# Patient Record
Sex: Male | Born: 1960 | Race: Black or African American | Hispanic: No | Marital: Married | Smoking: Current every day smoker
Health system: Southern US, Community
[De-identification: ages and names within clinical notes are randomized; demographics above are authoritative.]

## PROBLEM LIST (undated history)

## (undated) DIAGNOSIS — F39 Unspecified mood [affective] disorder: Secondary | ICD-10-CM

## (undated) DIAGNOSIS — F1411 Cocaine abuse, in remission: Secondary | ICD-10-CM

## (undated) DIAGNOSIS — I209 Angina pectoris, unspecified: Secondary | ICD-10-CM

## (undated) DIAGNOSIS — B1910 Unspecified viral hepatitis B without hepatic coma: Secondary | ICD-10-CM

## (undated) DIAGNOSIS — I10 Essential (primary) hypertension: Secondary | ICD-10-CM

## (undated) DIAGNOSIS — F1011 Alcohol abuse, in remission: Secondary | ICD-10-CM

## (undated) DIAGNOSIS — I639 Cerebral infarction, unspecified: Secondary | ICD-10-CM

## (undated) DIAGNOSIS — E7800 Pure hypercholesterolemia, unspecified: Secondary | ICD-10-CM

## (undated) DIAGNOSIS — Z720 Tobacco use: Secondary | ICD-10-CM

---

## 1989-09-11 DIAGNOSIS — B1910 Unspecified viral hepatitis B without hepatic coma: Secondary | ICD-10-CM

## 1989-09-11 HISTORY — DX: Unspecified viral hepatitis B without hepatic coma: B19.10

## 1999-04-28 ENCOUNTER — Encounter: Payer: Self-pay | Admitting: Emergency Medicine

## 1999-04-28 ENCOUNTER — Emergency Department (HOSPITAL_COMMUNITY): Admission: EM | Admit: 1999-04-28 | Discharge: 1999-04-28 | Payer: Self-pay | Admitting: Emergency Medicine

## 1999-10-30 ENCOUNTER — Emergency Department (HOSPITAL_COMMUNITY): Admission: EM | Admit: 1999-10-30 | Discharge: 1999-10-30 | Payer: Self-pay | Admitting: Emergency Medicine

## 1999-10-30 ENCOUNTER — Encounter: Payer: Self-pay | Admitting: Emergency Medicine

## 2000-03-10 ENCOUNTER — Encounter: Payer: Self-pay | Admitting: Emergency Medicine

## 2000-03-10 ENCOUNTER — Emergency Department (HOSPITAL_COMMUNITY): Admission: EM | Admit: 2000-03-10 | Discharge: 2000-03-10 | Payer: Self-pay | Admitting: Emergency Medicine

## 2000-03-21 ENCOUNTER — Encounter: Payer: Self-pay | Admitting: Emergency Medicine

## 2000-03-21 ENCOUNTER — Inpatient Hospital Stay (HOSPITAL_COMMUNITY): Admission: EM | Admit: 2000-03-21 | Discharge: 2000-03-22 | Payer: Self-pay | Admitting: Emergency Medicine

## 2003-05-21 ENCOUNTER — Ambulatory Visit (HOSPITAL_COMMUNITY): Admission: RE | Admit: 2003-05-21 | Discharge: 2003-05-21 | Payer: Self-pay | Admitting: Internal Medicine

## 2003-05-21 ENCOUNTER — Encounter: Payer: Self-pay | Admitting: Internal Medicine

## 2004-01-13 ENCOUNTER — Emergency Department (HOSPITAL_COMMUNITY): Admission: EM | Admit: 2004-01-13 | Discharge: 2004-01-13 | Payer: Self-pay | Admitting: Emergency Medicine

## 2005-05-08 ENCOUNTER — Emergency Department (HOSPITAL_COMMUNITY): Admission: EM | Admit: 2005-05-08 | Discharge: 2005-05-09 | Payer: Self-pay | Admitting: Emergency Medicine

## 2005-09-15 ENCOUNTER — Emergency Department (HOSPITAL_COMMUNITY): Admission: EM | Admit: 2005-09-15 | Discharge: 2005-09-15 | Payer: Self-pay | Admitting: Emergency Medicine

## 2005-10-06 ENCOUNTER — Inpatient Hospital Stay (HOSPITAL_COMMUNITY): Admission: AC | Admit: 2005-10-06 | Discharge: 2005-10-10 | Payer: Self-pay

## 2005-10-13 ENCOUNTER — Emergency Department (HOSPITAL_COMMUNITY): Admission: EM | Admit: 2005-10-13 | Discharge: 2005-10-13 | Payer: Self-pay | Admitting: Emergency Medicine

## 2005-11-24 ENCOUNTER — Inpatient Hospital Stay (HOSPITAL_COMMUNITY): Admission: RE | Admit: 2005-11-24 | Discharge: 2005-11-27 | Payer: Self-pay | Admitting: Psychiatry

## 2005-11-25 ENCOUNTER — Ambulatory Visit: Payer: Self-pay | Admitting: Psychiatry

## 2006-08-02 ENCOUNTER — Emergency Department (HOSPITAL_COMMUNITY): Admission: EM | Admit: 2006-08-02 | Discharge: 2006-08-03 | Payer: Self-pay | Admitting: Emergency Medicine

## 2006-09-05 ENCOUNTER — Emergency Department (HOSPITAL_COMMUNITY): Admission: EM | Admit: 2006-09-05 | Discharge: 2006-09-06 | Payer: Self-pay | Admitting: Emergency Medicine

## 2007-04-04 ENCOUNTER — Emergency Department (HOSPITAL_COMMUNITY): Admission: EM | Admit: 2007-04-04 | Discharge: 2007-04-04 | Payer: Self-pay | Admitting: Emergency Medicine

## 2007-10-27 ENCOUNTER — Emergency Department (HOSPITAL_COMMUNITY): Admission: EM | Admit: 2007-10-27 | Discharge: 2007-10-27 | Payer: Self-pay | Admitting: Emergency Medicine

## 2007-10-30 ENCOUNTER — Encounter: Admission: RE | Admit: 2007-10-30 | Discharge: 2007-11-27 | Payer: Self-pay | Admitting: Psychiatry

## 2009-04-04 ENCOUNTER — Emergency Department (HOSPITAL_COMMUNITY): Admission: EM | Admit: 2009-04-04 | Discharge: 2009-04-04 | Payer: Self-pay | Admitting: Emergency Medicine

## 2009-09-11 HISTORY — PX: MOUTH SURGERY: SHX715

## 2010-04-06 ENCOUNTER — Inpatient Hospital Stay (HOSPITAL_COMMUNITY): Admission: AD | Admit: 2010-04-06 | Discharge: 2010-04-09 | Payer: Self-pay | Admitting: Psychiatry

## 2010-04-06 ENCOUNTER — Ambulatory Visit: Payer: Self-pay | Admitting: Psychiatry

## 2010-04-06 ENCOUNTER — Emergency Department (HOSPITAL_COMMUNITY): Admission: EM | Admit: 2010-04-06 | Discharge: 2010-04-06 | Payer: Self-pay | Admitting: Emergency Medicine

## 2010-11-26 LAB — HEPATIC FUNCTION PANEL
ALT: 19 U/L (ref 0–53)
Alkaline Phosphatase: 64 U/L (ref 39–117)
Total Protein: 7.5 g/dL (ref 6.0–8.3)

## 2010-11-26 LAB — CBC
MCHC: 35 g/dL (ref 30.0–36.0)
WBC: 9.3 10*3/uL (ref 4.0–10.5)

## 2010-11-26 LAB — URINALYSIS, ROUTINE W REFLEX MICROSCOPIC
Bilirubin Urine: NEGATIVE
Hgb urine dipstick: NEGATIVE
Ketones, ur: NEGATIVE mg/dL
Protein, ur: NEGATIVE mg/dL
Urobilinogen, UA: 0.2 mg/dL (ref 0.0–1.0)
pH: 7 (ref 5.0–8.0)

## 2010-11-26 LAB — DIFFERENTIAL
Basophils Relative: 0 % (ref 0–1)
Eosinophils Absolute: 0 10*3/uL (ref 0.0–0.7)
Eosinophils Relative: 0 % (ref 0–5)
Lymphocytes Relative: 19 % (ref 12–46)
Monocytes Absolute: 0.4 10*3/uL (ref 0.1–1.0)
Neutro Abs: 7.1 10*3/uL (ref 1.7–7.7)
Neutrophils Relative %: 76 % (ref 43–77)

## 2010-11-26 LAB — BASIC METABOLIC PANEL
CO2: 25 mEq/L (ref 19–32)
Chloride: 104 mEq/L (ref 96–112)
GFR calc non Af Amer: >60 mL/min (ref >60)
Glucose, Bld: 96 mg/dL (ref 70–99)
Potassium: 4 mEq/L (ref 3.5–5.1)
Sodium: 138 mEq/L (ref 135–145)

## 2010-11-26 LAB — RAPID URINE DRUG SCREEN, HOSP PERFORMED
Cocaine: POSITIVE — AB
Opiates: NOT DETECTED
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
## 2012-03-27 ENCOUNTER — Encounter (HOSPITAL_COMMUNITY): Payer: Self-pay | Admitting: Emergency Medicine

## 2012-03-27 ENCOUNTER — Encounter (HOSPITAL_COMMUNITY): Payer: Self-pay | Admitting: *Deleted

## 2012-03-27 ENCOUNTER — Emergency Department (INDEPENDENT_AMBULATORY_CARE_PROVIDER_SITE_OTHER)
Admission: EM | Admit: 2012-03-27 | Discharge: 2012-03-27 | Disposition: A | Discharge status: Nursing Home (Includes ICF) | Payer: Self-pay | Source: Home / Self Care

## 2012-03-27 ENCOUNTER — Emergency Department (HOSPITAL_COMMUNITY)
Admission: EM | Admit: 2012-03-27 | Discharge: 2012-03-28 | Disposition: A | Discharge status: Home / Self Care | Payer: Self-pay | Attending: Emergency Medicine | Admitting: Emergency Medicine

## 2012-03-27 DIAGNOSIS — F17200 Nicotine dependence, unspecified, uncomplicated: Secondary | ICD-10-CM | POA: Insufficient documentation

## 2012-03-27 DIAGNOSIS — R531 Weakness: Secondary | ICD-10-CM

## 2012-03-27 DIAGNOSIS — I10 Essential (primary) hypertension: Secondary | ICD-10-CM | POA: Insufficient documentation

## 2012-03-27 DIAGNOSIS — R51 Headache: Secondary | ICD-10-CM | POA: Insufficient documentation

## 2012-03-27 HISTORY — DX: Essential (primary) hypertension: I10

## 2012-03-27 NOTE — ED Provider Notes (Signed)
History     CSN: [id number]  Arrival date & time 03/27/12  1421   None     Chief Complaint  Patient presents with  . Fatigue    (Consider location/radiation/quality/duration/timing/severity/associated sxs/prior treatment) The history is provided by the patient.  Patient complains of a 3 day history of bilateral temporal headache. Character:  Throbbing Location:  Aggravating activities:  none Alleviating activities:  Supine, quiet No history of injury.  Not worst headache of life.  Does not awaken from sleep.  No radiation, + dizziness,  -syncope, -LOC, -diplopia, -loss of vision, aura, photophobia, but does report blurred vision.  +nausea/vomiting  No extremity weakness, +numbness down right arm, -paresthesias, +fatigue.  Reports hx of hypertension for greater than ten years, has not been on medication in five years, took blood pressure over the weekend it and it was 180's/100's  Past Medical History  Diagnosis Date  . Hypertension     History reviewed. No pertinent past surgical history.  No family history on file.  History  Substance Use Topics  . Smoking status: Current Everyday Smoker  . Smokeless tobacco: Not on file  . Alcohol Use: Yes      Review of Systems  All other systems reviewed and are negative.    Allergies  Review of patient's allergies indicates no known allergies.  Home Medications  No current outpatient prescriptions on file.  BP 155/95  Pulse 71  Temp 98.5 F (36.9 C) (Oral)  Resp 16  SpO2 98%  Physical Exam  Nursing note and vitals reviewed. Constitutional: He is oriented to person, place, and time. Vital signs are normal. He appears well-developed and well-nourished. He is active and cooperative.  HENT:  Head: Normocephalic.  Eyes: Conjunctivae and EOM are normal. Pupils are equal, round, and reactive to light. No scleral icterus.  Neck: Trachea normal. Neck supple.  Cardiovascular: Normal rate, regular rhythm and normal pulses.    Murmur heard. Pulmonary/Chest: Effort normal and breath sounds normal.  Abdominal: Soft. Normal appearance and bowel sounds are normal. There is no tenderness.  Musculoskeletal:       Cervical back: Normal.  Lymphadenopathy:    He has no cervical adenopathy.  Neurological: He is alert and oriented to person, place, and time. He has normal strength. No cranial nerve deficit or sensory deficit. He displays a negative Romberg sign. GCS eye subscore is 4. GCS verbal subscore is 5. GCS motor subscore is 6.  Skin: Skin is warm and dry.  Psychiatric: He has a normal mood and affect. His speech is normal and behavior is normal. Judgment and thought content normal. Cognition and memory are normal.    ED Course  Procedures (including critical care time)  Labs Reviewed - No data to display No results found.   1. Headache       MDM  Transfer to [facility] for further evaluation and treatment of headache, dizziness, fatigue and n/v.        Johnsie Kindred, NP 03/27/12 1745

## 2012-03-27 NOTE — ED Notes (Signed)
Pt. Called for re assessment of vitals, no answer.

## 2012-03-27 NOTE — ED Notes (Signed)
PT reports a several week HX of HA and dizziness. Pt reports he has  Not passed out.. Pt denies vision changes.

## 2012-03-27 NOTE — ED Notes (Addendum)
Pt. C/o of headaches and dizziness for 2 weeks. Reports nv, diaphoresis blurred vision (has new eye wear). Also reports has had some chest tightness sometimes but currently does not have any chest pain. Hx of htn; has  Not had meds for over 1 year. Headache pain 6/10. Pt. Is very lethargic.
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
## 2012-03-27 NOTE — ED Provider Notes (Signed)
Medical screening examination/treatment/procedure(s) were performed by non-physician practitioner and as supervising physician I was immediately available for consultation/collaboration.  Raynald Blend, MD 03/27/12 2138

## 2012-03-28 ENCOUNTER — Encounter (HOSPITAL_COMMUNITY): Payer: Self-pay | Admitting: Radiology

## 2012-03-28 ENCOUNTER — Emergency Department (HOSPITAL_COMMUNITY): Payer: Self-pay

## 2012-03-28 LAB — CBC
HCT: 41.8 % (ref 39.0–52.0)
Hemoglobin: 14.4 g/dL (ref 13.0–17.0)
MCH: 31.9 pg (ref 26.0–34.0)
MCHC: 34.4 g/dL (ref 30.0–36.0)
MCV: 92.7 fL (ref 78.0–100.0)
Platelets: 228 K/uL (ref 150–400)
RBC: 4.51 MIL/uL (ref 4.22–5.81)
RDW: 13.6 % (ref 11.5–15.5)
WBC: 10.9 K/uL — ABNORMAL HIGH (ref 4.0–10.5)

## 2012-03-28 LAB — COMPREHENSIVE METABOLIC PANEL WITH GFR
ALT: 13 U/L (ref 0–53)
AST: 23 U/L (ref 0–37)
Albumin: 3.8 g/dL (ref 3.5–5.2)
Alkaline Phosphatase: 65 U/L (ref 39–117)
BUN: 9 mg/dL (ref 6–23)
CO2: 28 meq/L (ref 19–32)
Calcium: 9.6 mg/dL (ref 8.4–10.5)
Chloride: 101 meq/L (ref 96–112)
Creatinine, Ser: 0.98 mg/dL (ref 0.50–1.35)
GFR calc Af Amer: >90 mL/min
GFR calc non Af Amer: >90 mL/min
Glucose, Bld: 104 mg/dL — ABNORMAL HIGH (ref 70–99)
Potassium: 3.6 meq/L (ref 3.5–5.1)
Sodium: 139 meq/L (ref 135–145)
Total Bilirubin: 0.5 mg/dL (ref 0.3–1.2)
Total Protein: 7.6 g/dL (ref 6.0–8.3)

## 2012-03-28 LAB — RAPID URINE DRUG SCREEN, HOSP PERFORMED
Amphetamines: NOT DETECTED
Barbiturates: NOT DETECTED
Benzodiazepines: NOT DETECTED
Cocaine: POSITIVE — AB
Opiates: NOT DETECTED
Tetrahydrocannabinol: POSITIVE — AB

## 2012-03-28 MED ORDER — LISINOPRIL 20 MG PO TABS: 10.0000 mg | ORAL_TABLET | Freq: Every day | ORAL | Status: DC

## 2012-03-28 NOTE — ED Notes (Signed)
Pt. Returned from ct.

## 2012-03-28 NOTE — ED Provider Notes (Signed)
History     CSN: [id number]  Arrival date & time 03/27/12  1803   First MD Initiated Contact with Patient 03/28/12 0004      Chief Complaint  Patient presents with  . Headache  . Dizziness    (Consider location/radiation/quality/duration/timing/severity/associated sxs/prior treatment) Patient is a 51 y.o. male presenting with headaches. The history is provided by the patient.  Headache  This is a new problem. The current episode started 2 days ago. The headache is associated with nothing. The pain is located in the bilateral region. The quality of the pain is described as dull. The pain is at a severity of 2/10. The pain is mild. The pain does not radiate. Associated symptoms include malaise/fatigue. He has tried nothing for the symptoms.    Past Medical History  Diagnosis Date  . Hypertension     History reviewed. No pertinent past surgical history.  No family history on file.  History  Substance Use Topics  . Smoking status: Current Everyday Smoker  . Smokeless tobacco: Not on file  . Alcohol Use: Yes      Review of Systems  Constitutional: Positive for malaise/fatigue.  Neurological: Positive for headaches.  All other systems reviewed and are negative.    Allergies  Review of patient's allergies indicates no known allergies.  Home Medications  No current outpatient prescriptions on file.  BP 142/87  Pulse 58  Temp 98.4 F (36.9 C) (Oral)  Resp 14  SpO2 95%  Physical Exam  Constitutional: He is oriented to person, place, and time. He appears well-developed and well-nourished.  HENT:  Head: Normocephalic and atraumatic.  Eyes: Conjunctivae are normal. Pupils are equal, round, and reactive to light.  Neck: Normal range of motion. Neck supple.       No meningismus   Cardiovascular: Normal rate, regular rhythm, normal heart sounds and intact distal pulses.   Pulmonary/Chest: Effort normal and breath sounds normal.  Abdominal: Soft. Bowel sounds are  normal.  Neurological: He is alert and oriented to person, place, and time.  Skin: Skin is warm and dry.  Psychiatric: He has a normal mood and affect. His behavior is normal. Judgment and thought content normal.    ED Course  Procedures (including critical care time)   Labs Reviewed  CBC  COMPREHENSIVE METABOLIC PANEL  URINE RAPID DRUG SCREEN (HOSP PERFORMED)   No results found.   No diagnosis found.    MDM  Improved.  Ct head labs benign.  Will dc to fu ret new/worsening sxs        Mariadejesus Cade Lytle Michaels, MD 03/28/12 [phone]

## 2012-03-28 NOTE — ED Notes (Signed)
Patient arrived to room.

## 2012-03-28 NOTE — ED Notes (Signed)
Pt to ct 

## 2012-07-02 ENCOUNTER — Emergency Department (HOSPITAL_COMMUNITY)
Admission: EM | Admit: 2012-07-02 | Discharge: 2012-07-02 | Disposition: A | Discharge status: Home / Self Care | Payer: Self-pay | Attending: Emergency Medicine | Admitting: Emergency Medicine

## 2012-07-02 ENCOUNTER — Encounter (HOSPITAL_COMMUNITY): Payer: Self-pay | Admitting: *Deleted

## 2012-07-02 ENCOUNTER — Emergency Department (HOSPITAL_COMMUNITY): Payer: Self-pay

## 2012-07-02 DIAGNOSIS — F17200 Nicotine dependence, unspecified, uncomplicated: Secondary | ICD-10-CM | POA: Insufficient documentation

## 2012-07-02 DIAGNOSIS — R197 Diarrhea, unspecified: Secondary | ICD-10-CM | POA: Insufficient documentation

## 2012-07-02 DIAGNOSIS — I10 Essential (primary) hypertension: Secondary | ICD-10-CM | POA: Insufficient documentation

## 2012-07-02 DIAGNOSIS — Z79899 Other long term (current) drug therapy: Secondary | ICD-10-CM | POA: Insufficient documentation

## 2012-07-02 DIAGNOSIS — R109 Unspecified abdominal pain: Secondary | ICD-10-CM | POA: Insufficient documentation

## 2012-07-02 LAB — URINALYSIS, ROUTINE W REFLEX MICROSCOPIC
Glucose, UA: NEGATIVE mg/dL
Hgb urine dipstick: NEGATIVE
Leukocytes, UA: NEGATIVE
Specific Gravity, Urine: 1.019 (ref 1.005–1.030)
Urobilinogen, UA: 1 mg/dL (ref 0.0–1.0)

## 2012-07-02 LAB — CBC WITH DIFFERENTIAL/PLATELET
Basophils Absolute: 0 10*3/uL (ref 0.0–0.1)
Eosinophils Relative: 0 % (ref 0–5)
HCT: 44.5 % (ref 39.0–52.0)
Lymphocytes Relative: 19 % (ref 12–46)
Lymphs Abs: 2.2 10*3/uL (ref 0.7–4.0)
MCV: 92.5 fL (ref 78.0–100.0)
Neutro Abs: 8.4 10*3/uL — ABNORMAL HIGH (ref 1.7–7.7)
Platelets: 257 10*3/uL (ref 150–400)
RBC: 4.81 MIL/uL (ref 4.22–5.81)
WBC: 11.4 10*3/uL — ABNORMAL HIGH (ref 4.0–10.5)

## 2012-07-02 LAB — BASIC METABOLIC PANEL
CO2: 26 mEq/L (ref 19–32)
Calcium: 9.8 mg/dL (ref 8.4–10.5)
Chloride: 99 mEq/L (ref 96–112)
Glucose, Bld: 95 mg/dL (ref 70–99)
Sodium: 136 mEq/L (ref 135–145)

## 2012-07-02 MED ORDER — ONDANSETRON 4 MG PO TBDP
4.0000 mg | ORAL_TABLET | Freq: Once | ORAL | Status: AC
  Administered 2012-07-02: 4 mg via ORAL
  Filled 2012-07-02: qty 1

## 2012-07-02 NOTE — ED Provider Notes (Addendum)
History     CSN: [id number]  Arrival date & time 07/02/12  1311   First MD Initiated Contact with Patient 07/02/12 1822      Chief Complaint  Patient presents with  . Abdominal Pain    (Consider location/radiation/quality/duration/timing/severity/associated sxs/prior treatment) Patient is a 51 y.o. male presenting with abdominal pain. The history is provided by the patient.  Abdominal Pain The primary symptoms of the illness include abdominal pain and diarrhea. The primary symptoms of the illness do not include nausea or vomiting. The current episode started 6 to 12 hours ago. The onset of the illness was sudden. Progression since onset: intermittent.  Progression: comes and goes. The abdominal pain is located in the LLQ and RLQ (abdominal cramps over the lower abdomen). The abdominal pain does not radiate. The severity of the abdominal pain is 6/10. The abdominal pain is relieved by nothing. The abdominal pain is exacerbated by eating.  The diarrhea began today. Diarrhea characteristics: loose. The diarrhea occurs once per day. Risk factors for illness producing diarrhea include suspect food intake.  Additional symptoms associated with the illness include anorexia. Symptoms associated with the illness do not include chills, constipation, urgency, frequency or back pain.    Past Medical History  Diagnosis Date  . Hypertension     History reviewed. No pertinent past surgical history.  No family history on file.  History  Substance Use Topics  . Smoking status: Current Every Day Smoker  . Smokeless tobacco: Not on file  . Alcohol Use: Yes      Review of Systems  Constitutional: Negative for chills.  Gastrointestinal: Positive for abdominal pain, diarrhea and anorexia. Negative for nausea, vomiting and constipation.  Genitourinary: Negative for urgency and frequency.  Musculoskeletal: Negative for back pain.  All other systems reviewed and are negative.    Allergies    Review of patient's allergies indicates no known allergies.  Home Medications   Current Outpatient Rx  Name Route Sig Dispense Refill  . IBUPROFEN 200 MG PO TABS Oral Take 400 mg by mouth every 6 (six) hours as needed. For pain    . LISINOPRIL 5 MG PO TABS Oral Take 5 mg by mouth daily.      BP 162/99  Pulse 53  Temp 98.3 F (36.8 C) (Oral)  Resp 19  SpO2 98%  Physical Exam  Nursing note and vitals reviewed. Constitutional: He is oriented to person, place, and time. He appears well-developed and well-nourished. No distress.  HENT:  Head: Normocephalic and atraumatic.  Mouth/Throat: Oropharynx is clear and moist.  Eyes: Conjunctivae normal and EOM are normal. Pupils are equal, round, and reactive to light.  Neck: Normal range of motion. Neck supple.  Cardiovascular: Normal rate, regular rhythm and intact distal pulses.   No murmur heard. Pulmonary/Chest: Effort normal and breath sounds normal. No respiratory distress. He has no wheezes. He has no rales.  Abdominal: Soft. Normal appearance. He exhibits no distension. There is no tenderness. There is no rebound, no guarding and no CVA tenderness.  Musculoskeletal: Normal range of motion. He exhibits no edema and no tenderness.  Neurological: He is alert and oriented to person, place, and time.  Skin: Skin is warm and dry. No rash noted. No erythema.  Psychiatric: He has a normal mood and affect. His behavior is normal.    ED Course  Procedures (including critical care time)  Labs Reviewed  CBC WITH DIFFERENTIAL - Abnormal; Notable for the following:    WBC 11.4 (*)  Neutro Abs 8.4 (*)     All other components within normal limits  BASIC METABOLIC PANEL - Abnormal; Notable for the following:    GFR calc non Af Amer 86 (*)     All other components within normal limits  URINALYSIS, ROUTINE W REFLEX MICROSCOPIC   Dg Abd Acute W/chest  07/02/2012  *RADIOLOGY REPORT*  Clinical Data: Abdominal pain.  ACUTE ABDOMEN SERIES  (ABDOMEN 2 VIEW & CHEST 1 VIEW)  Comparison:  None.  Findings:  There is no evidence of dilated bowel loops or free intraperitoneal air.  No radiopaque calculi or other significant radiographic abnormality is seen. Heart size and mediastinal contours are within normal limits.  Both lungs are clear.  IMPRESSION: Negative abdominal radiographs.  No acute cardiopulmonary disease.   Original Report Authenticated By: Danae Orleans, M.D.      No diagnosis found.    MDM   Patient with a history of intermittent abdominal cramping today in one loose stool. He states that 2 days ago he hot dogs from a gas station that tasted a little bit funny but also has had coworkers with similar GI symptoms. He denies fever, nausea or vomiting but states he does not have an appetite. On exam patient has no focal abdominal tenderness suggestive of diverticulitis, appendicitis, pancreatitis or cholecystitis. His CBC, CMP are within normal limits in x-ray shows no signs of constipation. Patient was given Zofran and is tolerating by mouth's without difficulty. Feel that this is most likely viral in origin and given strict return precautions. Based on patient's history and physical exam do not feel that a CT is warranted at this time.  7:55 PM Pt tolerating po's and will d/c home.      Gwyneth Sprout, MD 07/02/12 4540  Gwyneth Sprout, MD 07/02/12 9811

## 2012-07-02 NOTE — ED Notes (Signed)
PT states abdominal cramping that started at 0500 after having a bowel movement.  No vomiting

## 2012-07-02 NOTE — ED Notes (Signed)
Patient transported to X-ray 

## 2012-08-05 ENCOUNTER — Inpatient Hospital Stay (HOSPITAL_COMMUNITY)
Admission: EM | Admit: 2012-08-05 | Discharge: 2012-08-07 | DRG: 065 | Disposition: A | Discharge status: Home Health | Payer: MEDICAID | Attending: Internal Medicine | Admitting: Internal Medicine

## 2012-08-05 ENCOUNTER — Inpatient Hospital Stay (HOSPITAL_COMMUNITY): Payer: Self-pay

## 2012-08-05 ENCOUNTER — Emergency Department (HOSPITAL_COMMUNITY): Payer: Self-pay

## 2012-08-05 ENCOUNTER — Encounter (HOSPITAL_COMMUNITY): Payer: Self-pay | Admitting: Cardiology

## 2012-08-05 DIAGNOSIS — F1411 Cocaine abuse, in remission: Secondary | ICD-10-CM | POA: Diagnosis present

## 2012-08-05 DIAGNOSIS — Z720 Tobacco use: Secondary | ICD-10-CM | POA: Diagnosis present

## 2012-08-05 DIAGNOSIS — R0789 Other chest pain: Secondary | ICD-10-CM | POA: Diagnosis present

## 2012-08-05 DIAGNOSIS — F1011 Alcohol abuse, in remission: Secondary | ICD-10-CM

## 2012-08-05 DIAGNOSIS — R51 Headache: Secondary | ICD-10-CM

## 2012-08-05 DIAGNOSIS — R079 Chest pain, unspecified: Secondary | ICD-10-CM

## 2012-08-05 DIAGNOSIS — F1010 Alcohol abuse, uncomplicated: Secondary | ICD-10-CM | POA: Diagnosis present

## 2012-08-05 DIAGNOSIS — I10 Essential (primary) hypertension: Secondary | ICD-10-CM | POA: Diagnosis present

## 2012-08-05 DIAGNOSIS — I639 Cerebral infarction, unspecified: Secondary | ICD-10-CM

## 2012-08-05 DIAGNOSIS — E871 Hypo-osmolality and hyponatremia: Secondary | ICD-10-CM | POA: Diagnosis present

## 2012-08-05 DIAGNOSIS — I6340 Cerebral infarction due to embolism of unspecified cerebral artery: Principal | ICD-10-CM | POA: Diagnosis present

## 2012-08-05 DIAGNOSIS — Z8249 Family history of ischemic heart disease and other diseases of the circulatory system: Secondary | ICD-10-CM

## 2012-08-05 DIAGNOSIS — F17200 Nicotine dependence, unspecified, uncomplicated: Secondary | ICD-10-CM | POA: Diagnosis present

## 2012-08-05 DIAGNOSIS — E785 Hyperlipidemia, unspecified: Secondary | ICD-10-CM | POA: Diagnosis present

## 2012-08-05 DIAGNOSIS — F39 Unspecified mood [affective] disorder: Secondary | ICD-10-CM | POA: Diagnosis present

## 2012-08-05 DIAGNOSIS — F1410 Cocaine abuse, uncomplicated: Secondary | ICD-10-CM | POA: Diagnosis present

## 2012-08-05 HISTORY — DX: Cerebral infarction, unspecified: I63.9

## 2012-08-05 HISTORY — DX: Alcohol abuse, in remission: F10.11

## 2012-08-05 HISTORY — DX: Angina pectoris, unspecified: I20.9

## 2012-08-05 HISTORY — DX: Unspecified mood (affective) disorder: F39

## 2012-08-05 HISTORY — DX: Cocaine abuse, in remission: F14.11

## 2012-08-05 HISTORY — DX: Pure hypercholesterolemia, unspecified: E78.00

## 2012-08-05 HISTORY — DX: Tobacco use: Z72.0

## 2012-08-05 HISTORY — DX: Unspecified viral hepatitis B without hepatic coma: B19.10

## 2012-08-05 LAB — CBC WITH DIFFERENTIAL/PLATELET
Eosinophils Absolute: 0 10*3/uL (ref 0.0–0.7)
Eosinophils Relative: 0 % (ref 0–5)
HCT: 42.8 % (ref 39.0–52.0)
Hemoglobin: 15.1 g/dL (ref 13.0–17.0)
Lymphocytes Relative: 21 % (ref 12–46)
Lymphs Abs: 2.5 10*3/uL (ref 0.7–4.0)
MCH: 31.7 pg (ref 26.0–34.0)
MCV: 89.7 fL (ref 78.0–100.0)
Monocytes Absolute: 0.7 10*3/uL (ref 0.1–1.0)
Monocytes Relative: 6 % (ref 3–12)
RBC: 4.77 MIL/uL (ref 4.22–5.81)
WBC: 12.2 10*3/uL — ABNORMAL HIGH (ref 4.0–10.5)

## 2012-08-05 LAB — URINALYSIS, ROUTINE W REFLEX MICROSCOPIC
Bilirubin Urine: NEGATIVE
Hgb urine dipstick: NEGATIVE
Nitrite: NEGATIVE
Protein, ur: NEGATIVE mg/dL
Specific Gravity, Urine: 1.015 (ref 1.005–1.030)
Urobilinogen, UA: 0.2 mg/dL (ref 0.0–1.0)

## 2012-08-05 LAB — COMPREHENSIVE METABOLIC PANEL
ALT: 15 U/L (ref 0–53)
BUN: 10 mg/dL (ref 6–23)
CO2: 25 mEq/L (ref 19–32)
Calcium: 9.6 mg/dL (ref 8.4–10.5)
GFR calc Af Amer: >90 mL/min (ref >90)
GFR calc non Af Amer: 83 mL/min — ABNORMAL LOW (ref >90)
Glucose, Bld: 122 mg/dL — ABNORMAL HIGH (ref 70–99)
Total Protein: 7.8 g/dL (ref 6.0–8.3)

## 2012-08-05 LAB — CBC
HCT: 42.3 % (ref 39.0–52.0)
Hemoglobin: 14.7 g/dL (ref 13.0–17.0)
MCH: 32 pg (ref 26.0–34.0)
MCHC: 34.8 g/dL (ref 30.0–36.0)

## 2012-08-05 LAB — LIPID PANEL
Cholesterol: 164 mg/dL (ref 0–200)
Triglycerides: 125 mg/dL (ref <150)
VLDL: 25 mg/dL (ref 0–40)

## 2012-08-05 LAB — CREATININE, SERUM: GFR calc non Af Amer: >90 mL/min (ref >90)

## 2012-08-05 LAB — LIPASE, BLOOD: Lipase: 30 U/L (ref 11–59)

## 2012-08-05 LAB — TROPONIN I: Troponin I: <0.3 ng/mL (ref <0.30)

## 2012-08-05 MED ORDER — SODIUM CHLORIDE 0.9 % IV BOLUS (SEPSIS)
500.0000 mL | Freq: Once | INTRAVENOUS | Status: AC
  Administered 2012-08-05: 500 mL via INTRAVENOUS

## 2012-08-05 MED ORDER — ONDANSETRON HCL 4 MG PO TABS
4.0000 mg | ORAL_TABLET | Freq: Four times a day (QID) | ORAL | Status: DC | PRN
  Administered 2012-08-05: 4 mg via ORAL
  Filled 2012-08-05: qty 1

## 2012-08-05 MED ORDER — ACETAMINOPHEN 325 MG PO TABS
650.0000 mg | ORAL_TABLET | Freq: Four times a day (QID) | ORAL | Status: DC | PRN
  Administered 2012-08-05 – 2012-08-06 (×3): 650 mg via ORAL
  Filled 2012-08-05 (×3): qty 2

## 2012-08-05 MED ORDER — ONDANSETRON HCL 4 MG/2ML IJ SOLN
4.0000 mg | INTRAMUSCULAR | Status: DC | PRN
  Administered 2012-08-05: 4 mg via INTRAVENOUS
  Filled 2012-08-05: qty 2

## 2012-08-05 MED ORDER — SODIUM CHLORIDE 0.9 % IV SOLN: INTRAVENOUS | Status: DC

## 2012-08-05 MED ORDER — SODIUM CHLORIDE 0.9 % IV SOLN
INTRAVENOUS | Status: DC
  Administered 2012-08-05: 09:00:00 via INTRAVENOUS

## 2012-08-05 MED ORDER — SODIUM CHLORIDE 0.9 % IV SOLN
INTRAVENOUS | Status: DC
  Administered 2012-08-05 – 2012-08-07 (×3): via INTRAVENOUS

## 2012-08-05 MED ORDER — ASPIRIN 300 MG RE SUPP: 300.0000 mg | Freq: Once | RECTAL | Status: DC

## 2012-08-05 MED ORDER — TRAMADOL HCL 50 MG PO TABS
50.0000 mg | ORAL_TABLET | Freq: Four times a day (QID) | ORAL | Status: DC | PRN
  Administered 2012-08-05 – 2012-08-07 (×5): 50 mg via ORAL
  Filled 2012-08-05 (×5): qty 1

## 2012-08-05 MED ORDER — ONDANSETRON HCL 4 MG/2ML IJ SOLN
4.0000 mg | Freq: Once | INTRAMUSCULAR | Status: AC
  Administered 2012-08-05: 4 mg via INTRAVENOUS
  Filled 2012-08-05: qty 2

## 2012-08-05 MED ORDER — SENNOSIDES-DOCUSATE SODIUM 8.6-50 MG PO TABS: 1.0000 | ORAL_TABLET | Freq: Every evening | ORAL | Status: DC | PRN

## 2012-08-05 MED ORDER — ENOXAPARIN SODIUM 40 MG/0.4ML ~~LOC~~ SOLN
40.0000 mg | SUBCUTANEOUS | Status: DC
  Administered 2012-08-05 – 2012-08-06 (×2): 40 mg via SUBCUTANEOUS
  Filled 2012-08-05 (×2): qty 0.4

## 2012-08-05 MED ORDER — ASPIRIN 325 MG PO TABS
325.0000 mg | ORAL_TABLET | Freq: Every day | ORAL | Status: DC
  Administered 2012-08-05 – 2012-08-07 (×3): 325 mg via ORAL
  Filled 2012-08-05 (×3): qty 1

## 2012-08-05 MED ORDER — ATORVASTATIN CALCIUM 40 MG PO TABS
40.0000 mg | ORAL_TABLET | Freq: Every day | ORAL | Status: DC
  Administered 2012-08-05 – 2012-08-06 (×2): 40 mg via ORAL
  Filled 2012-08-05 (×3): qty 1

## 2012-08-05 MED ORDER — MECLIZINE HCL 25 MG PO TABS
50.0000 mg | ORAL_TABLET | Freq: Once | ORAL | Status: AC
  Administered 2012-08-05: 50 mg via ORAL
  Filled 2012-08-05: qty 2

## 2012-08-05 NOTE — ED Provider Notes (Signed)
History     CSN: [id number]  Arrival date & time 08/05/12  8657   First MD Initiated Contact with Patient 08/05/12 [phone]      Chief Complaint  Patient presents with  . Dizziness  . Emesis    HPI Pt was seen at 0820.  Per pt, c/o gradual onset and persistence of constant "dizziness" that began 3 days ago while drinking wine with friends.  Pt describes the "dizziness" as "everything is spinning."  States the spinning worsens when he turns his head to the right.  Has been associated with N/V and bitemporal "sharp" intermittent headache. States his symptoms have mildly improved over the past few days but the feeling of "I'm walking like I'm drunk" continues.  Denies visual changes, no focal motor weakness, no tingling/numbness in extremities, no facial droop, no slurred speech, no CP/SOB, no abd pain, no back or neck pain.  Denies headache was sudden or maximal at onset.    Past Medical History  Diagnosis Date  . Hypertension     History reviewed. No pertinent past surgical history.   History  Substance Use Topics  . Smoking status: Current Every Day Smoker  . Smokeless tobacco: Not on file  . Alcohol Use: Yes      Review of Systems ROS: Statement: All systems negative except as marked or noted in the HPI; Constitutional: Negative for fever and chills. ; ; Eyes: Negative for eye pain, redness and discharge. ; ; ENMT: Negative for ear pain, hoarseness, nasal congestion, sinus pressure and sore throat. ; ; Cardiovascular: Negative for chest pain, palpitations, diaphoresis, dyspnea and peripheral edema. ; ; Respiratory: Negative for cough, wheezing and stridor. ; ; Gastrointestinal: +N/V. Negative for diarrhea, abdominal pain, blood in stool, hematemesis, jaundice and rectal bleeding. . ; ; Genitourinary: Negative for dysuria, flank pain and hematuria. ; ; Musculoskeletal: Negative for back pain and neck pain. Negative for swelling and trauma.; ; Skin: Negative for pruritus, rash,  abrasions, blisters, bruising and skin lesion.; ; Neuro: +"dizziness," ataxia, headache. Negative for  lightheadedness and neck stiffness. Negative for weakness, altered level of consciousness , altered mental status, extremity weakness, paresthesias, involuntary movement, seizure and syncope.     Allergies  Review of patient's allergies indicates no known allergies.  Home Medications   Current Outpatient Rx  Name  Route  Sig  Dispense  Refill  . IBUPROFEN 200 MG PO TABS   Oral   Take 400 mg by mouth every 6 (six) hours as needed. For pain         . LISINOPRIL 5 MG PO TABS   Oral   Take 5 mg by mouth daily.           BP 167/104  Pulse 61  Temp 97.7 F (36.5 C) (Oral)  Resp 18  SpO2 98%  Physical Exam 0825: Physical examination:  Nursing notes reviewed; Vital signs and O2 SAT reviewed;  Constitutional: Well developed, Well nourished, Well hydrated, In no acute distress; Head:  Normocephalic, atraumatic; Eyes: EOMI, PERRL, No scleral icterus; ENMT: TM's clear bilat.  +edemetous nasal turbinates bilat with clear rhinorrhea. Mouth and pharynx normal, Mucous membranes moist; Neck: Supple, Full range of motion, No lymphadenopathy; Cardiovascular: Regular rate and rhythm, No murmur, rub, or gallop; Respiratory: Breath sounds clear & equal bilaterally, No rales, rhonchi, wheezes.  Speaking full sentences with ease, Normal respiratory effort/excursion; Chest: Nontender, Movement normal; Abdomen: Soft, Nontender, Nondistended, Normal bowel sounds;; Extremities: Pulses normal, No tenderness, No edema, No calf edema  or asymmetry.; Neuro: AA&Ox3, Major CN grossly intact.  Strength 5/5 equal bilat UE's and LE's.  DTR 2/4 equal bilat UE's and LE's.  No gross sensory deficits.  Normal cerebellar testing bilat UE's (finger-nose) and LE's (heel-shin).  No pronator drift.  Speech clear.  No facial droop.  +right horizontal end gaze fatigable nystagmus..; Skin: Color normal, Warm, Dry.    ED Course    Procedures    MDM  MDM Reviewed: nursing note, vitals and previous chart Reviewed previous: CT scan and labs Interpretation: labs, x-ray, CT scan and ECG     Date: 08/05/2012  Rate: 51  Rhythm: normal sinus rhythm  QRS Axis: normal  Intervals: normal  ST/T Wave abnormalities: normal  Conduction Disutrbances:none  Narrative Interpretation:   Old EKG Reviewed: none available.  Results for orders placed during the hospital encounter of 08/05/12  CBC WITH DIFFERENTIAL      Component Value Range   WBC 12.2 (*) 4.0 - 10.5 K/uL   RBC 4.77  4.22 - 5.81 MIL/uL   Hemoglobin 15.1  13.0 - 17.0 g/dL   HCT 95.6  21.3 - 08.6 %   MCV 89.7  78.0 - 100.0 fL   MCH 31.7  26.0 - 34.0 pg   MCHC 35.3  30.0 - 36.0 g/dL   RDW 57.8  46.9 - 62.9 %   Platelets 244  150 - 400 K/uL   Neutrophils Relative 74  43 - 77 %   Neutro Abs 9.0 (*) 1.7 - 7.7 K/uL   Lymphocytes Relative 21  12 - 46 %   Lymphs Abs 2.5  0.7 - 4.0 K/uL   Monocytes Relative 6  3 - 12 %   Monocytes Absolute 0.7  0.1 - 1.0 K/uL   Eosinophils Relative 0  0 - 5 %   Eosinophils Absolute 0.0  0.0 - 0.7 K/uL   Basophils Relative 0  0 - 1 %   Basophils Absolute 0.0  0.0 - 0.1 K/uL  COMPREHENSIVE METABOLIC PANEL      Component Value Range   Sodium 136  135 - 145 mEq/L   Potassium 3.5  3.5 - 5.1 mEq/L   Chloride 98  96 - 112 mEq/L   CO2 25  19 - 32 mEq/L   Glucose, Bld 122 (*) 70 - 99 mg/dL   BUN 10  6 - 23 mg/dL   Creatinine, Ser 5.28  0.50 - 1.35 mg/dL   Calcium 9.6  8.4 - 41.3 mg/dL   Total Protein 7.8  6.0 - 8.3 g/dL   Albumin 4.0  3.5 - 5.2 g/dL   AST 24  0 - 37 U/L   ALT 15  0 - 53 U/L   Alkaline Phosphatase 60  39 - 117 U/L   Total Bilirubin 0.6  0.3 - 1.2 mg/dL   GFR calc non Af Amer 83 (*) >90 mL/min   GFR calc Af Amer >90  >90 mL/min  LIPASE, BLOOD      Component Value Range   Lipase 30  11 - 59 U/L    Dg Chest 2 View 08/05/2012  *RADIOLOGY REPORT*  Clinical Data: Nausea/vomiting, dizziness  CHEST - 2 VIEW   Comparison: 05/08/2005  Findings: Lungs are essentially clear.  No focal consolidation. No pleural effusion or pneumothorax.  Cardiomediastinal silhouette is within normal limits.  Mild degenerative changes of the visualized thoracolumbar spine.  IMPRESSION: No evidence of acute cardiopulmonary disease.   Original Report Authenticated By: Charline Bills, M.D.  Ct Head Wo Contrast 08/05/2012  *RADIOLOGY REPORT*  Clinical Data: Bilateral temporal headache, dizziness, nausea/vomiting  CT HEAD WITHOUT CONTRAST  Technique:  Contiguous axial images were obtained from the base of the skull through the vertex without contrast.  Comparison: 03/28/2012  Findings: New hypodensity in the left cerebellum (series 2/image 5), suspicious for cerebellar infarct, age indeterminate but favored to be subacute or chronic (although new from 03/28/2012).  No evidence of parenchymal hemorrhage or extra-axial fluid collection.  No mass effect or midline shift.  Basal cisterns remain patent.  Subcortical white matter and periventricular small vessel ischemic changes.  The visualized paranasal sinuses are essentially clear. The mastoid air cells are unopacified.  No evidence of calvarial fracture.  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  These results were called by telephone on 08/05/2012 at 0905 hrs to Dr. Clarene Duke, who verbally acknowledged these results.   Original Report Authenticated By: Charline Bills, M.D.      1000:  Pt not orthostatic.  No change in "dizziness" symptoms after antivert.  Pt ataxic with ambulation, neuro exam otherwise intact and unchanged.  MRI brain pending re: CT finding of subacute CVA.  Will not acutely lower BP in setting of CVA (BP 170-200/80-100).  Pt not code stroke or TPA candidate due to symptom onset 3 days ago.  Dx and testing d/w pt and family.  Questions answered.  Verb understanding, agreeable to admit.  T/C to [facility]  Resident, case discussed, including:  HPI, pertinent PM/SHx, VS/PE, dx testing, ED course and treatment:  Agreeable to admit, requests they will come to ED for eval, aware MRI pending.           Laray Anger, DO 08/07/12 1517

## 2012-08-05 NOTE — ED Notes (Signed)
Patient transported to CT 

## 2012-08-05 NOTE — Consult Note (Signed)
Referring Physician: Saralyn Pilar    Chief Complaint: Dizziness  HPI: [name] is an 51 y.o. male who was having a glass of wine at dinner and had the acute onset of severe nausea, vomiting and dizziness.  He became so dizzy that he fell out of his chair to the right side.  His symptoms improved somewhat throughout the night but he continued to have nausea and poor balance.  Along with these symptoms the patient developed a headache as well on 11/24.  Headache was occipital, bitemporal and sharp.  Patient rated his headache on 11/24 at a 10/10, now a 5/10.  There was some associated photophobia but no phonophobia.    LSN: 08/03/2012 tPA Given: No: Outside time window  Past Medical History  Diagnosis Date  . Hypertension   . Tobacco abuse   . Mood disorder     NOS. Admitted to [facility] for detox in 12/2005  . History of cocaine abuse   . History of alcohol abuse     Past Surgical History  Procedure Date  . Mouth surgery 2011    Family History  Problem Relation Age of Onset  . Hypertension Mother   . Osteoarthritis Mother   . Hyperlipidemia Mother   . Prostate cancer Father   . CAD Maternal Grandmother   . Lung cancer Paternal Grandfather     tobacco abuse   Social History:  reports that he has been smoking Cigarettes.  He has a 52.5 pack-year smoking history. He does not have any smokeless tobacco history on file. He reports that he drinks alcohol. He reports that he does not use illicit drugs.  Allergies: No Known Allergies  Medications:  I have reviewed the patient's current medications. Prior to Admission:  Prescriptions prior to admission  Medication Sig Dispense Refill  . ibuprofen (ADVIL,MOTRIN) 200 MG tablet Take 400 mg by mouth every 6 (six) hours as needed. For pain      . lisinopril (PRINIVIL,ZESTRIL) 5 MG tablet Take 5 mg by mouth daily.       Scheduled:   . aspirin  325 mg Oral Daily  . atorvastatin  40 mg Oral q1800  . enoxaparin  40 mg Subcutaneous  Q24H  . [COMPLETED] meclizine  50 mg Oral Once  . [COMPLETED] ondansetron (ZOFRAN) IV  4 mg Intravenous Once  . [COMPLETED] sodium chloride  500 mL Intravenous Once  . [DISCONTINUED] aspirin  300 mg Rectal Once    ROS: History obtained from the patient  General ROS: negative for - chills, fatigue, fever, night sweats, weight gain or weight loss Psychological ROS: negative for - behavioral disorder, hallucinations, memory difficulties, mood swings or suicidal ideation Ophthalmic ROS: negative for - blurry vision, double vision, eye pain or loss of vision ENT ROS: negative for - epistaxis, nasal discharge, oral lesions, sore throat, tinnitus or vertigo Allergy and Immunology ROS: negative for - hives or itchy/watery eyes Hematological and Lymphatic ROS: negative for - bleeding problems, bruising or swollen lymph nodes Endocrine ROS: negative for - galactorrhea, hair pattern changes, polydipsia/polyuria or temperature intolerance Respiratory ROS: negative for - cough, hemoptysis, shortness of breath or wheezing Cardiovascular ROS: occasional chest pain Gastrointestinal ROS: negative for - abdominal pain, diarrhea, hematemesis, nausea/vomiting or stool incontinence Genito-Urinary ROS: negative for - dysuria, hematuria, incontinence or urinary frequency/urgency Musculoskeletal ROS: negative for - joint swelling or muscular weakness Neurological ROS: as noted in HPI Dermatological ROS: negative for rash and skin lesion changes  Physical Examination: Blood pressure 180/100, pulse 58,  temperature 99.4 F (37.4 C), temperature source Oral, resp. rate 18, SpO2 98.00%.  Neurologic Examination: Mental Status: Alert, oriented, thought content appropriate.  Speech fluent without evidence of aphasia.  Able to follow 3 step commands without difficulty. Cranial Nerves: II: Discs flat bilaterally; Visual fields grossly normal, pupils equal, round, reactive to light and accommodation III,IV, VI:  ptosis not present, extra-ocular motions intact bilaterally V,VII: decrease in the left NLF, facial light touch sensation normal bilaterally VIII: hearing normal bilaterally IX,X: gag reflex present XI: bilateral shoulder shrug XII: midline tongue extension Motor: Right : Upper extremity   5/5    Left:     Upper extremity   5/5  Lower extremity   5/5     Lower extremity   5/5 Tone and bulk:normal tone throughout; no atrophy noted Sensory: Pinprick and light touch intact throughout, bilaterally Deep Tendon Reflexes: 2+ and symmetric with absent AJ's bilaterally Plantars: Right: downgoing   Left: downgoing Cerebellar: Mild dysmetria with finger-to-nose and heel-to-shin testing on the left Gait: not tested CV: pulses palpable throughout   Laboratory Studies:  Basic Metabolic Panel:  Lab 08/05/12 5784  NA 136  K 3.5  CL 98  CO2 25  GLUCOSE 122*  BUN 10  CREATININE 1.04  CALCIUM 9.6  MG --  PHOS --    Liver Function Tests:  Lab 08/05/12 0831  AST 24  ALT 15  ALKPHOS 60  BILITOT 0.6  PROT 7.8  ALBUMIN 4.0    Lab 08/05/12 0831  LIPASE 30  AMYLASE --   No results found for this basename: AMMONIA:3 in the last 168 hours  CBC:  Lab 08/05/12 0831  WBC 12.2*  NEUTROABS 9.0*  HGB 15.1  HCT 42.8  MCV 89.7  PLT 244    Cardiac Enzymes:  Lab 08/05/12 0839  CKTOTAL --  CKMB --  CKMBINDEX --  TROPONINI <0.30    BNP: No components found with this basename: POCBNP:5  CBG: No results found for this basename: GLUCAP:5 in the last 168 hours  Microbiology: No results found for this or any previous visit.  Coagulation Studies: No results found for this basename: LABPROT:5,INR:5 in the last 72 hours  Urinalysis:  Lab 08/05/12 1053  COLORURINE YELLOW  LABSPEC 1.015  PHURINE 7.5  GLUCOSEU NEGATIVE  HGBUR NEGATIVE  BILIRUBINUR NEGATIVE  KETONESUR NEGATIVE  PROTEINUR NEGATIVE  UROBILINOGEN 0.2  NITRITE NEGATIVE  LEUKOCYTESUR NEGATIVE    Lipid  Panel:    Component Value Date/Time   CHOL 164 08/05/2012 1124   TRIG 125 08/05/2012 1124   HDL 38* 08/05/2012 1124   CHOLHDL 4.3 08/05/2012 1124   VLDL 25 08/05/2012 1124   LDLCALC 101* 08/05/2012 1124    HgbA1C:  No results found for this basename: HGBA1C    Urine Drug Screen:     Component Value Date/Time   LABOPIA NONE DETECTED 03/28/2012 0337   COCAINSCRNUR POSITIVE* 03/28/2012 0337   LABBENZ NONE DETECTED 03/28/2012 0337   AMPHETMU NONE DETECTED 03/28/2012 0337   THCU POSITIVE* 03/28/2012 0337   LABBARB NONE DETECTED 03/28/2012 0337    Alcohol Level: No results found for this basename: ETH:2 in the last 168 hours   Imaging: Dg Chest 2 View  08/05/2012  *RADIOLOGY REPORT*  Clinical Data: Nausea/vomiting, dizziness  CHEST - 2 VIEW  Comparison: 05/08/2005  Findings: Lungs are essentially clear.  No focal consolidation. No pleural effusion or pneumothorax.  Cardiomediastinal silhouette is within normal limits.  Mild degenerative changes of the visualized thoracolumbar spine.  IMPRESSION: No evidence of acute cardiopulmonary disease.   Original Report Authenticated By: Charline Bills, M.D.    Ct Head Wo Contrast  08/05/2012  *RADIOLOGY REPORT*  Clinical Data: Bilateral temporal headache, dizziness, nausea/vomiting  CT HEAD WITHOUT CONTRAST  Technique:  Contiguous axial images were obtained from the base of the skull through the vertex without contrast.  Comparison: 03/28/2012  Findings: New hypodensity in the left cerebellum (series 2/image 5), suspicious for cerebellar infarct, age indeterminate but favored to be subacute or chronic (although new from 03/28/2012).  No evidence of parenchymal hemorrhage or extra-axial fluid collection.  No mass effect or midline shift.  Basal cisterns remain patent.  Subcortical white matter and periventricular small vessel ischemic changes.  The visualized paranasal sinuses are essentially clear. The mastoid air cells are unopacified.  No evidence of  calvarial fracture.  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  These results were called by telephone on 08/05/2012 at 0905 hrs to Dr. Clarene Duke, who verbally acknowledged these results.   Original Report Authenticated By: Charline Bills, M.D.    Mr [facility] W Contrast  08/05/2012  *RADIOLOGY REPORT*  Clinical Data:  Ataxia with nausea and vomiting.  Stroke.  MRI HEAD WITHOUT CONTRAST MRA HEAD WITHOUT CONTRAST  Technique:  Multiplanar, multiecho pulse sequences of the brain and surrounding structures were obtained without intravenous contrast. Angiographic images of the head were obtained using MRA technique without contrast.  Comparison:  CT 08/05/2012  MRI HEAD  Findings:  Acute infarct in the left mid and inferior cerebellum compatible with acute left PICA infarct.  Brainstem is not involved to a significant degree.  No other acute infarct.  Numerous hyperintensities in the subcortical and deep white matter bilaterally, most consistent with chronic small vessel ischemia. Hyperintensity in the left pons, compatible with chronic ischemia. This does not show restricted diffusion.  Negative for hemorrhage or mass lesion.  No midline shift. Negative for hydrocephalus.  IMPRESSION: Acute infarct left PICA territory.  Mild to moderate chronic microvascular ischemic changes, advanced for age.  MRA HEAD  Findings: Both vertebral arteries are patent to the basilar.  The right PICA is patent.  Left PICA not visualized.  The basilar is patent.  Superior cerebellar and posterior cerebral arteries are patent bilaterally without stenosis.  Internal carotid artery is widely patent bilaterally.  Anterior and middle cerebral arteries are patent bilaterally.  Negative for cerebral aneurysm.  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  Otherwise negative.   Original Report Authenticated By: Janeece Riggers, M.D.    Mr Brain Wo  Contrast  08/05/2012  *RADIOLOGY REPORT*  Clinical Data:  Ataxia with nausea and vomiting.  Stroke.  MRI HEAD WITHOUT CONTRAST MRA HEAD WITHOUT CONTRAST  Technique:  Multiplanar, multiecho pulse sequences of the brain and surrounding structures were obtained without intravenous contrast. Angiographic images of the head were obtained using MRA technique without contrast.  Comparison:  CT 08/05/2012  MRI HEAD  Findings:  Acute infarct in the left mid and inferior cerebellum compatible with acute left PICA infarct.  Brainstem is not involved to a significant degree.  No other acute infarct.  Numerous hyperintensities in the subcortical and deep white matter bilaterally, most consistent with chronic small vessel ischemia. Hyperintensity in the left pons, compatible with chronic ischemia. This does not show restricted diffusion.  Negative for hemorrhage or mass lesion.  No midline shift. Negative for hydrocephalus.  IMPRESSION: Acute infarct left PICA territory.  Mild to moderate  chronic microvascular ischemic changes, advanced for age.  MRA HEAD  Findings: Both vertebral arteries are patent to the basilar.  The right PICA is patent.  Left PICA not visualized.  The basilar is patent.  Superior cerebellar and posterior cerebral arteries are patent bilaterally without stenosis.  Internal carotid artery is widely patent bilaterally.  Anterior and middle cerebral arteries are patent bilaterally.  Negative for cerebral aneurysm.  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  Otherwise negative.   Original Report Authenticated By: Janeece Riggers, M.D.     Assessment: 51 y.o. male presenting with a left mid and inferior cerebellar infarct and symptoms of dizziness and headache.  MRA does not visualize the left PICA.  Patient has a history of hypertension and is not compliant with his medications.  Infarct secondary to small vessel disease and likely a consequence of his hypertension.  Further work up  recommended.   Stroke Risk Factors - hypertension, smoking and substance abuse  Plan: 1. HgbA1c, fasting lipid panel 2. PT consult, OT consult, Speech consult 3. Echocardiogram 4. Carotid dopplers 5. Prophylactic therapy-Antiplatelet med: Aspirin - dose 325mg  daily 6. Risk factor modification.  Smoking cessation counseling.   7. Telemetry monitoring 8. Frequent neuro checks 9. Medication compliance stressed 10.  Meclizine has not helped dizziness.  May want to try low dose Valium.       Thana Farr, MD [facility] [phone] 08/05/2012, 1:13 PM

## 2012-08-05 NOTE — ED Notes (Signed)
Neurology at bedside.

## 2012-08-05 NOTE — ED Notes (Signed)
Explained to Patient we need a urine sample.  Patient stated he would try later.  Patient has a urinal at bedside.

## 2012-08-05 NOTE — ED Notes (Signed)
Pt reports a headache and increased dizziness with ambulation. Pt is slightly unsteady on his feet when standing.

## 2012-08-05 NOTE — ED Notes (Signed)
Pt reports n/v/d that started last night and dizziness. Reports he feels like the room is spinning and has a sour stomach. No neuro deficits noted on arrival.

## 2012-08-05 NOTE — H&P (Signed)
Date: 08/05/2012               Patient Name:  [name] MRN: [id number]  DOB: November 15, 1960 Age / Sex: 51 y.o., male   PCP: None              Medical Service: Internal Medicine Teaching Service              Attending Physician: Dr. Kem Kays    First Contact: Dr. Garald Braver Pager: [phone]  Second Contact: Dr. Clyde Lundborg Pager: [phone]            After Hours (After 5p/  First Contact Pager: [phone]  weekends / holidays): Second Contact Pager: [phone]      Chief Complaint: gait instability, dizziness  History of Present Illness: Patient is a 51 y.o. male with a PMHx of hypertension and alcohol use, who presents to [facility] for evaluation of nausea, headache, and dysequilibrium x 1.5 days. On 11/23, patient developed sudden onset severe nausea, vomiting, dizziness, and dysequilibrium after finishing a glass of wine at dinner. He felt off-balance and fell out of his chair to the right side. The nonbilious vomiting persisted throughout the night, but resolved by 11/24 AM. However, the symptoms of nausea and dizziness have been persistent, although somewhat improved from time of onset. On 11/24 AM, the patient also began experiencing severe, sharp, intermittent bitemporal and occipital headache with initial 10/10 severity, that has now improved to 5/10. He describes associated photophobia, but denies vision loss or acute vision change. During the longevity of his constellation of his symptoms, the patient did not experience focal weakness, ongoing slurred speech, vision loss, facial droop.   ROS reveals that the patient has also been experiencing intermittent chest pain after exertion for the last approximately 3 months, Described as a sharp central chest pain without radiation and without associated nausea, vomiting, shortness of breath.   During the ER course, the patient was initially treated with meclizine without improvement of symptoms. CT head shows area of suspected left cerebellar infarct of  unclear acuity (although notably new from last head CT in 03/2012).    Review of Systems: Constitutional:  admits to chills, diaphoresis, confirms inconsistent appetite. Denies confirmed fever, fatigue.  HEENT: Admits to nasal congestion and drainage, neck pain. Denies eye pain, redness, hearing loss, ear pain, sore throat, sneezing, tinnitus.  Respiratory: Denies SOB, DOE, cough, chest tightness, and wheezing.  Cardiovascular: Admits intermittent central chest pain x 3 months can occur with rest or exertion, palpitations and leg swelling.  Gastrointestinal: admits to nausea, vomiting, diarrhea. Denies constipation, blood in stool.  Genitourinary: admits to increased frequency and urgency x 1 week. Denies dysuria, hematuria, flank pain and difficulty urinating.  Musculoskeletal: admits to neck pain x 1-2 weeks, ataxic gait. Denies myalgias, back pain, joint swelling, arthralgias.  Skin: denies pallor, rash and wound.  Neurological: admits to dizziness, headaches. Denies seizures, syncope, weakness, light-headedness, numbness or paresthesias.  Hematological: denies adenopathy, easy bruising, personal or family bleeding history.  Psychiatric/ Behavioral: admits to intense dreams recently. Denies suicidal ideation, mood changes, confusion, nervousness, sleep disturbance and agitation.    Current Medications: ER Administered Medications: Medication Dose  . 0.9 %  sodium chloride infusion    . acetaminophen (TYLENOL) tablet 650 mg  650 mg  . aspirin tablet 325 mg  325 mg  . [COMPLETED] meclizine (ANTIVERT) tablet 50 mg  50 mg  . ondansetron (ZOFRAN) injection 4 mg  4 mg  . [COMPLETED] ondansetron (ZOFRAN) injection 4  mg  4 mg  . [COMPLETED] sodium chloride 0.9 % bolus 500 mL  500 mL  . traMADol (ULTRAM) tablet 50 mg  50 mg  . [DISCONTINUED] aspirin suppository 300 mg  300 mg    Current Outpatient Prescriptions  Medication Sig Dispense Refill  . ibuprofen (ADVIL,MOTRIN) 200 MG  tablet Take 400 mg by mouth every 6 (six) hours as needed. For pain      . lisinopril (PRINIVIL,ZESTRIL) 5 MG tablet Take 5 mg by mouth daily.        Allergies: No Known Allergies   Past Medical History: Diagnosis Date  . Hypertension   . Tobacco abuse   . Mood disorder     NOS. Admitted to [facility] for detox in 12/2005  . History of cocaine abuse     Past Surgical History: Procedure Date  . Mouth surgery 2011    Family History: Problem Relation Age of Onset  . Hypertension Mother   . Osteoarthritis Mother   . Hyperlipidemia Mother   . Prostate cancer Father   . CAD Maternal Grandmother   . Lung cancer Paternal Grandfather     tobacco abuse    Social History: History   Social History  . Marital Status: Divorced    Spouse Name: N/A    Number of Children: 1  . Years of Education: 1y college   Occupational History  . [employer]   Social History Main Topics  . Smoking status: Current Every Day Smoker -- 1.5 packs/day for 35 years    Types: Cigarettes  . Smokeless tobacco: Not on file  . Alcohol Use: Yes     Comment: 1-2 beers a week / previously drank heavily until 2009 6 pack or more of beer daily.  . Drug Use: No     Comment: stopped in [facility], smoked cocaine, some heroin (quit in 2011)  . Sexually Active: Not on file   Other Topics Concern  . Not on file   Social History Narrative   Lives in [place], [place] alone.     Vital Signs: Blood pressure 175/102, pulse 59, temperature 98.4 F (36.9 C), temperature source Oral, resp. rate 20, SpO2 99.00%.  Physical Exam:  General Exam:   Head: Normocephalic, atraumatic.  Eyes: No signs of anemia or jaundince.  Nose: Mucous membranes moist, not inflammed, nonerythematous.  Throat: Oropharynx nonerythematous, no exudate appreciated.   Neck: No deformities, masses, or tenderness noted.Supple, No carotid Bruits, no JVD.  Lungs:  Normal respiratory effort. Clear to auscultation BL without  crackles or wheezes.  Heart: RRR. S1 and S2 normal without gallop, murmur, or rubs.  Abdomen:  BS normoactive. Soft, Nondistended, non-tender.  No masses or organomegaly.  Extremities: No pretibial edema.  Skin: No visible rashes, scars.     Neurologic Exam:   Mental Status: Alert, oriented, thought content appropriate.  Speech fluent without evidence of aphasia. Able to follow 3 step commands without difficulty.  Cranial Nerves:   II: Discs flat bilaterally. Visual fields grossly intact.  III/IV/VI: Extraocular movements intact.  Pupils reactive bilaterally.  V/VII: Smile symmetric. facial light touch sensation normal bilaterally.  VIII: Grossly intact.  IX/X: Normal gag.  XI: Bilateral shoulder shrug normal.  XII: Midline tongue extension normal.  Motor:  5/5 bilaterally with normal tone and bulk  Sensory:  Light touch intact throughout, bilaterally  DTRs: 2+ and symmetric throughout  Cerebellar: Normal finger-to-nose, normal rapid alternating movements.     Lab results:  CURRENT LABS: CBC:  Component Value Date/Time   WBC 12.2* 08/05/2012 0831   HGB 15.1 08/05/2012 0831   HCT 42.8 08/05/2012 0831   PLT 244 08/05/2012 0831   MCV 89.7 08/05/2012 0831   NEUTROABS 9.0* 08/05/2012 0831   LYMPHSABS 2.5 08/05/2012 0831   MONOABS 0.7 08/05/2012 0831   EOSABS 0.0 08/05/2012 0831   BASOSABS 0.0 08/05/2012 0831     Metabolic Panel:    Component Value Date/Time   NA 136 08/05/2012 0831   K 3.5 08/05/2012 0831   CL 98 08/05/2012 0831   CO2 25 08/05/2012 0831   BUN 10 08/05/2012 0831   CREATININE 1.04 08/05/2012 0831   GLUCOSE 122* 08/05/2012 0831   CALCIUM 9.6 08/05/2012 0831   AST 24 08/05/2012 0831   ALT 15 08/05/2012 0831   ALKPHOS 60 08/05/2012 0831   BILITOT 0.6 08/05/2012 0831   PROT 7.8 08/05/2012 0831   ALBUMIN 4.0 08/05/2012 0831     Urinalysis:  Basename 08/05/12 1053  COLORURINE YELLOW  LABSPEC 1.015  PHURINE 7.5  GLUCOSEU NEGATIVE  HGBUR  NEGATIVE  BILIRUBINUR NEGATIVE  KETONESUR NEGATIVE  PROTEINUR NEGATIVE  UROBILINOGEN 0.2  NITRITE NEGATIVE  LEUKOCYTESUR NEGATIVE     Drugs of Abuse     Component Value Date/Time   LABOPIA NONE DETECTED 03/28/2012 0337   COCAINSCRNUR POSITIVE* 03/28/2012 0337   LABBENZ NONE DETECTED 03/28/2012 0337   AMPHETMU NONE DETECTED 03/28/2012 0337   THCU POSITIVE* 03/28/2012 0337   LABBARB NONE DETECTED 03/28/2012 0337     Troponin (Point of Care Test) No results found for this basename: TROPIPOC in the last 72 hours   HISTORICAL LABS: No results found for this basename: HGBA1C    No results found for this basename: CHOL,  HDL,  LDLCALC,  LDLDIRECT,  TRIG,  CHOLHDL    Lab Results  Component Value Date   TSH 1.450 04/07/2010     Imaging results:   Dg Chest 2 View (08/05/2012) - No evidence of acute cardiopulmonary disease.   Original Report Authenticated By: Charline Bills, M.D.    Ct Head Wo Contrast (08/05/2012) - IMPRESSION: Suspected left cerebellar infarct, age indeterminate but possibly subacute, new from 03/28/2012.  These results were called by telephone on 08/05/2012 at 0905 hrs to Dr. Clarene Duke, who verbally acknowledged these results.   Original Report Authenticated By: Charline Bills, M.D.      Other results:  EKG (08/05/2012) - Normal Sinus Rhythm approximate rate of 50 bpm, normal axis, ST segments: normal.     Assessment & Plan:  Pt is a 51 y.o. yo male with a PMHx of hypertension (untreated), ongoing tobacco abuse, and remote polysubstance abuse (last cocaine (+) in 03/2012) who was admitted on 08/05/2012 with symptoms of worsening dysequilibrium with initial head CT concerning for possible subacute left cerebellar infarct.  Interventions at this time will be focused on completing stroke workup, risk factor modification, and PT/ OT/ Speech evaluation.    1) Suspected left cerebellar stroke - likely subacute secondary to posterior circulation damage - unclear at  this point if ischemic (2/2 to his risk factors of untreated HTN and ongoing tobacco abuse), versus embolism (no clear source yet identified) versus dissection (given severe headache). This stroke likely accounts for the patient's presenting symptoms of dysequilibrium and vertigo. Further evaluation of the posterior circulation is warranted, as well as identification for potential sources of embolism. Of note, the patient was not on daily aspirin prior to admission.  Plan: - Admit to telemetry unit under stroke protocol. -  Consult stroke team for continued care - spoke with Dr. Thad Ranger. - MRA head/ neck added to MRI ordered in ED - will ask primary team to follow-up results as soon as available as may change management. - Evaluate for embolic source with carotid dopplers, 2-D echocardiogram. - Swallow screen performed per ER nurse report - therefore, will initiate heart healthy diet after passes. - Allow for permissive hypertension - will plan only to decrease blood pressure if > 220 / 120 mmHg. - Will provide risk factor stratification with FLP, A1c, TSH. - Will add statin, aspirin. - PT/OT/speech consults for continued needs. - Smoking cessation counseling provided.  2) Headache - likely related to #1 and uncontrolled HTN. No indication of acute bleed per CT head. - Tylenol and tramadol PRN. - Will await MRI/ MRA head/brain.  3) Chest pain - unclear cause. Patient's history of having worsening pain after activity is concerning for cardiac cause of pain, TIMI score 0.. Cocaine abuse could further contribute towards pain (although pt states remission at this time).  CXR otherwise negative for mediastinal widening, PTX, acute fractures. - Will cycle cardiac enzymes. - Encouraged smoking cessation. - May benefit from outpatient stress test (given risk factors of age, tobacco abuse, cocaine abuse, uncontrolled HTN).  4) Uncontrolled hypertension - blood pressures elevated on admission, however,  will allow for permissive hypertension initially in setting of #1. However, ultimately, will require improved blood pressure control, smoking cessation, and ongoing monitoring. - Permissive hypertension - treat only if BP > 220/120 mmHg.  5) Tobacco abuse - patient has ongoing 1.5 ppd tobacco abuse. He was counseled on the adverse health effects of smoking, benefits of smoking cessation, and was urged to pursue smoking cessation. - Smoking cessation counseling provided. - Will provide cessation counseling information in discharge instruction.  6) History of polysubstance abuse (including alcohol and crack-cocaine) - patient indicates cessation for several years, however, last UDS in 03/2012 was (+) for cocaine, perhaps less open to sharing experience with his mom in the room. - Will obtain UDS. - CIWA observation (add on PRN Ativan if scores persistently elevated). - Will need to rediscuss the history once mother is no longer in the room.     DVT PPX - low molecular weight heparin  CODE STATUS - FULL - this was discussed with the patient.  CONSULTS PLACED - Neurology (Dr. Thad Ranger).  DISPO - Disposition is deferred at this time, awaiting improvement of dysequilibrium status as well as continued workup of his possible stroke.  Anticipated discharge in approximately 2-3 day(s).   The patient does not have a current [facility]PC PCP (No PCP) and does need an [facility] hospital follow-up appointment after discharge.    Is the [facility] hospital follow-up appointment a one-time only appointment? No  Does the patient have transportation limitations that hinder transportation to clinic appointments? Unknown   SERVICE NEEDED AT DISCHARGE - TO BE DETERMINED DURING HOSPITAL COURSE         Y = Yes, Blank = No PT:   OT:   RN:   Equipment:   Other:      Signed: Priscella Mann, DO  PGY-3, Internal Medicine Resident 08/05/2012, 12:05 PM

## 2012-08-05 NOTE — Progress Notes (Signed)
UR completed 

## 2012-08-05 NOTE — ED Notes (Addendum)
Reminded Patient we still need urine sample.  Patient stated okay.  Patient has a urinal at bedside.

## 2012-08-05 NOTE — ED Notes (Signed)
Admitting MD at the bedside. Pt to go to MRI afterwards.

## 2012-08-05 NOTE — Progress Notes (Signed)
*  PRELIMINARY RESULTS* Vascular Ultrasound Carotid Duplex (Doppler) has been completed.  There is evidence of elevated right internal carotid artery velocities in the 40-59% range, however there is no significant plaque morphology to support stenosis; elevated velocities are likely due to tortuosity. Vertebral arteries are patent with antegrade flow.  08/05/2012 3:34 PM Gertie Fey, RDMS, RDCS

## 2012-08-05 NOTE — ED Provider Notes (Signed)
This is a 51 year old male who presents to the emergency department with chief complaint of dizziness. He has been transferred to the CDU to await admission. I have received signout on patient from Dr. Clarene Duke.  Upon evaluation the patient, he states that he is still having dizziness and headache, with associated nausea. He denies chest pain, shortness of breath, vomiting, diarrhea, constipation, peripheral edema, numbness tingling of the extremities.  PE: Gen: A&O x4 HEENT: PERRL, EOM CHEST: RRR, no m/r/g LUNGS: CTAB, no w/r/r ABD: BS x 4, ND/NT EXT: No edema, strong peripheral pulses NEURO: Sensation and strength intact bilaterally  Plan: Admit the patient, and manage pain.  Patient given Tylenol and Zofran.  12:56 PM Patient admitted and transferred off the floor.   Roxy Horseman, PA-C 08/05/12 1256

## 2012-08-06 DIAGNOSIS — I6350 Cerebral infarction due to unspecified occlusion or stenosis of unspecified cerebral artery: Secondary | ICD-10-CM

## 2012-08-06 DIAGNOSIS — F1411 Cocaine abuse, in remission: Secondary | ICD-10-CM

## 2012-08-06 LAB — TROPONIN I: Troponin I: <0.3 ng/mL (ref <0.30)

## 2012-08-06 LAB — RAPID URINE DRUG SCREEN, HOSP PERFORMED
Benzodiazepines: NOT DETECTED
Cocaine: NOT DETECTED

## 2012-08-06 LAB — MRSA PCR SCREENING: MRSA by PCR: NEGATIVE

## 2012-08-06 LAB — URINE CULTURE: Culture: NO GROWTH

## 2012-08-06 LAB — HEMOGLOBIN A1C
Hgb A1c MFr Bld: 5.5 % (ref <5.7)
Mean Plasma Glucose: 111 mg/dL (ref <117)

## 2012-08-06 NOTE — Progress Notes (Signed)
Subjective: He states that his swallowing is much improved with no difficulties reported. His occipital headache is improving.  He denies chest pain or shortness of breath.   Objective: Vital signs in last 24 hours: Filed Vitals:   08/05/12 1900 08/05/12 2039 08/05/12 2236 08/06/12 0100  BP: 181/99 174/95 164/74 157/69  Pulse: 72 77 74 77  Temp: 98.8 F (37.1 C) 98.7 F (37.1 C) 98.2 F (36.8 C) 98.1 F (36.7 C)  TempSrc: Oral Oral Oral Oral  Resp: 17 18 16 14   SpO2: 100% 100% 100% 100%   Weight change:   Intake/Output Summary (Last 24 hours) at 08/06/12 1807 Last data filed at 08/06/12 1245  Gross per 24 hour  Intake   1520 ml  Output      0 ml  Net   1520 ml   Vitals reviewed. General: resting in bed, in NAD HEENT: no scleral icterus Cardiac: RRR, no rubs, murmurs or gallops Pulm: clear to auscultation bilaterally, no wheezes, rales, or rhonchi Abd: soft, nontender, nondistended, BS present Ext: warm and well perfused, no pedal edema Neuro: alert and oriented X3, cranial nerves II-XII grossly intact, strength and sensation to light touch equal in bilateral upper and lower extremities  Lab Results: Basic Metabolic Panel:  Lab 08/05/12 1610 08/05/12 0831  NA -- 136  K -- 3.5  CL -- 98  CO2 -- 25  GLUCOSE -- 122*  BUN -- 10  CREATININE 0.95 1.04  CALCIUM -- 9.6  MG -- --  PHOS -- --   Liver Function Tests:  Lab 08/05/12 0831  AST 24  ALT 15  ALKPHOS 60  BILITOT 0.6  PROT 7.8  ALBUMIN 4.0    Lab 08/05/12 0831  LIPASE 30  AMYLASE --   CBC:  Lab 08/05/12 1540 08/05/12 0831  WBC 13.1* 12.2*  NEUTROABS -- 9.0*  HGB 14.7 15.1  HCT 42.3 42.8  MCV 92.0 89.7  PLT 253 244   Cardiac Enzymes:  Lab 08/06/12 0120 08/05/12 2048 08/05/12 1442  CKTOTAL -- -- --  CKMB -- -- --  CKMBINDEX -- -- --  TROPONINI <0.30 <0.30 <0.30   Hemoglobin A1C:  Lab 08/05/12 1124  HGBA1C 5.5   Fasting Lipid Panel:  Lab 08/05/12 1124  CHOL 164  HDL 38*  LDLCALC  101*  TRIG 125  CHOLHDL 4.3  LDLDIRECT --   Thyroid Function Tests:  Lab 08/05/12 1124  TSH 0.654  T4TOTAL --  FREET4 --  T3FREE --  THYROIDAB --    Urine Drug Screen: Drugs of Abuse     Component Value Date/Time   LABOPIA NONE DETECTED 08/06/2012 1341   COCAINSCRNUR NONE DETECTED 08/06/2012 1341   LABBENZ NONE DETECTED 08/06/2012 1341   AMPHETMU NONE DETECTED 08/06/2012 1341   THCU NONE DETECTED 08/06/2012 1341   LABBARB NONE DETECTED 08/06/2012 1341    Urinalysis:  Lab 08/05/12 1053  COLORURINE YELLOW  LABSPEC 1.015  PHURINE 7.5  GLUCOSEU NEGATIVE  HGBUR NEGATIVE  BILIRUBINUR NEGATIVE  KETONESUR NEGATIVE  PROTEINUR NEGATIVE  UROBILINOGEN 0.2  NITRITE NEGATIVE  LEUKOCYTESUR NEGATIVE    Micro Results: Recent Results (from the past 240 hour(s))  URINE CULTURE     Status: Normal   Collection Time   08/05/12 10:53 AM      Component Value Range Status Comment   Specimen Description URINE, RANDOM   Final    Special Requests NONE   Final    Culture  Setup Time 08/05/2012 11:26   Final  Colony Count NO GROWTH   Final    Culture NO GROWTH   Final    Report Status 08/06/2012 FINAL   Final   MRSA PCR SCREENING     Status: Normal   Collection Time   08/06/12  1:17 PM      Component Value Range Status Comment   MRSA by PCR NEGATIVE  NEGATIVE Final    Studies/Results: Dg Chest 2 View  08/05/2012  *RADIOLOGY REPORT*  Clinical Data: Nausea/vomiting, dizziness  CHEST - 2 VIEW  Comparison: 05/08/2005  Findings: Lungs are essentially clear.  No focal consolidation. No pleural effusion or pneumothorax.  Cardiomediastinal silhouette is within normal limits.  Mild degenerative changes of the visualized thoracolumbar spine.  IMPRESSION: No evidence of acute cardiopulmonary disease.   Original Report Authenticated By: Charline Bills, M.D.    Ct Head Wo Contrast  08/05/2012  *RADIOLOGY REPORT*  Clinical Data: Bilateral temporal headache, dizziness, nausea/vomiting  CT  HEAD WITHOUT CONTRAST  Technique:  Contiguous axial images were obtained from the base of the skull through the vertex without contrast.  Comparison: 03/28/2012  Findings: New hypodensity in the left cerebellum (series 2/image 5), suspicious for cerebellar infarct, age indeterminate but favored to be subacute or chronic (although new from 03/28/2012).  No evidence of parenchymal hemorrhage or extra-axial fluid collection.  No mass effect or midline shift.  Basal cisterns remain patent.  Subcortical white matter and periventricular small vessel ischemic changes.  The visualized paranasal sinuses are essentially clear. The mastoid air cells are unopacified.  No evidence of calvarial fracture.  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  These results were called by telephone on 08/05/2012 at 0905 hrs to Dr. Clarene Duke, who verbally acknowledged these results.   Original Report Authenticated By: Charline Bills, M.D.    Mr [facility] W Contrast  08/05/2012  *RADIOLOGY REPORT*  Clinical Data:  Ataxia with nausea and vomiting.  Stroke.  MRI HEAD WITHOUT CONTRAST MRA HEAD WITHOUT CONTRAST  Technique:  Multiplanar, multiecho pulse sequences of the brain and surrounding structures were obtained without intravenous contrast. Angiographic images of the head were obtained using MRA technique without contrast.  Comparison:  CT 08/05/2012  MRI HEAD  Findings:  Acute infarct in the left mid and inferior cerebellum compatible with acute left PICA infarct.  Brainstem is not involved to a significant degree.  No other acute infarct.  Numerous hyperintensities in the subcortical and deep white matter bilaterally, most consistent with chronic small vessel ischemia. Hyperintensity in the left pons, compatible with chronic ischemia. This does not show restricted diffusion.  Negative for hemorrhage or mass lesion.  No midline shift. Negative for hydrocephalus.  IMPRESSION:  Acute infarct left PICA territory.  Mild to moderate chronic microvascular ischemic changes, advanced for age.  MRA HEAD  Findings: Both vertebral arteries are patent to the basilar.  The right PICA is patent.  Left PICA not visualized.  The basilar is patent.  Superior cerebellar and posterior cerebral arteries are patent bilaterally without stenosis.  Internal carotid artery is widely patent bilaterally.  Anterior and middle cerebral arteries are patent bilaterally.  Negative for cerebral aneurysm.  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  Otherwise negative.   Original Report Authenticated By: Janeece Riggers, M.D.    Mr Brain Wo Contrast  08/05/2012  *RADIOLOGY REPORT*  Clinical Data:  Ataxia with nausea and vomiting.  Stroke.  MRI HEAD WITHOUT CONTRAST MRA HEAD WITHOUT CONTRAST  Technique:  Multiplanar,  multiecho pulse sequences of the brain and surrounding structures were obtained without intravenous contrast. Angiographic images of the head were obtained using MRA technique without contrast.  Comparison:  CT 08/05/2012  MRI HEAD  Findings:  Acute infarct in the left mid and inferior cerebellum compatible with acute left PICA infarct.  Brainstem is not involved to a significant degree.  No other acute infarct.  Numerous hyperintensities in the subcortical and deep white matter bilaterally, most consistent with chronic small vessel ischemia. Hyperintensity in the left pons, compatible with chronic ischemia. This does not show restricted diffusion.  Negative for hemorrhage or mass lesion.  No midline shift. Negative for hydrocephalus.  IMPRESSION: Acute infarct left PICA territory.  Mild to moderate chronic microvascular ischemic changes, advanced for age.  MRA HEAD  Findings: Both vertebral arteries are patent to the basilar.  The right PICA is patent.  Left PICA not visualized.  The basilar is patent.  Superior cerebellar and posterior cerebral arteries are patent bilaterally without  stenosis.  Internal carotid artery is widely patent bilaterally.  Anterior and middle cerebral arteries are patent bilaterally.  Negative for cerebral aneurysm.  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  Otherwise negative.   Original Report Authenticated By: Janeece Riggers, M.D.    2D ECHO: Study Conclusions  - Left ventricle: The cavity size was normal. There was mild concentric hypertrophy. Systolic function was normal. The estimated ejection fraction was in the range of 50% to 55%. Wall motion was normal; there were no regional wall motion abnormalities. There are prominent apical false tendonae (normal variants). Doppler parameters are consistent with abnormal left ventricular relaxation (grade 1 diastolic dysfunction). The E/e' ratio is >10, suggesting elevated LV filling pressure. - Mitral valve: Mildly thickened leaflets . Trivial regurgitation. - Left atrium: The atrium was normal in size. - Atrial septum: No defect or patent foramen ovale was identified. - Inferior vena cava: The vessel was normal in size; the respirophasic diameter changes were in the normal range (= 50%); findings are consistent with normal central venous pressure. - Impressions: No cardiac source of emboli was indentified. Impressions:  - No cardiac source of emboli was indentified.    Medications: I have reviewed the patient's current medications. Scheduled Meds:   . aspirin  325 mg Oral Daily  . atorvastatin  40 mg Oral q1800  . enoxaparin  40 mg Subcutaneous Q24H   Continuous Infusions:   . sodium chloride    . sodium chloride 100 mL/hr at 08/05/12 1315   PRN Meds:.acetaminophen, ondansetron, ondansetron, senna-docusate, traMADol Assessment/Plan: Mr. Cordial is a 51 year old male with a PMHx of hypertension (untreated), significant smoking history, and previous cocaine use who was admitted on 08/05/2012 with symptoms of headache and worsening dysequilibrium. Head imaging  demonstrates acute infarct of the left PICA.   1) Left cerebellar stroke.  It is unclear at this point if ischemic (2/2 to his risk factors of untreated HTN and ongoing tobacco abuse) or due to embolism. This stroke likely accounts for the patient's presenting symptoms of dysequilibrium, nausea and vertigo. Further evaluation for potential sources of embolism is warranted. MRI and MRA of head shows acute infarct of left PICA and was negative for aneurysm or dissection. Patient passed swallow screen. Patient was not on aspirin before admission. UDS was negative. TSH was wnl (0.654), HbA1c was 5.5%, lipid profile demonstrated HDL of 38 and LDL of 101. Smoking cessation counseling was provided. Blood pressures elevated on admission. However, will allow for permissive  hypertension initially in setting of stroke but he will need outpatient follow up for improved HTN control.   -Continue telemetry - Neurology consulted, appreciate recommendations.   - 2D echo done today with EF of 50-55%, no PFO, no signs on embolism.  - TEE in AM  - Continue aspirin, statin and heart healthy diet  - Allow for permissive hypertension - will plan only to decrease blood pressure if > 220 / 120 mmHg.  - Continue Tylenol and tramadol PRN for headache   2) Chest pain - unclear cause. Patient's history of having worsening pain after activity is concerning for cardiac cause of pain, TIMI score 0. Cocaine abuse could further contribute towards pain (although pt states remission at this time). CXR otherwise negative for mediastinal widening, PTX, acute fractures. Troponin has been consistently below 0.30. Patient was encouraged to quit smoking. No new EKG findings.  - Consider outpatient stress test.   3) Tobacco abuse - patient has ongoing 1.5 ppd tobacco abuse. He was counseled on the adverse health effects of smoking, benefits of smoking cessation, and was urged to pursue smoking cessation.  - Will provide cessation counseling  information in discharge instruction.   4) History of polysubstance abuse (including alcohol and crack-cocaine) - patient indicates cessation for several years, however, last UDS in 03/2012 was (+) for cocaine, perhaps less open to sharing experience with his mom in the room. UDS on 11/26 negative for cocaine. No signs of EtOH withdrawal today.  - Placed on CIWA observation (will not add full protocol unless withdrawing).     Dispo: Disposition is deferred at this time, awaiting improvement of current medical problems.  Anticipated discharge in approximately tomorrow. Per PT, he will need 24 hour supervision upon his discharge and he reports that he will move in with his mother who is able to take care of him if this is needed.   The patient does not have a current PCP, therefore will not be requiring OPC follow-up after discharge.   The patient does not have transportation limitations that hinder transportation to clinic appointments.  .Services Needed at time of discharge: Y = Yes, Blank = No PT:   OT:   RN:   Equipment:   Other:     LOS: 1 day   Sara Chu D 08/06/2012, 6:07 PM

## 2012-08-06 NOTE — Progress Notes (Signed)
*  PRELIMINARY RESULTS* Echocardiogram 2D Echocardiogram has been performed.  Jeryl Columbia 08/06/2012, 4:27 PM

## 2012-08-06 NOTE — Evaluation (Signed)
Occupational Therapy Evaluation Patient Details Name: HYKEEM OJEDA MRN: [id number] DOB: 05/04/1961 Today's Date: 08/06/2012 Time: 4098-1191 OT Time Calculation (min): 18 min  OT Assessment / Plan / Recommendation Clinical Impression  This 51 yo male admitted with 1.5 day h/o gait instability and dizziness and found to have acute infarct left PICA territory presents to acute OT with the problems below. Will benefit from acute OT without need for follow up.    OT Assessment  Patient needs continued OT Services    Follow Up Recommendations  No OT follow up    Barriers to Discharge Decreased caregiver support    Equipment Recommendations  None recommended by OT (may need a cane if does not progress)       Frequency  Min 3X/week    Precautions / Restrictions Precautions Precautions: Fall Restrictions Weight Bearing Restrictions: No   Pertinent Vitals/Pain Posterior headache    ADL  Eating/Feeding: Simulated;Independent Where Assessed - Eating/Feeding: Edge of bed Grooming: Simulated;Set up Where Assessed - Grooming: Unsupported sitting Upper Body Bathing: Simulated;Set up Where Assessed - Upper Body Bathing: Unsupported sitting Lower Body Bathing: Simulated;Set up (with min guard standing) Where Assessed - Lower Body Bathing: Unsupported sit to stand Upper Body Dressing: Simulated;Set up Where Assessed - Upper Body Dressing: Unsupported sitting Lower Body Dressing: Simulated;Set up (with min guard standing) Where Assessed - Lower Body Dressing: Unsupported sit to stand Toilet Transfer: Simulated;Min guard Toilet Transfer Method: Sit to Barista:  (bed around 1/4 of unit back to bed) Toileting - Clothing Manipulation and Hygiene: Simulated;Min guard (if standing) Where Assessed - Toileting Clothing Manipulation and Hygiene: Standing Equipment Used:  (None) Transfers/Ambulation Related to ADLs: Min guard for all    OT Diagnosis: Generalized  weakness  OT Problem List: Decreased activity tolerance;Impaired balance (sitting and/or standing);Pain OT Treatment Interventions: Self-care/ADL training;Balance training;Patient/family education;Therapeutic activities   OT Goals Acute Rehab OT Goals OT Goal Formulation: With patient Time For Goal Achievement: 08/13/12 Potential to Achieve Goals: Good ADL Goals Pt Will Perform Grooming: Independently;Unsupported;Standing at sink (2 tasks) ADL Goal: Grooming - Progress: Goal set today Pt Will Transfer to Toilet: Independently;Ambulation;Regular height toilet ADL Goal: Toilet Transfer - Progress: Goal set today Pt Will Perform Toileting - Clothing Manipulation: Independently;Standing ADL Goal: Toileting - Clothing Manipulation - Progress: Goal set today Pt Will Perform Toileting - Hygiene: Independently;Sit to stand from 3-in-1/toilet ADL Goal: Toileting - Hygiene - Progress: Goal set today Pt Will Perform Tub/Shower Transfer: with modified independence;Ambulation ADL Goal: Tub/Shower Transfer - Progress: Goal set today Miscellaneous OT Goals Miscellaneous OT Goal #1: Pt will be able to go around his room at an independent level to gather items in his closets and drawers to work on his balance. OT Goal: Miscellaneous Goal #1 - Progress: Goal set today  Visit Information  Last OT Received On: 08/06/12 Assistance Needed: +1 PT/OT Co-Evaluation/Treatment: Yes    Subjective Data  Subjective: I walk to work Patient Stated Goal: To find out from the doctor when I can go back to work   Prior Functioning     Home Living Lives With: Alone Available Help at Discharge: Family;Available PRN/intermittently Type of Home: House Home Access: Stairs to enter [employer] of Steps: 4 Entrance Stairs-Rails: Right Home Layout: One level Bathroom Shower/Tub: Engineer, manufacturing systems: Standard Home Adaptive Equipment: None Prior Function Level of Independence:  Independent Able to Take Stairs?: Yes Driving: Yes (at work) Vocation: Full time employment Communication Communication: No difficulties Dominant Hand: Right  Vision/Perception Vision - Assessment Vision Assessment: Vision tested Ocular Range of Motion: Within Functional Limits Tracking/Visual Pursuits: Able to track stimulus in all quads without difficulty Convergence: Within functional limits   Cognition  Overall Cognitive Status: Appears within functional limits for tasks assessed/performed Arousal/Alertness: Awake/alert Orientation Level: Appears intact for tasks assessed Behavior During Session: [facility] for tasks performed    Extremity/Trunk Assessment Right Upper Extremity Assessment RUE ROM/Strength/Tone: Within functional levels Left Upper Extremity Assessment LUE ROM/Strength/Tone: Within functional levels Right Lower Extremity Assessment RLE ROM/Strength/Tone: Within functional levels Left Lower Extremity Assessment LLE ROM/Strength/Tone: Within functional levels Trunk Assessment Trunk Assessment: Normal     Mobility Bed Mobility Bed Mobility: Supine to Sit;Sit to Supine Supine to Sit: 5: Supervision;HOB elevated Sit to Supine: 5: Supervision;HOB elevated Transfers Sit to Stand: 5: Supervision Stand to Sit: 5: Supervision              End of Session OT - End of Session Equipment Utilized During Treatment:  (None) Activity Tolerance: Patient limited by pain Patient left: in bed;with bed alarm set       Evette Georges [phone] 08/06/2012, 10:11 AM

## 2012-08-06 NOTE — Evaluation (Signed)
Physical Therapy Evaluation Patient Details Name: [name] MRN: [id number] DOB: 06/23/1961 Today's Date: 08/06/2012 Time: 6962-9528 PT Time Calculation (min): 20 min  PT Assessment / Plan / Recommendation Clinical Impression  Pt admitted with c/o headache and dizziness causing him to fall out of his chair at home. Patient presenting with impaired balance but is functioning at supervision/min guard level. patiet to benefit from PT while in hospital to address gait and balance impairments.    PT Assessment  Patient needs continued PT services    Follow Up Recommendations  Outpatient PT;Supervision/Assistance - 24 hour    Does the patient have the potential to tolerate intense rehabilitation      Barriers to Discharge Decreased caregiver support      Equipment Recommendations   (may need cane if patient doesn't progress)    Recommendations for Other Services     Frequency Min 4X/week    Precautions / Restrictions Precautions Precautions: Fall Restrictions Weight Bearing Restrictions: No   Pertinent Vitals/Pain 5/10       Mobility  Bed Mobility Bed Mobility: Supine to Sit;Sit to Supine Supine to Sit: 5: Supervision;HOB elevated Sit to Supine: 5: Supervision;HOB elevated Transfers Transfers: Sit to Stand;Stand to Sit Sit to Stand: 5: Supervision Stand to Sit: 5: Supervision Ambulation/Gait Ambulation/Gait Assistance: 4: Min guard Ambulation Distance (Feet): 200 Feet Assistive device: None Ambulation/Gait Assistance Details: pt staggering, going L to R but able to I'ly regain balance Gait Pattern: Step-through pattern;Decreased stride length Gait velocity: slower than normal General Gait Details: unsteady, patient reports feeling less stable Stairs: No    Shoulder Instructions     Exercises     PT Diagnosis: Difficulty walking  PT Problem List: Decreased balance;Decreased mobility PT Treatment Interventions: Gait training;Stair training;Balance  training;Neuromuscular re-education   PT Goals Acute Rehab PT Goals PT Goal Formulation: With patient Time For Goal Achievement: 08/13/12 Potential to Achieve Goals: Good Pt will go Supine/Side to Sit: Independently;with HOB 0 degrees PT Goal: Supine/Side to Sit - Progress: Goal set today Pt will go Sit to Stand: Independently PT Goal: Sit to Stand - Progress: Goal set today Pt will Ambulate: >150 feet;Independently;with least restrictive assistive device PT Goal: Ambulate - Progress: Goal set today Pt will Go Up / Down Stairs: 3-5 stairs;with modified independence;with rail(s) PT Goal: Up/Down Stairs - Progress: Goal set today  Visit Information  Last PT Received On: 08/06/12 Assistance Needed: +1 PT/OT Co-Evaluation/Treatment: Yes    Subjective Data  Subjective: Pt received supine in bed with c/o headache 5/10. Patient Stated Goal: to get back to work   Prior Functioning  Home Living Lives With: Alone Available Help at Discharge: Family;Available PRN/intermittently Type of Home: House Home Access: Stairs to enter [employer] of Steps: 4 Entrance Stairs-Rails: Right Home Layout: One level Bathroom Shower/Tub: Engineer, manufacturing systems: Standard Home Adaptive Equipment: None Prior Function Level of Independence: Independent Able to Take Stairs?: Yes Driving: Yes (at work) Vocation: Full time employment Communication Communication: No difficulties Dominant Hand: Right    Cognition  Overall Cognitive Status: Appears within functional limits for tasks assessed/performed Arousal/Alertness: Awake/alert Orientation Level: Oriented X4 / Intact Behavior During Session: [facility] for tasks performed    Extremity/Trunk Assessment Right Upper Extremity Assessment RUE ROM/Strength/Tone: Within functional levels Left Upper Extremity Assessment LUE ROM/Strength/Tone: Within functional levels Right Lower Extremity Assessment RLE ROM/Strength/Tone: Within  functional levels Left Lower Extremity Assessment LLE ROM/Strength/Tone: Within functional levels Trunk Assessment Trunk Assessment: Normal   Balance    End of  Session PT - End of Session Activity Tolerance: Patient tolerated treatment well Patient left: in bed;with call bell/phone within reach;with bed alarm set Nurse Communication: Mobility status  GP     Marcene Brawn 08/06/2012, 9:06 AM  Lewis Shock, PT, DPT Pager #: [phone] Office #: [phone]

## 2012-08-06 NOTE — Progress Notes (Signed)
Subjective: Patient states that he feels like he has improved slightly clinically and his swallowing is not bothering him as much. However he says that he continues to have a severe headache in the back of his head that radiates to his temples. He states that he continues to feel nauseated.   Objective: Vital signs in last 24 hours: Temp:  [98.1 F (36.7 C)-98.8 F (37.1 C)] 98.1 F (36.7 C) (11/26 0100) Pulse Rate:  [47-77] 77  (11/26 0100) Resp:  [14-18] 14  (11/26 0100) BP: (157-181)/(69-99) 157/69 mmHg (11/26 0100) SpO2:  [100 %] 100 % (11/26 0100) Weight change:     Intake/Output from previous day: 11/25 0701 - 11/26 0700 In: 1280 [P.O.:480; I.V.:800] Out: -  Intake/Output this shift: Total I/O In: 240 [P.O.:240] Out: -     Lab Results:  [facility] 08/05/12 1540 08/05/12 0831  WBC 13.1* 12.2*  HGB 14.7 15.1  HCT 42.3 42.8  PLT 253 244   BMET  Basename 08/05/12 1540 08/05/12 0831  NA -- 136  K -- 3.5  CL -- 98  CO2 -- 25  GLUCOSE -- 122*  BUN -- 10  CREATININE 0.95 1.04  CALCIUM -- 9.6    Studies/Results: Dg Chest 2 View  08/05/2012  *RADIOLOGY REPORT*  Clinical Data: Nausea/vomiting, dizziness  CHEST - 2 VIEW  Comparison: 05/08/2005  Findings: Lungs are essentially clear.  No focal consolidation. No pleural effusion or pneumothorax.  Cardiomediastinal silhouette is within normal limits.  Mild degenerative changes of the visualized thoracolumbar spine.  IMPRESSION: No evidence of acute cardiopulmonary disease.   Original Report Authenticated By: Charline Bills, M.D.    Ct Head Wo Contrast  08/05/2012  *RADIOLOGY REPORT*  Clinical Data: Bilateral temporal headache, dizziness, nausea/vomiting  CT HEAD WITHOUT CONTRAST  Technique:  Contiguous axial images were obtained from the base of the skull through the vertex without contrast.  Comparison: 03/28/2012  Findings: New hypodensity in the left cerebellum (series 2/image 5), suspicious for cerebellar infarct,  age indeterminate but favored to be subacute or chronic (although new from 03/28/2012).  No evidence of parenchymal hemorrhage or extra-axial fluid collection.  No mass effect or midline shift.  Basal cisterns remain patent.  Subcortical white matter and periventricular small vessel ischemic changes.  The visualized paranasal sinuses are essentially clear. The mastoid air cells are unopacified.  No evidence of calvarial fracture.  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  These results were called by telephone on 08/05/2012 at 0905 hrs to Dr. Clarene Duke, who verbally acknowledged these results.   Original Report Authenticated By: Charline Bills, M.D.    Mr [facility] W Contrast  08/05/2012  *RADIOLOGY REPORT*  Clinical Data:  Ataxia with nausea and vomiting.  Stroke.  MRI HEAD WITHOUT CONTRAST MRA HEAD WITHOUT CONTRAST  Technique:  Multiplanar, multiecho pulse sequences of the brain and surrounding structures were obtained without intravenous contrast. Angiographic images of the head were obtained using MRA technique without contrast.  Comparison:  CT 08/05/2012  MRI HEAD  Findings:  Acute infarct in the left mid and inferior cerebellum compatible with acute left PICA infarct.  Brainstem is not involved to a significant degree.  No other acute infarct.  Numerous hyperintensities in the subcortical and deep white matter bilaterally, most consistent with chronic small vessel ischemia. Hyperintensity in the left pons, compatible with chronic ischemia. This does not show restricted diffusion.  Negative for hemorrhage or mass lesion.  No midline shift. Negative for hydrocephalus.  IMPRESSION: Acute infarct left PICA territory.  Mild to moderate chronic microvascular ischemic changes, advanced for age.  MRA HEAD  Findings: Both vertebral arteries are patent to the basilar.  The right PICA is patent.  Left PICA not visualized.  The basilar is patent.   Superior cerebellar and posterior cerebral arteries are patent bilaterally without stenosis.  Internal carotid artery is widely patent bilaterally.  Anterior and middle cerebral arteries are patent bilaterally.  Negative for cerebral aneurysm.  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  Otherwise negative.   Original Report Authenticated By: Janeece Riggers, M.D.    Mr Brain Wo Contrast  08/05/2012  *RADIOLOGY REPORT*  Clinical Data:  Ataxia with nausea and vomiting.  Stroke.  MRI HEAD WITHOUT CONTRAST MRA HEAD WITHOUT CONTRAST  Technique:  Multiplanar, multiecho pulse sequences of the brain and surrounding structures were obtained without intravenous contrast. Angiographic images of the head were obtained using MRA technique without contrast.  Comparison:  CT 08/05/2012  MRI HEAD  Findings:  Acute infarct in the left mid and inferior cerebellum compatible with acute left PICA infarct.  Brainstem is not involved to a significant degree.  No other acute infarct.  Numerous hyperintensities in the subcortical and deep white matter bilaterally, most consistent with chronic small vessel ischemia. Hyperintensity in the left pons, compatible with chronic ischemia. This does not show restricted diffusion.  Negative for hemorrhage or mass lesion.  No midline shift. Negative for hydrocephalus.  IMPRESSION: Acute infarct left PICA territory.  Mild to moderate chronic microvascular ischemic changes, advanced for age.  MRA HEAD  Findings: Both vertebral arteries are patent to the basilar.  The right PICA is patent.  Left PICA not visualized.  The basilar is patent.  Superior cerebellar and posterior cerebral arteries are patent bilaterally without stenosis.  Internal carotid artery is widely patent bilaterally.  Anterior and middle cerebral arteries are patent bilaterally.  Negative for cerebral aneurysm.  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  Otherwise negative.   Original Report  Authenticated By: Janeece Riggers, M.D.     Medications: I have reviewed the patient's current medications.  Assessment/Plan: [name] is a 51 year old male with a PMHx of hypertension (untreated), significant smoking history, and previous cocaine use who was admitted on 08/05/2012 with symptoms of headache and worsening dysequilibrium. Head imaging demonstrates acute infarct of the left PICA.   1) Left cerebellar stroke It is unclear at this point if ischemic (2/2 to his risk factors of untreated HTN and ongoing tobacco abuse) or due to embolism. This stroke likely accounts for the patient's presenting symptoms of dysequilibrium, nausea and vertigo. Further evaluation for potential sources of embolism is warranted. MRI and MRA of head shows acute infarct of left PICA and was negative for aneurysm or dissection. Patient passed swallow screen. Patient was not on aspirin before admission. UDS was negative. TSH was wnl (0.654), HbA1c was 5.5%, lipid profile demonstrated HDL of 38 and LDL of 101. Smoking cessation counseling was provided. Blood pressures elevated on admission. However, will allow for permissive hypertension initially in setting of stroke. However, ultimately, will require improved blood pressure control and ongoing monitoring.  Plan:  - Admit to telemetry unit under stroke protocol.  - 2D echo done today with results pending and TEE ordered for tomorrow to look for embolic source.  - Continue aspirin, statin and heart healthy diet  - Allow for permissive hypertension - will plan only to decrease blood pressure if > 220 /  120 mmHg.  - Continue Tylenol and tramadol PRN for headache  2) Chest pain - unclear cause. Patient's history of having worsening pain after activity is concerning for cardiac cause of pain, TIMI score 0. Cocaine abuse could further contribute towards pain (although pt states remission at this time). CXR otherwise negative for mediastinal widening, PTX, acute fractures.  Troponin has been consistently below 0.30. Patient was encouraged to quit smoking. No new EKG findings.  - Consider outpatient stress test.   3) Tobacco abuse - patient has ongoing 1.5 ppd tobacco abuse. He was counseled on the adverse health effects of smoking, benefits of smoking cessation, and was urged to pursue smoking cessation.  - Will provide cessation counseling information in discharge instruction.   4) History of polysubstance abuse (including alcohol and crack-cocaine) - patient indicates cessation for several years, however, last UDS in 03/2012 was (+) for cocaine, perhaps less open to sharing experience with his mom in the room. Recent UDS was negative.  - Will need to rediscuss the history once mother is no longer in the room. - Placed on CIWA observation (will not add full protocol unless withdrawing)     LOS: 1 day   Theresa Mulligan 08/06/2012, 2:31 PM

## 2012-08-06 NOTE — H&P (Signed)
INTERNAL MEDICINE TEACHING SERVICE Attending Admission Note  Date: 08/06/2012  Patient name: [name]  Medical record number: [id number]  Date of birth: 07-Nov-1960    I have seen and evaluated [name] and discussed their care with the Residency Team.  51 yr. Old AAM w/ pmhx significant for HTN, tobacco abuse, polysubstance abuse (including marijuana, crack cocaine, heroin), presented due to dizziness and gait instability. He stated he had been having nausea, HA, and problems with his equilibrium over the past 1-2 days. He first noticed this after finishing a glass of wine with dinner. At the time, he felt off balance and fell out of his chair to his right. He had multiple episode of vomiting at night.  In addition, he began to have a significant bilateral temporal headache associated with photophobia without vision changes. He denied any focal weakness in face or limbs. He also reports intermittent chest pain with exertion over the past 3 months, described as sharp pain without radiation, nausea, SOB, or vomiting. In the ED he was treated with meclizine, but CT head of the head found a acute-subacute left cerebellar infarct.  He did not receive tPA as he was outside the window for therapy. MRI of the brain does confirm left cerebellar infarct and MRA brain does not outline the left PICA. His CVA risk factors are Tobacco use, HTN, and substance abuse.  Physical Exam: Blood pressure 157/69, pulse 77, temperature 98.1 F (36.7 C), temperature source Oral, resp. rate 14, SpO2 100.00%.  General: Vital signs reviewed and noted. Well-developed, well-nourished, in no acute distress; alert, appropriate and cooperative throughout examination.  Head: Normocephalic, atraumatic.  Eyes: PERRL, EOMI, No signs of anemia or jaundince.  Nose: Mucous membranes moist, not inflammed, nonerythematous.  Throat: Oropharynx nonerythematous, no exudate appreciated.   Neck: No deformities, masses, or  tenderness noted.Supple, No carotid Bruits, no JVD.  Lungs:  Normal respiratory effort. Clear to auscultation BL without crackles or wheezes.  Heart: RRR. S1 and S2 normal without gallop, murmur, or rubs.  Abdomen:  BS normoactive. Soft, Nondistended, non-tender.  No masses or organomegaly.  Extremities: No pretibial edema.  Neurologic: A&O X3, CN II - XII are grossly intact. Motor strength is 5/5 in the all 4 extremities, Sensations intact to light touch. He has some minimal dysmetria on finger to nose testing on the left and heel to shin on the left.  Skin: No visible rashes, scars.    Lab results: Results for orders placed during the hospital encounter of 08/05/12 (from the past 24 hour(s))  TROPONIN I     Status: Normal   Collection Time   08/05/12  8:48 PM      Component Value Range   Troponin I <0.30  <0.30 ng/mL  TROPONIN I     Status: Normal   Collection Time   08/06/12  1:20 AM      Component Value Range   Troponin I <0.30  <0.30 ng/mL  MRSA PCR SCREENING     Status: Normal   Collection Time   08/06/12  1:17 PM      Component Value Range   MRSA by PCR NEGATIVE  NEGATIVE  URINE RAPID DRUG SCREEN (HOSP PERFORMED)     Status: Normal   Collection Time   08/06/12  1:41 PM      Component Value Range   Opiates NONE DETECTED  NONE DETECTED   Cocaine NONE DETECTED  NONE DETECTED   Benzodiazepines NONE DETECTED  NONE DETECTED  Amphetamines NONE DETECTED  NONE DETECTED   Tetrahydrocannabinol NONE DETECTED  NONE DETECTED   Barbiturates NONE DETECTED  NONE DETECTED    Imaging results:  Dg Chest 2 View  08/05/2012  *RADIOLOGY REPORT*  Clinical Data: Nausea/vomiting, dizziness  CHEST - 2 VIEW  Comparison: 05/08/2005  Findings: Lungs are essentially clear.  No focal consolidation. No pleural effusion or pneumothorax.  Cardiomediastinal silhouette is within normal limits.  Mild degenerative changes of the visualized thoracolumbar spine.  IMPRESSION: No evidence of acute  cardiopulmonary disease.   Original Report Authenticated By: Charline Bills, M.D.    Ct Head Wo Contrast  08/05/2012  *RADIOLOGY REPORT*  Clinical Data: Bilateral temporal headache, dizziness, nausea/vomiting  CT HEAD WITHOUT CONTRAST  Technique:  Contiguous axial images were obtained from the base of the skull through the vertex without contrast.  Comparison: 03/28/2012  Findings: New hypodensity in the left cerebellum (series 2/image 5), suspicious for cerebellar infarct, age indeterminate but favored to be subacute or chronic (although new from 03/28/2012).  No evidence of parenchymal hemorrhage or extra-axial fluid collection.  No mass effect or midline shift.  Basal cisterns remain patent.  Subcortical white matter and periventricular small vessel ischemic changes.  The visualized paranasal sinuses are essentially clear. The mastoid air cells are unopacified.  No evidence of calvarial fracture.  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  These results were called by telephone on 08/05/2012 at 0905 hrs to Dr. Clarene Duke, who verbally acknowledged these results.   Original Report Authenticated By: Charline Bills, M.D.    Mr [facility] W Contrast  08/05/2012  *RADIOLOGY REPORT*  Clinical Data:  Ataxia with nausea and vomiting.  Stroke.  MRI HEAD WITHOUT CONTRAST MRA HEAD WITHOUT CONTRAST  Technique:  Multiplanar, multiecho pulse sequences of the brain and surrounding structures were obtained without intravenous contrast. Angiographic images of the head were obtained using MRA technique without contrast.  Comparison:  CT 08/05/2012  MRI HEAD  Findings:  Acute infarct in the left mid and inferior cerebellum compatible with acute left PICA infarct.  Brainstem is not involved to a significant degree.  No other acute infarct.  Numerous hyperintensities in the subcortical and deep white matter bilaterally, most consistent with chronic small vessel  ischemia. Hyperintensity in the left pons, compatible with chronic ischemia. This does not show restricted diffusion.  Negative for hemorrhage or mass lesion.  No midline shift. Negative for hydrocephalus.  IMPRESSION: Acute infarct left PICA territory.  Mild to moderate chronic microvascular ischemic changes, advanced for age.  MRA HEAD  Findings: Both vertebral arteries are patent to the basilar.  The right PICA is patent.  Left PICA not visualized.  The basilar is patent.  Superior cerebellar and posterior cerebral arteries are patent bilaterally without stenosis.  Internal carotid artery is widely patent bilaterally.  Anterior and middle cerebral arteries are patent bilaterally.  Negative for cerebral aneurysm.  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  Otherwise negative.   Original Report Authenticated By: Janeece Riggers, M.D.    Mr Brain Wo Contrast  08/05/2012  *RADIOLOGY REPORT*  Clinical Data:  Ataxia with nausea and vomiting.  Stroke.  MRI HEAD WITHOUT CONTRAST MRA HEAD WITHOUT CONTRAST  Technique:  Multiplanar, multiecho pulse sequences of the brain and surrounding structures were obtained without intravenous contrast. Angiographic images of the head were obtained using MRA technique without contrast.  Comparison:  CT 08/05/2012  MRI HEAD  Findings:  Acute infarct in the left  mid and inferior cerebellum compatible with acute left PICA infarct.  Brainstem is not involved to a significant degree.  No other acute infarct.  Numerous hyperintensities in the subcortical and deep white matter bilaterally, most consistent with chronic small vessel ischemia. Hyperintensity in the left pons, compatible with chronic ischemia. This does not show restricted diffusion.  Negative for hemorrhage or mass lesion.  No midline shift. Negative for hydrocephalus.  IMPRESSION: Acute infarct left PICA territory.  Mild to moderate chronic microvascular ischemic changes, advanced for age.  MRA HEAD   Findings: Both vertebral arteries are patent to the basilar.  The right PICA is patent.  Left PICA not visualized.  The basilar is patent.  Superior cerebellar and posterior cerebral arteries are patent bilaterally without stenosis.  Internal carotid artery is widely patent bilaterally.  Anterior and middle cerebral arteries are patent bilaterally.  Negative for cerebral aneurysm.  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  Otherwise negative.   Original Report Authenticated By: Janeece Riggers, M.D.      Assessment and Plan: I agree with the formulated Assessment and Plan with the following changes: 51 yr. Old AAM w/ pmhx significant for HTN, tobacco abuse, polysubstance abuse (including marijuana, crack cocaine, heroin), presented due to dizziness and gait instability, with acute/subacute left PICA territory cerebellar infarct. 1) Acute/subacute left PICA territory cerebellar infarct: ASA 325 mg daily. He will need TEE. He will need hypercoagulable workup. PT/OT. He is a fall risk. 2) HTN: Would not treat at this time. Permissive HTN. 3) workup lipids, hypercoag panel. 4) rest per resident note.  Jonah Blue, DO 11/26/20135:05 PM

## 2012-08-06 NOTE — Progress Notes (Signed)
Stroke Team Progress Note  HISTORY  [name] is an 51 y.o. male who was having a glass of wine at dinner 08/03/2012 and had the acute onset of severe nausea, vomiting and dizziness. He became so dizzy that he fell out of his chair to the right side. His symptoms improved somewhat throughout the night but he continued to have nausea and poor balance. Along with these symptoms the patient developed a headache as well on 11/24. Headache was occipital, bitemporal and sharp. Patient rated his headache on 11/24 at a 10/10, now a 5/10. There was some associated photophobia but no phonophobia. He presented to the ED 08/05/2012. Patient was not a TPA candidate secondary to delay in arrival. He was admitted for further evaluation and treatment.  SUBJECTIVE His family is at the bedside.  Overall he feels his condition is stable.   OBJECTIVE Most recent Vital Signs: Filed Vitals:   08/05/12 1900 08/05/12 2039 08/05/12 2236 08/06/12 0100  BP: 181/99 174/95 164/74 157/69  Pulse: 72 77 74 77  Temp: 98.8 F (37.1 C) 98.7 F (37.1 C) 98.2 F (36.8 C) 98.1 F (36.7 C)  TempSrc: Oral Oral Oral Oral  Resp: 17 18 16 14   SpO2: 100% 100% 100% 100%   CBG (last 3)  No results found for this basename: GLUCAP:3 in the last 72 hours  IV Fluid Intake:     . sodium chloride    . sodium chloride 100 mL/hr at 08/05/12 1315  . [DISCONTINUED] sodium chloride 125 mL/hr at 08/05/12 1191    MEDICATIONS    . aspirin  325 mg Oral Daily  . atorvastatin  40 mg Oral q1800  . enoxaparin  40 mg Subcutaneous Q24H  . [COMPLETED] ondansetron (ZOFRAN) IV  4 mg Intravenous Once  . [DISCONTINUED] aspirin  300 mg Rectal Once   PRN:  acetaminophen, ondansetron, ondansetron, senna-docusate, traMADol  Diet:  Cardiac thin liquids Activity:   DVT Prophylaxis:  Lovenox 40 mg sq daily   CLINICALLY SIGNIFICANT STUDIES Basic Metabolic Panel:  Lab 08/05/12 4782 08/05/12 0831  NA -- 136  K -- 3.5  CL -- 98  CO2 -- 25    GLUCOSE -- 122*  BUN -- 10  CREATININE 0.95 1.04  CALCIUM -- 9.6  MG -- --  PHOS -- --   Liver Function Tests:  Lab 08/05/12 0831  AST 24  ALT 15  ALKPHOS 60  BILITOT 0.6  PROT 7.8  ALBUMIN 4.0   CBC:  Lab 08/05/12 1540 08/05/12 0831  WBC 13.1* 12.2*  NEUTROABS -- 9.0*  HGB 14.7 15.1  HCT 42.3 42.8  MCV 92.0 89.7  PLT 253 244   Coagulation: No results found for this basename: LABPROT:4,INR:4 in the last 168 hours Cardiac Enzymes:  Lab 08/06/12 0120 08/05/12 2048 08/05/12 1442  CKTOTAL -- -- --  CKMB -- -- --  CKMBINDEX -- -- --  TROPONINI <0.30 <0.30 <0.30   Urinalysis:  Lab 08/05/12 1053  COLORURINE YELLOW  LABSPEC 1.015  PHURINE 7.5  GLUCOSEU NEGATIVE  HGBUR NEGATIVE  BILIRUBINUR NEGATIVE  KETONESUR NEGATIVE  PROTEINUR NEGATIVE  UROBILINOGEN 0.2  NITRITE NEGATIVE  LEUKOCYTESUR NEGATIVE   Lipid Panel    Component Value Date/Time   CHOL 164 08/05/2012 1124   TRIG 125 08/05/2012 1124   HDL 38* 08/05/2012 1124   CHOLHDL 4.3 08/05/2012 1124   VLDL 25 08/05/2012 1124   LDLCALC 101* 08/05/2012 1124   HgbA1C  Lab Results  Component Value Date   HGBA1C  5.5 08/05/2012    Urine Drug Screen:     Component Value Date/Time   LABOPIA NONE DETECTED 03/28/2012 0337   COCAINSCRNUR POSITIVE* 03/28/2012 0337   LABBENZ NONE DETECTED 03/28/2012 0337   AMPHETMU NONE DETECTED 03/28/2012 0337   THCU POSITIVE* 03/28/2012 0337   LABBARB NONE DETECTED 03/28/2012 0337    Alcohol Level: No results found for this basename: ETH:2 in the last 168 hours  CT of the brain  08/05/2012 Suspected left cerebellar infarct, age indeterminate but possibly subacute, new from 03/28/2012.  MRI of the brain  08/05/2012  Acute infarct left PICA territory.  Mild to moderate chronic microvascular ischemic changes, advanced for age.   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  Otherwise negative.    2D Echocardiogram    Carotid Doppler  There is  evidence of elevated right internal carotid artery velocities in the 40-59% range, however there is no significant plaque morphology to support stenosis; elevated velocities are likely due to tortuosity. Vertebral arteries are patent with antegrade flow.  CXR  08/05/2012   No evidence of acute cardiopulmonary disease.     EKG  normal EKG, normal sinus rhythm, nonspecific ST wave changes.   Therapy Recommendations PT - outpatient; OT - none; ST -   Physical Exam   Young African American gentleman currently not in distress.Awake alert. Afebrile. Head is nontraumatic. Neck is supple without bruit. Hearing is normal. Cardiac exam no murmur or gallop. Lungs are clear to auscultation. Distal pulses are well felt.   Neurological Exam ; Awake  Alert oriented x 3. Normal speech and language.eye movements full with mild left beating nystagmus.fundi were not visualized. Vision acuity and fields appear normal. Face symmetric. Tongue midline. Normal strength, tone, reflexes but mild impaired left finger to nose coordination. Normal sensation. Gait deferred.  ASSESSMENT [name] is a 51 y.o. male presenting with nausea, vomiting and dizziness . 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 Infarct felt to be embolic secondary to unknown etiology. He has a known history of substance abuse, including cocaine and THC, both associated with acute stroke.  Work up underway. On no antiplatelets prior to admission. Now on aspirin 325 mg orally every day for secondary stroke prevention. Patient with resultant nausea, vomiting and dizziness.  Hypertension History substance abuse including cocaine and THC, etoh use Tobacco abuse  Hospital day # 1  TREATMENT/PLAN  Continueaspirin 325 mg orally every day for secondary stroke prevention. TEE to look for embolic source. Please arrange with pts cardiologist or cardiologist of choice. Will need to be NPO after midnight. If positive for  PFO (patent foramen ovale), check bilateral lower extremity venous dopplers to rule out DVT as possible source of stroke.  Stoke workup in the young should include labs to look for hypercoagulable source:  Hypercoagulable panel (except Factor V Leiden & Beta-2-glycoprotein, which are both associated with venous not arterial infarcts), Vasculitic labs (C3, C4, CH50, ESR, ANA) HIV & RPR F/u UDS ordered, not yet collected  Dr. Pearlean Brownie discussed with Dr. Merrilyn Puma, MSN, RN, ANVP-BC, ANP-BC, GNP-BC [facility] Pager: [phone] 08/06/2012 11:09 AM  I have personally examined this patient, reviewed pertinent data, developed plan of care and degree with above. Delia Heady, MD Medical Director [facility] Pager: [phone] 08/06/2012 4:37 PM

## 2012-08-06 NOTE — Evaluation (Signed)
Speech Language Pathology Evaluation Patient Details Name: [name] MRN: [id number] DOB: 1961-03-07 Today's Date: 08/06/2012 Time: 4098-1191 SLP Time Calculation (min): 16 min  Problem List:  Patient Active Problem List  Diagnosis  . Stroke  . Hypertension  . Tobacco abuse  . History of cocaine abuse  . History of alcohol abuse   Past Medical History:  Past Medical History  Diagnosis Date  . Hypertension   . Tobacco abuse   . Mood disorder     NOS. Admitted to [facility] for detox in 12/2005  . History of cocaine abuse   . History of alcohol abuse   . Hypercholesterolemia   . Anginal pain   . Hepatitis B 1991  . Stroke 08/05/2012    denies residual (08/05/2012)   Past Surgical History:  Past Surgical History  Procedure Date  . Mouth surgery 2011    "had to cut tooth out of my gum" (08/05/2012)   HPI:  51 yo male adm to [facility] 08/05/12 with few day h/o mildly slurred speech, leaning to right when trying to walk and N/V.  Pt found to have a cerebellum cva per MRI.  Order received for speech/language/cognitive evaluation.      Assessment / Plan / Recommendation Clinical Impression  Pt presents with intact speech, language and cognitive skills.  He was able to articulate at complex conversation level re: medical events, symptoms of cva.  Pt also recalled acronym slp provided (3 of 4 items without cue) to help recall cva symptoms within approx 3 minutes.  Pt reports speech was "slurry" prior to admission but it has improved to baseline level. No further slp indicated.  Thanks for this referral.     SLP Assessment  Patient does not need any further Speech Lanaguage Pathology Services    Follow Up Recommendations  None    Frequency and Duration             SLP Evaluation Prior Functioning  Type of Home: House Lives With: Alone Available Help at Discharge: Family;Available PRN/intermittently Vocation: Full time employment   Cognition  Overall Cognitive Status:  Appears within functional limits for tasks assessed Arousal/Alertness: Awake/alert Orientation Level: Oriented X4    Comprehension  Auditory Comprehension Overall Auditory Comprehension: Appears within functional limits for tasks assessed Visual Recognition/Discrimination Discrimination: Within Function Limits Reading Comprehension Reading Status: Not tested    Expression Expression Primary Mode of Expression: Verbal Verbal Expression Overall Verbal Expression: Appears within functional limits for tasks assessed Written Expression Dominant Hand: Right   Oral / Motor Oral Motor/Sensory Function Overall Oral Motor/Sensory Function: Appears within functional limits for tasks assessed Motor Speech Overall Motor Speech: Appears within functional limits for tasks assessed   GO     Mills Koller, MS [facility] SLP [phone]

## 2012-08-07 ENCOUNTER — Encounter (HOSPITAL_COMMUNITY)
Admission: EM | Disposition: A | Discharge status: Home Health | Payer: Self-pay | Source: Home / Self Care | Attending: Internal Medicine

## 2012-08-07 ENCOUNTER — Encounter (HOSPITAL_COMMUNITY): Payer: Self-pay

## 2012-08-07 ENCOUNTER — Emergency Department (HOSPITAL_COMMUNITY)
Admission: EM | Admit: 2012-08-07 | Discharge: 2012-08-07 | Disposition: A | Discharge status: Home / Self Care | Payer: Self-pay | Attending: Emergency Medicine | Admitting: Emergency Medicine

## 2012-08-07 DIAGNOSIS — F1011 Alcohol abuse, in remission: Secondary | ICD-10-CM

## 2012-08-07 DIAGNOSIS — Z7982 Long term (current) use of aspirin: Secondary | ICD-10-CM | POA: Insufficient documentation

## 2012-08-07 DIAGNOSIS — Z79899 Other long term (current) drug therapy: Secondary | ICD-10-CM | POA: Insufficient documentation

## 2012-08-07 DIAGNOSIS — Z8673 Personal history of transient ischemic attack (TIA), and cerebral infarction without residual deficits: Secondary | ICD-10-CM | POA: Insufficient documentation

## 2012-08-07 DIAGNOSIS — I6350 Cerebral infarction due to unspecified occlusion or stenosis of unspecified cerebral artery: Secondary | ICD-10-CM

## 2012-08-07 DIAGNOSIS — F1010 Alcohol abuse, uncomplicated: Secondary | ICD-10-CM | POA: Insufficient documentation

## 2012-08-07 DIAGNOSIS — Y9229 Other specified public building as the place of occurrence of the external cause: Secondary | ICD-10-CM | POA: Insufficient documentation

## 2012-08-07 DIAGNOSIS — I639 Cerebral infarction, unspecified: Secondary | ICD-10-CM

## 2012-08-07 DIAGNOSIS — Z8619 Personal history of other infectious and parasitic diseases: Secondary | ICD-10-CM | POA: Insufficient documentation

## 2012-08-07 DIAGNOSIS — E7800 Pure hypercholesterolemia, unspecified: Secondary | ICD-10-CM | POA: Insufficient documentation

## 2012-08-07 DIAGNOSIS — Y9389 Activity, other specified: Secondary | ICD-10-CM | POA: Insufficient documentation

## 2012-08-07 DIAGNOSIS — F1410 Cocaine abuse, uncomplicated: Secondary | ICD-10-CM | POA: Insufficient documentation

## 2012-08-07 DIAGNOSIS — I10 Essential (primary) hypertension: Secondary | ICD-10-CM | POA: Insufficient documentation

## 2012-08-07 DIAGNOSIS — Z720 Tobacco use: Secondary | ICD-10-CM

## 2012-08-07 DIAGNOSIS — F17200 Nicotine dependence, unspecified, uncomplicated: Secondary | ICD-10-CM | POA: Insufficient documentation

## 2012-08-07 DIAGNOSIS — I6789 Other cerebrovascular disease: Secondary | ICD-10-CM

## 2012-08-07 DIAGNOSIS — Z8679 Personal history of other diseases of the circulatory system: Secondary | ICD-10-CM | POA: Insufficient documentation

## 2012-08-07 DIAGNOSIS — Z8659 Personal history of other mental and behavioral disorders: Secondary | ICD-10-CM | POA: Insufficient documentation

## 2012-08-07 DIAGNOSIS — W19XXXA Unspecified fall, initial encounter: Secondary | ICD-10-CM

## 2012-08-07 DIAGNOSIS — W06XXXA Fall from bed, initial encounter: Secondary | ICD-10-CM | POA: Insufficient documentation

## 2012-08-07 HISTORY — PX: TEE WITHOUT CARDIOVERSION: SHX5443

## 2012-08-07 LAB — BASIC METABOLIC PANEL
CO2: 26 mEq/L (ref 19–32)
Calcium: 9.6 mg/dL (ref 8.4–10.5)
Creatinine, Ser: 0.86 mg/dL (ref 0.50–1.35)
Glucose, Bld: 117 mg/dL — ABNORMAL HIGH (ref 70–99)

## 2012-08-07 LAB — CBC
MCH: 30.7 pg (ref 26.0–34.0)
MCV: 90.1 fL (ref 78.0–100.0)
Platelets: 241 10*3/uL (ref 150–400)
RBC: 4.36 MIL/uL (ref 4.22–5.81)

## 2012-08-07 LAB — ANTITHROMBIN III: AntiThromb III Func: 117 % (ref 75–120)

## 2012-08-07 SURGERY — ECHOCARDIOGRAM, TRANSESOPHAGEAL
Anesthesia: Moderate Sedation

## 2012-08-07 MED ORDER — KETOROLAC TROMETHAMINE 30 MG/ML IJ SOLN
30.0000 mg | Freq: Once | INTRAMUSCULAR | Status: AC
  Administered 2012-08-07: 30 mg via INTRAVENOUS
  Filled 2012-08-07: qty 1

## 2012-08-07 MED ORDER — AMLODIPINE BESYLATE 5 MG PO TABS: 5.0000 mg | ORAL_TABLET | Freq: Every day | ORAL | Status: DC

## 2012-08-07 MED ORDER — HYDROCHLOROTHIAZIDE 12.5 MG PO CAPS: 12.5000 mg | ORAL_CAPSULE | Freq: Every day | ORAL | Status: DC

## 2012-08-07 MED ORDER — ONDANSETRON HCL 4 MG PO TABS: 4.0000 mg | ORAL_TABLET | Freq: Three times a day (TID) | ORAL | Status: DC | PRN

## 2012-08-07 MED ORDER — FENTANYL CITRATE 0.05 MG/ML IJ SOLN
INTRAMUSCULAR | Status: DC | PRN
  Administered 2012-08-07: 25 ug via INTRAVENOUS
  Administered 2012-08-07: 50 ug via INTRAVENOUS

## 2012-08-07 MED ORDER — TRAMADOL HCL 50 MG PO TABS: 100.0000 mg | ORAL_TABLET | Freq: Four times a day (QID) | ORAL | Status: DC | PRN

## 2012-08-07 MED ORDER — HYDRALAZINE HCL 20 MG/ML IJ SOLN
INTRAMUSCULAR | Status: AC
  Filled 2012-08-07: qty 1

## 2012-08-07 MED ORDER — BUTAMBEN-TETRACAINE-BENZOCAINE 2-2-14 % EX AERO
INHALATION_SPRAY | CUTANEOUS | Status: DC | PRN
  Administered 2012-08-07: 2 via TOPICAL

## 2012-08-07 MED ORDER — MIDAZOLAM HCL 10 MG/2ML IJ SOLN
INTRAMUSCULAR | Status: DC | PRN
  Administered 2012-08-07 (×3): 2 mg via INTRAVENOUS
  Administered 2012-08-07: 1 mg via INTRAVENOUS

## 2012-08-07 MED ORDER — PRAVASTATIN SODIUM 20 MG PO TABS: 20.0000 mg | ORAL_TABLET | Freq: Every day | ORAL | Status: DC

## 2012-08-07 MED ORDER — KETOROLAC TROMETHAMINE 10 MG PO TABS: 20.0000 mg | ORAL_TABLET | Freq: Once | ORAL | Status: DC

## 2012-08-07 MED ORDER — ASPIRIN 325 MG PO TABS: 325.0000 mg | ORAL_TABLET | Freq: Every day | ORAL | Status: DC

## 2012-08-07 MED ORDER — FENTANYL CITRATE 0.05 MG/ML IJ SOLN
INTRAMUSCULAR | Status: AC
  Filled 2012-08-07: qty 2

## 2012-08-07 MED ORDER — MIDAZOLAM HCL 5 MG/ML IJ SOLN
INTRAMUSCULAR | Status: AC
  Filled 2012-08-07: qty 2

## 2012-08-07 MED ORDER — ACETAMINOPHEN 325 MG PO TABS: 650.0000 mg | ORAL_TABLET | Freq: Four times a day (QID) | ORAL | Status: DC | PRN

## 2012-08-07 MED ORDER — ATORVASTATIN CALCIUM 40 MG PO TABS: 40.0000 mg | ORAL_TABLET | Freq: Every day | ORAL | Status: DC

## 2012-08-07 MED ORDER — TRAMADOL HCL 50 MG PO TABS
100.0000 mg | ORAL_TABLET | Freq: Four times a day (QID) | ORAL | Status: DC | PRN
  Filled 2012-08-07: qty 2

## 2012-08-07 NOTE — Progress Notes (Signed)
Discharge orders received. Went into pts room to discharge pt and give pt education and pt was very angry about having to leave. Told pt he could stay through dinner but pt insisted on leaving right now.  Pt very angry and yelled he was "leaving now and this place is a bureaucracy."  Pt began pulling off his tele monitor and tried pulling out his IV.  After taking IV out pt was helped to get dressed. Pt started walking off the unit so RN gave pt discharge paperwork and follow up instructions.  Pt refused to sign discharge paperwork and RN asked if pt had a ride to go home.  Pt stated he "would walk out of here and call a taxi."  Pt continued to yell and refuse our help.  Pt was escorted to the front of the unit but refused to be taken out in a wheelchair. As pt was leaving he yelled that he was going down to MRI to retrieve a lost earring.  Pt would not allow nursing staff to assist him with this.  Pt left unit ambulatory and no signs of acute distress.

## 2012-08-07 NOTE — ED Notes (Signed)
Pt brought to ED from upstairs after being found laying in hall by elevators. Pt was to be discharged from hospital. Pt states he was angry when he found out he was being 'rushed' out of the hospital so he didn't wait for the wheelchair or listen to his instructions. States he had just returned from TEE and didn't feel he was ready to leave yet. Pt unhappy with being kept NPO and then only given a [place] sandwich. Pt states he now feels dizzy and weak. Skin w/d, resp e/u. Pt appears in nad. Pt arrived and remains in wheelchair. Speech is clear. No neuro deficits noted.

## 2012-08-07 NOTE — Progress Notes (Signed)
Stroke Team Progress Note  HISTORY  [name] is an 51 y.o. male who was having a glass of wine at dinner 08/03/2012 and had the acute onset of severe nausea, vomiting and dizziness. He became so dizzy that he fell out of his chair to the right side. His symptoms improved somewhat throughout the night but he continued to have nausea and poor balance. Along with these symptoms the patient developed a headache as well on 11/24. Headache was occipital, bitemporal and sharp. Patient rated his headache on 11/24 at a 10/10, now a 5/10. There was some associated photophobia but no phonophobia. He presented to the ED 08/05/2012. Patient was not a TPA candidate secondary to delay in arrival. He was admitted for further evaluation and treatment.  SUBJECTIVE "i want to eat. Can i have at least a banana?" patient instructed by Dr. Pearlean Brownie to remain NPO.  OBJECTIVE Most recent Vital Signs: Filed Vitals:   08/06/12 2200 08/07/12 0200 08/07/12 0535 08/07/12 0600  BP: 189/97 184/95  171/102  Pulse: 78 68  57  Temp: 97.6 F (36.4 C) 98 F (36.7 C)  97.4 F (36.3 C)  TempSrc: Oral Oral  Oral  Resp: 18 18  18   Height:   5\' 7"  (1.702 m)   Weight:   80.1 kg (176 lb 9.4 oz)   SpO2: 99% 100%  100%   IV Fluid Intake:      . sodium chloride    . sodium chloride 100 mL/hr at 08/07/12 0658   MEDICATIONS    . aspirin  325 mg Oral Daily  . atorvastatin  40 mg Oral q1800  . [COMPLETED] ketorolac  30 mg Intravenous Once  . [DISCONTINUED] enoxaparin  40 mg Subcutaneous Q24H  . [DISCONTINUED] ketorolac  20 mg Oral Once   PRN:  acetaminophen, ondansetron, ondansetron, senna-docusate, traMADol, [DISCONTINUED] traMADol, [DISCONTINUED] traMADol  Diet:  NPO thin liquids Activity:   DVT Prophylaxis:  Lovenox 40 mg sq daily   CLINICALLY SIGNIFICANT STUDIES Basic Metabolic Panel:   Lab 08/07/12 0615 08/05/12 1540 08/05/12 0831  NA 133* -- 136  K 3.4* -- 3.5  CL 97 -- 98  CO2 26 -- 25  GLUCOSE 117* --  122*  BUN 7 -- 10  CREATININE 0.86 0.95 --  CALCIUM 9.6 -- 9.6  MG -- -- --  PHOS -- -- --   Liver Function Tests:   Lab 08/05/12 0831  AST 24  ALT 15  ALKPHOS 60  BILITOT 0.6  PROT 7.8  ALBUMIN 4.0   CBC:   Lab 08/07/12 0615 08/05/12 1540 08/05/12 0831  WBC 8.5 13.1* --  NEUTROABS -- -- 9.0*  HGB 13.4 14.7 --  HCT 39.3 42.3 --  MCV 90.1 92.0 --  PLT 241 253 --   Coagulation: No results found for this basename: LABPROT:4,INR:4 in the last 168 hours Cardiac Enzymes:   Lab 08/06/12 0120 08/05/12 2048 08/05/12 1442  CKTOTAL -- -- --  CKMB -- -- --  CKMBINDEX -- -- --  TROPONINI <0.30 <0.30 <0.30   Urinalysis:   Lab 08/05/12 1053  COLORURINE YELLOW  LABSPEC 1.015  PHURINE 7.5  GLUCOSEU NEGATIVE  HGBUR NEGATIVE  BILIRUBINUR NEGATIVE  KETONESUR NEGATIVE  PROTEINUR NEGATIVE  UROBILINOGEN 0.2  NITRITE NEGATIVE  LEUKOCYTESUR NEGATIVE   Lipid Panel    Component Value Date/Time   CHOL 164 08/05/2012 1124   TRIG 125 08/05/2012 1124   HDL 38* 08/05/2012 1124   CHOLHDL 4.3 08/05/2012 1124   VLDL 25  08/05/2012 1124   LDLCALC 101* 08/05/2012 1124   HgbA1C  Lab Results  Component Value Date   HGBA1C 5.5 08/05/2012    Urine Drug Screen:     Component Value Date/Time   LABOPIA NONE DETECTED 08/06/2012 1341   COCAINSCRNUR NONE DETECTED 08/06/2012 1341   LABBENZ NONE DETECTED 08/06/2012 1341   AMPHETMU NONE DETECTED 08/06/2012 1341   THCU NONE DETECTED 08/06/2012 1341   LABBARB NONE DETECTED 08/06/2012 1341    Alcohol Level: No results found for this basename: ETH:2 in the last 168 hours  CT of the brain  08/05/2012 Suspected left cerebellar infarct, age indeterminate but possibly subacute, new from 03/28/2012.  MRI of the brain  08/05/2012  Acute infarct left PICA territory.  Mild to moderate chronic microvascular ischemic changes, advanced for age.   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  Otherwise negative.     2D Echocardiogram  EF 50-55% with no source of embolus.   Carotid Doppler  There is evidence of elevated right internal carotid artery velocities in the 40-59% range, however there is no significant plaque morphology to support stenosis; elevated velocities are likely due to tortuosity. Vertebral arteries are patent with antegrade flow.  TEE   CXR  08/05/2012   No evidence of acute cardiopulmonary disease.     EKG  normal EKG, normal sinus rhythm, nonspecific ST wave changes.   Therapy Recommendations PT - outpatient; OT - none; ST - none  Physical Exam   Young [place] American gentleman currently not in distress.Awake alert. Afebrile. Head is nontraumatic. Neck is supple without bruit. Hearing is normal. Cardiac exam no murmur or gallop. Lungs are clear to auscultation. Distal pulses are well felt.   Neurological Exam ; Awake  Alert oriented x 3. Normal speech and language.eye movements full with mild left beating nystagmus.fundi were not visualized. Vision acuity and fields appear normal. Face symmetric. Tongue midline. Normal strength, tone, reflexes but mild impaired left finger to nose coordination. Normal sensation. Gait deferred.   ASSESSMENT [name] is a 51 y.o. male presenting with nausea, vomiting and dizziness . 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 Infarct felt to be embolic secondary to unknown etiology. He has a known history of substance abuse, including cocaine and THC, both associated with acute stroke.  Work up underway. On no antiplatelets prior to admission. Now on aspirin 325 mg orally every day for secondary stroke prevention. Patient with resultant nausea, vomiting and dizziness.  headache Hypertension History substance abuse including cocaine and THC, etoh use, UDS negative this admission Tobacco abuse Hyperlipidemia, LDL 101, not on statin PTA, now on lipitor 40, goal LDL < 100  Hospital day #  2  TREATMENT/PLAN  Continueaspirin 325 mg orally every day for secondary stroke prevention. TEE today 1230-1300 to look for embolic source. Please arrange with pts cardiologist or cardiologist of choice. Will need to be NPO after midnight. If positive for PFO (patent foramen ovale), check bilateral lower extremity venous dopplers to rule out DVT as possible source of stroke.  Stoke workup in the young should include labs to look for hypercoagulable source:  Hypercoagulable panel (except Factor V Leiden & Beta-2-glycoprotein, which are both associated with venous not arterial infarcts), Vasculitic labs (C3, C4, CH50, ESR, ANA) HIV & RPR Continue statin at discharge; otherwise document why not continued If TEE unrevealing, please schedule an outpatient TCD bubble study with emboli monitoring  with Dr. Pearlean Brownie in 1 month to further evaluate for possible PFO. Have patient call for appointment. (I have added to discharge instruction sheet). oif TEE negative, please schedule outpatient telemetry monitoring to assess patient for atrial fibrillation as source of stroke. May be arranged with patient's cardiologist, or cardiologist of choice.  Ok for discharge after TEE from neuro standpoint  Dr. Pearlean Brownie discussed with pt and RN.  Annie Main, MSN, RN, ANVP-BC, ANP-BC, [facility] Pager: [phone] 08/07/2012 8:47 AM  I have personally examined this patient, reviewed pertinent data, developed plan of care and degree with above. Delia Heady, MD Medical Director [facility] Pager: [phone] 08/07/2012 8:47 AM

## 2012-08-07 NOTE — ED Provider Notes (Signed)
Medical screening examination/treatment/procedure(s) were conducted as a shared visit with non-physician practitioner(s) and myself.  I personally evaluated the patient during the encounter; please see my previous note.  Laray Anger, DO 08/07/12 [phone]

## 2012-08-07 NOTE — Interval H&P Note (Signed)
History and Physical Interval Note:  08/07/2012 2:17 PM  [name]  has presented today for surgery, with the diagnosis of cva  The various methods of treatment have been discussed with the patient and family. After consideration of risks, benefits and other options for treatment, the patient has consented to  Procedure(s) (LRB) with comments: TRANSESOPHAGEAL ECHOCARDIOGRAM (TEE) (N/A) as a surgical intervention .  The patient's history has been reviewed, patient examined, no change in status, stable for surgery.  I have reviewed the patient's chart and labs.  Questions were answered to the patient's satisfaction.     [name].

## 2012-08-07 NOTE — Discharge Summary (Signed)
Internal Medicine Teaching [facility] Discharge Note  Name: [name] MRN: [id number] DOB: 1961-05-14 51 y.o.  Date of Admission: 08/05/2012  7:55 AM Date of Discharge: 08/07/2012 Attending Physician: Jonah Blue, DO  Discharge Diagnosis: Principal Problem:  *Stroke Active Problems:  Hypertension  Tobacco abuse  History of cocaine abuse  History of alcohol abuse   Discharge Medications:   Medication List     As of 08/07/2012  4:17 PM    STOP taking these medications         lisinopril 5 MG tablet   Commonly known as: PRINIVIL,ZESTRIL      TAKE these medications         acetaminophen 325 MG tablet   Commonly known as: TYLENOL   Take 2 tablets (650 mg total) by mouth every 6 (six) hours as needed (headache).      amLODipine 5 MG tablet   Commonly known as: NORVASC   Take 1 tablet (5 mg total) by mouth daily.      aspirin 325 MG tablet   Take 1 tablet (325 mg total) by mouth daily.      hydrochlorothiazide 12.5 MG capsule   Commonly known as: MICROZIDE   Take 1 capsule (12.5 mg total) by mouth daily.      ibuprofen 200 MG tablet   Commonly known as: ADVIL,MOTRIN   Take 400 mg by mouth every 6 (six) hours as needed. For pain      ondansetron 4 MG tablet   Commonly known as: ZOFRAN   Take 1 tablet (4 mg total) by mouth every 8 (eight) hours as needed.      pravastatin 20 MG tablet   Commonly known as: PRAVACHOL   Take 1 tablet (20 mg total) by mouth daily.          Disposition and follow-up:   [name] was discharged from [facility] in stable condition with swallowing ability at this baseline and improved balance but not as his basline.  At the hospital follow up visit please address: PCP: - He will need a repeat BMET as he was just started on HCTZ.            - Please assure compliance of his statin, amlodipine, and high dose aspirin.            - Please refer patient to a Cardiologist, he needs further work up  for his left cerebellar stroke. He may need an outpatient telemetry monitor.             - Please reassess the patient's need for [facility] PT-He was instructed not to drive for one week after his dicharge but may be ready for Outpatient PT after that period of time.   Neurology:  Per Dr. Pearlean Brownie patient the patient will need repeat bubble study  during his follow up visit. Per Neurology PA, Dr. Pearlean Brownie to follow up on hypercoagulable labs.    Follow-up Appointments: Follow-up Information    Follow up with Diamantina Providence., FNP. On 08/12/2012. (Follow up appointment at [facility] on Monday Dec 2nd 2013. Bring all of your medications with you. )    Contact information:   [address] [place] [phone]       Follow up with Gates Rigg, MD. (January 3rd at [facility]. )    Contact information:   [address], [address] [facility] [place] [address] [phone]         Discharge Orders  Future Orders Please Complete By Expires   Diet - low sodium heart healthy      Diet - low sodium heart healthy      Increase activity slowly      Increase activity slowly         Consultations: Treatment Team:  Md Stroke, MD  Procedures Performed:  Dg Chest 2 View  08/05/2012  *RADIOLOGY REPORT*  Clinical Data: Nausea/vomiting, dizziness  CHEST - 2 VIEW  Comparison: 05/08/2005  Findings: Lungs are essentially clear.  No focal consolidation. No pleural effusion or pneumothorax.  Cardiomediastinal silhouette is within normal limits.  Mild degenerative changes of the visualized thoracolumbar spine.  IMPRESSION: No evidence of acute cardiopulmonary disease.   Original Report Authenticated By: Charline Bills, M.D.    Ct Head Wo Contrast  08/05/2012  *RADIOLOGY REPORT*  Clinical Data: Bilateral temporal headache, dizziness, nausea/vomiting  CT HEAD WITHOUT CONTRAST  Technique:  Contiguous axial images were obtained from the base of the skull through the vertex without  contrast.  Comparison: 03/28/2012  Findings: New hypodensity in the left cerebellum (series 2/image 5), suspicious for cerebellar infarct, age indeterminate but favored to be subacute or chronic (although new from 03/28/2012).  No evidence of parenchymal hemorrhage or extra-axial fluid collection.  No mass effect or midline shift.  Basal cisterns remain patent.  Subcortical white matter and periventricular small vessel ischemic changes.  The visualized paranasal sinuses are essentially clear. The mastoid air cells are unopacified.  No evidence of calvarial fracture.  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  These results were called by telephone on 08/05/2012 at 0905 hrs to Dr. Clarene Duke, who verbally acknowledged these results.   Original Report Authenticated By: Charline Bills, M.D.    [facility] W Contrast  08/05/2012  *RADIOLOGY REPORT*  Clinical Data:  Ataxia with nausea and vomiting.  Stroke.  MRI HEAD WITHOUT CONTRAST MRA HEAD WITHOUT CONTRAST  Technique:  Multiplanar, multiecho pulse sequences of the brain and surrounding structures were obtained without intravenous contrast. Angiographic images of the head were obtained using MRA technique without contrast.  Comparison:  CT 08/05/2012  MRI HEAD  Findings:  Acute infarct in the left mid and inferior cerebellum compatible with acute left PICA infarct.  Brainstem is not involved to a significant degree.  No other acute infarct.  Numerous hyperintensities in the subcortical and deep white matter bilaterally, most consistent with chronic small vessel ischemia. Hyperintensity in the left pons, compatible with chronic ischemia. This does not show restricted diffusion.  Negative for hemorrhage or mass lesion.  No midline shift. Negative for hydrocephalus.  IMPRESSION: Acute infarct left PICA territory.  Mild to moderate chronic microvascular ischemic changes, advanced for age.  MRA HEAD   Findings: Both vertebral arteries are patent to the basilar.  The right PICA is patent.  Left PICA not visualized.  The basilar is patent.  Superior cerebellar and posterior cerebral arteries are patent bilaterally without stenosis.  Internal carotid artery is widely patent bilaterally.  Anterior and middle cerebral arteries are patent bilaterally.  Negative for cerebral aneurysm.  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  Otherwise negative.   Original Report Authenticated By: Janeece Riggers, M.D.    Mr Brain Wo Contrast  08/05/2012  *RADIOLOGY REPORT*  Clinical Data:  Ataxia with nausea and vomiting.  Stroke.  MRI HEAD WITHOUT CONTRAST MRA HEAD WITHOUT CONTRAST  Technique:  Multiplanar, multiecho pulse sequences of the brain and surrounding structures were  obtained without intravenous contrast. Angiographic images of the head were obtained using MRA technique without contrast.  Comparison:  CT 08/05/2012  MRI HEAD  Findings:  Acute infarct in the left mid and inferior cerebellum compatible with acute left PICA infarct.  Brainstem is not involved to a significant degree.  No other acute infarct.  Numerous hyperintensities in the subcortical and deep white matter bilaterally, most consistent with chronic small vessel ischemia. Hyperintensity in the left pons, compatible with chronic ischemia. This does not show restricted diffusion.  Negative for hemorrhage or mass lesion.  No midline shift. Negative for hydrocephalus.  IMPRESSION: Acute infarct left PICA territory.  Mild to moderate chronic microvascular ischemic changes, advanced for age.  MRA HEAD  Findings: Both vertebral arteries are patent to the basilar.  The right PICA is patent.  Left PICA not visualized.  The basilar is patent.  Superior cerebellar and posterior cerebral arteries are patent bilaterally without stenosis.  Internal carotid artery is widely patent bilaterally.  Anterior and middle cerebral arteries are patent  bilaterally.  Negative for cerebral aneurysm.  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  Otherwise negative.   Original Report Authenticated By: Janeece Riggers, M.D.     Admission HPI:  Patient is a 51 y.o. male with a PMHx of hypertension and alcohol use, who presents to [facility] for evaluation of nausea, headache, and dysequilibrium x 1.5 days. On 11/23, patient developed sudden onset severe nausea, vomiting, dizziness, and dysequilibrium after finishing a glass of wine at dinner. He felt off-balance and fell out of his chair to the right side. The nonbilious vomiting persisted throughout the night, but resolved by 11/24 AM. However, the symptoms of nausea and dizziness have been persistent, although somewhat improved from time of onset. On 11/24 AM, the patient also began experiencing severe, sharp, intermittent bitemporal and occipital headache with initial 10/10 severity, that has now improved to 5/10. He describes associated photophobia, but denies vision loss or acute vision change. During the longevity of his constellation of his symptoms, the patient did not experience focal weakness, ongoing slurred speech, vision loss, facial droop.  ROS reveals that the patient has also been experiencing intermittent chest pain after exertion for the last approximately 3 months, Described as a sharp central chest pain without radiation and without associated nausea, vomiting, shortness of breath.  During the ER course, the patient was initially treated with meclizine without improvement of symptoms. CT head shows area of suspected left cerebellar infarct of unclear acuity (although notably new from last head CT in 03/2012).    Hospital Course by problem list: ) Left cerebellar stroke. It is unclear at this point if ischemic (2/2 to his risk factors of untreated HTN and ongoing tobacco abuse) or due to embolism. Based on his MRA, this is most likely embolic. This stroke likely accounts for the  patient's presenting symptoms of dysequilibrium, nausea and vertigo. Further evaluation for potential sources of embolism is warranted. MRI and MRA of head shows acute infarct of left PICA and was negative for aneurysm or dissection. Patient passed swallow screen. Patient was not on aspirin before admission. UDS was negative. TSH was wnl (0.654), HbA1c was 5.5%, lipid profile demonstrated HDL of 38 and LDL of 101. Smoking cessation counseling was provided. His blood pressures were elevated on admission. However, we allowed for permissive hypertension initially in setting of stroke. He will be discharged with prescription for HCTZ 12.5 mg daily and Norvasc 5mg  daily. He will need outpatient follow up  for improved HTN control and BMET with initiation of HCTZ, a diuretic. His 2D echo showed EF of 50-55%, no PFO, and no signs of embolism. His TEE with bubble study showed no PFO, or ASD. The patient will need to follow up with Dr. Pearlean Brownie in 1 month. Dr. Pearlean Brownie will follow up on hypercoagulation, complement, and additional labs. Per Neurology, the patient likely needs to be seen by Cardiology but not urgently, his PCP can refer him to a Cardiologist for possible outpatient monitor and assessment of A. Fib. The patient was instructed not to drive for one week. 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   2) Hyponatremia. Mild hyponatremia today with sodium of 133. With his large left cerebellar stroke described above he certainly is at risk for SIADH but his hyponatremia is mild, and can be explained by his NPO status during this hospitalization.     3) Chest pain - Unclear etiology. Patient's history of having worsening pain after activity is concerning for cardiac cause of pain, TIMI score 0. Cocaine abuse could further contribute towards pain (although pt states remission at this time). CXR otherwise negative for mediastinal  widening, PTX, acute fractures. Troponin has been consistently below 0.30. Patient was encouraged to quit smoking. No new EKG findings. Patient will need Cardiology follow up as outpatient. TEE today with no PFO, ASD, and possible outpatient telemetry monitoring.  Per Neurology bubble study to be performed as outpatient (not with TEE secondary to sedation and poor Valsalva maneuver), and patient will ne contacted with appointment.    4) Tobacco abuse - patient has ongoing 1.5 ppd tobacco abuse. He was counseled on the adverse health effects of smoking, benefits of smoking cessation, and was urged to pursue smoking cessation. Will provide cessation counseling information in discharge instruction.   5) History of polysubstance abuse (including alcohol and crack-cocaine) - patient indicates cessation for several years, however, last UDS in 03/2012 was (+) for cocaine, perhaps less open to sharing experience with his mom in the room. UDS on 11/26 negative for cocaine. No signs of EtOH withdrawal today. Placed on CIWA observation but patient demonstrated no signs of withdrawal.   Dispo: Per PT, he will need 24 hour supervision upon his discharge and he reports that he will move in with his mother who is able to take care of him if this is needed. He will also need HH PT while he recovers and is unable to drive to outpatient PT. HH PT to possibly be changed to outpatient PT during his PCP visit.   Services Needed at time of discharge: Y = Yes, Blank = No  PT:  Yes   OT:    RN:    Equipment:    Other:      Discharge Vitals:  BP 219/168  Pulse 57  Temp 98.2 F (36.8 C) (Oral)  Resp 14  Ht 5\' 7"  (1.702 m)  Wt 176 lb 9.4 oz (80.1 kg)  BMI 27.66 kg/m2  SpO2 98%  Discharge Labs:  Results for orders placed during the hospital encounter of 08/05/12 (from the past 24 hour(s))  CBC     Status: Normal   Collection Time   08/07/12  6:15 AM      Component Value Range   WBC 8.5  4.0 - 10.5 K/uL   RBC  4.36  4.22 - 5.81 MIL/uL   Hemoglobin 13.4  13.0 -  17.0 g/dL   HCT 40.9  81.1 - 91.4 %   MCV 90.1  78.0 - 100.0 fL   MCH 30.7  26.0 - 34.0 pg   MCHC 34.1  30.0 - 36.0 g/dL   RDW 78.2  95.6 - 21.3 %   Platelets 241  150 - 400 K/uL  BASIC METABOLIC PANEL     Status: Abnormal   Collection Time   08/07/12  6:15 AM      Component Value Range   Sodium 133 (*) 135 - 145 mEq/L   Potassium 3.4 (*) 3.5 - 5.1 mEq/L   Chloride 97  96 - 112 mEq/L   CO2 26  19 - 32 mEq/L   Glucose, Bld 117 (*) 70 - 99 mg/dL   BUN 7  6 - 23 mg/dL   Creatinine, Ser 0.86  0.50 - 1.35 mg/dL   Calcium 9.6  8.4 - 57.8 mg/dL   GFR calc non Af Amer >90  >90 mL/min   GFR calc Af Amer >90  >90 mL/min  PHOSPHORUS     Status: Normal   Collection Time   08/07/12  6:15 AM      Component Value Range   Phosphorus 3.1  2.3 - 4.6 mg/dL  MAGNESIUM     Status: Normal   Collection Time   08/07/12  6:15 AM      Component Value Range   Magnesium 1.8  1.5 - 2.5 mg/dL  ANTITHROMBIN III     Status: Normal   Collection Time   08/07/12 12:14 PM      Component Value Range   AntiThromb III Func 117  75 - 120 %  SEDIMENTATION RATE     Status: Abnormal   Collection Time   08/07/12 12:14 PM      Component Value Range   Sed Rate 18 (*) 0 - 16 mm/hr    Signed: Sara Chu D 08/07/2012, 4:17 PM   Time Spent on Discharge: 60 minutes Services Ordered on Discharge: HH PT Equipment Ordered on Discharge: None

## 2012-08-07 NOTE — Progress Notes (Signed)
I have seen and evaluated the patient with medical student Hillery Jacks, MS3. For further information on the assessment and plan for this patient please refer to my note dated 08/06/12.  Sara Chu, MD PGY1

## 2012-08-07 NOTE — ED Provider Notes (Signed)
History     CSN: [id number]  Arrival date & time 08/07/12  1751   First MD Initiated Contact with Patient 08/07/12 1955      Chief Complaint  Patient presents with  . Dizziness  . Fall    (Consider location/radiation/quality/duration/timing/severity/associated sxs/prior treatment) HPI Pt d/c after admission for PICA infarct. Refused wheelchair and did not listen to d/c instructions. Pt lost balance and fell to ground. No head or neck trauma. No syncope. No focal weakness. C/o ongoing HA which has been persistent since admission.  Past Medical History  Diagnosis Date  . Hypertension   . Tobacco abuse   . Mood disorder     NOS. Admitted to [facility] for detox in 12/2005  . History of cocaine abuse   . History of alcohol abuse   . Hypercholesterolemia   . Anginal pain   . Hepatitis B 1991  . Stroke 08/05/2012    denies residual (08/05/2012)    Past Surgical History  Procedure Date  . Mouth surgery 2011    "had to cut tooth out of my gum" (08/05/2012)    Family History  Problem Relation Age of Onset  . Hypertension Mother   . Osteoarthritis Mother   . Hyperlipidemia Mother   . Prostate cancer Father   . CAD Maternal Grandmother   . Lung cancer Paternal Grandfather     tobacco abuse    History  Substance Use Topics  . Smoking status: Current Every Day Smoker -- 1.5 packs/day for 35 years    Types: Cigarettes  . Smokeless tobacco: Current User    Types: Snuff  . Alcohol Use: 1.2 oz/week    2 Cans of beer per week     Comment: 1-2 beers a week / previously drank heavily until 2009 6 pack or more of beer daily.      Review of Systems  Constitutional: Negative for fever and chills.  HENT: Negative for neck pain.   Eyes: Negative for visual disturbance.  Respiratory: Negative for shortness of breath.   Cardiovascular: Negative for chest pain.  Gastrointestinal: Negative for nausea, vomiting and abdominal pain.  Musculoskeletal: Negative for back pain.  Skin:  Negative for rash.  Neurological: Positive for headaches. Negative for dizziness, seizures, syncope, weakness, light-headedness and numbness.    Allergies  Review of patient's allergies indicates no known allergies.  Home Medications   Current Outpatient Rx  Name  Route  Sig  Dispense  Refill  . IBUPROFEN 200 MG PO TABS   Oral   Take 400 mg by mouth every 6 (six) hours as needed. For pain         . ACETAMINOPHEN 325 MG PO TABS   Oral   Take 2 tablets (650 mg total) by mouth every 6 (six) hours as needed (headache).   60 tablet   0   . AMLODIPINE BESYLATE 5 MG PO TABS   Oral   Take 1 tablet (5 mg total) by mouth daily.   30 tablet   2   . ASPIRIN 325 MG PO TABS   Oral   Take 1 tablet (325 mg total) by mouth daily.   60 tablet      . HYDROCHLOROTHIAZIDE 12.5 MG PO CAPS   Oral   Take 1 capsule (12.5 mg total) by mouth daily.   30 capsule   2   . ONDANSETRON HCL 4 MG PO TABS   Oral   Take 1 tablet (4 mg total) by mouth every 8 (eight)  hours as needed.   20 tablet   0   . PRAVASTATIN SODIUM 20 MG PO TABS   Oral   Take 1 tablet (20 mg total) by mouth daily.   30 tablet   2     BP 172/101  Pulse 65  Temp 98.1 F (36.7 C) (Oral)  Resp 16  SpO2 98%  Physical Exam  Nursing note and vitals reviewed. Constitutional: He is oriented to person, place, and time. He appears well-developed and well-nourished. No distress.  HENT:  Head: Normocephalic and atraumatic.  Mouth/Throat: Oropharynx is clear and moist.  Eyes: EOM are normal. Pupils are equal, round, and reactive to light.  Neck: Normal range of motion. Neck supple.       No posterior cervical TTP  Cardiovascular: Normal rate and regular rhythm.   Pulmonary/Chest: Effort normal and breath sounds normal. No respiratory distress. He has no wheezes. He has no rales.  Abdominal: Soft. Bowel sounds are normal. He exhibits no mass. There is no tenderness. There is no rebound and no guarding.  Musculoskeletal:  Normal range of motion. He exhibits no edema and no tenderness.  Neurological: He is alert and oriented to person, place, and time.       5/5 motor in all ext. Sensation intact. Finger to nose bl intact.   Skin: Skin is warm and dry. No rash noted. No erythema.  Psychiatric: He has a normal mood and affect. His behavior is normal.    ED Course  Procedures (including critical care time)  Labs Reviewed - No data to display No results found.   1. Fall       MDM  Medicine resident to see in ED and properly educate on expected course of symptoms and follow up. No acute medical issue present.         Loren Racer, MD 08/07/12 2105

## 2012-08-07 NOTE — Progress Notes (Signed)
Pt c/o of headache, BP read 184/95, tab ultram 50mg  given at 0245, ice pack also given as requested by pt,still observed to be moaning,Dr Qureshi(on call) paged and notified,ordered a one time iv toradol 30mg , same given at 0332,pt reassured,will however continue to monitor. Obasogie-Asidi, Halvor Behrend Efe

## 2012-08-07 NOTE — Progress Notes (Signed)
Occupational Therapy Treatment Patient Details Name: [name] MRN: [id number] DOB: October 29, 1960 Today's Date: 08/07/2012 Time: 4782-9562 OT Time Calculation (min): 27 min  OT Assessment / Plan / Recommendation Comments on Treatment Session Pt is progressing in balance and safety for ADL and mobility.  Focus of session today on safety with ADL and IADL in preparation for home with only intermittent help.  Also recommended pt consider seat for tub to enhance safety.    Follow Up Recommendations  No OT follow up    Barriers to Discharge       Equipment Recommendations  None recommended by OT    Recommendations for Other Services    Frequency Min 3X/week   Plan Discharge plan remains appropriate    Precautions / Restrictions Precautions Precautions: Fall Restrictions Weight Bearing Restrictions: No   Pertinent Vitals/Pain HA did not rate, premedicated    ADL  Grooming: Supervision/safety;Wash/dry hands;Teeth care Where Assessed - Grooming: Unsupported standing Upper Body Dressing: Supervision/safety Where Assessed - Upper Body Dressing: Unsupported standing Lower Body Dressing: Supervision/safety Where Assessed - Lower Body Dressing: Unsupported sitting Toilet Transfer: Supervision/safety Toilet Transfer Method: Sit to stand Toilet Transfer Equipment: Comfort height toilet Toileting - Clothing Manipulation and Hygiene: Modified independent Where Assessed - Glass blower/designer Manipulation and Hygiene: Standing Equipment Used: Gait belt Transfers/Ambulation Related to ADLs: pt ambulated with supervision and use of IV pole  ADL Comments: Instructed pt in balance/ fall risk factors related to ADL/IADL, including sitting to donn pants, carrying objects whether heavy or those requiring both hands, stepping over edge of tub.  Also educated pt on importance of changing position slowly to allow himself to acclimate with bed mobilty, sit to stand, and with gathering objects from  various heights.    OT Diagnosis:    OT Problem List:   OT Treatment Interventions:     OT Goals Acute Rehab OT Goals OT Goal Formulation: With patient Time For Goal Achievement: 08/13/12 Potential to Achieve Goals: Good ADL Goals Pt Will Perform Grooming: Independently;Unsupported;Standing at sink ADL Goal: Grooming - Progress: Progressing toward goals Pt Will Transfer to Toilet: Independently;Ambulation;Regular height toilet ADL Goal: Toilet Transfer - Progress: Progressing toward goals Pt Will Perform Toileting - Clothing Manipulation: Independently;Standing ADL Goal: Toileting - Clothing Manipulation - Progress: Met Pt Will Perform Toileting - Hygiene: Independently;Sit to stand from 3-in-1/toilet ADL Goal: Toileting - Hygiene - Progress: Met Pt Will Perform Tub/Shower Transfer: with modified independence;Ambulation Miscellaneous OT Goals Miscellaneous OT Goal #1: Pt will be able to go around his room at an independent level to gather items in his closets and drawers to work on his balance. OT Goal: Miscellaneous Goal #1 - Progress: Progressing toward goals  Visit Information  Last OT Received On: 08/07/12 Assistance Needed: +1    Subjective Data      Prior Functioning       Cognition  Overall Cognitive Status: Appears within functional limits for tasks assessed/performed Arousal/Alertness: Awake/alert Orientation Level: Appears intact for tasks assessed Behavior During Session: [facility] for tasks performed    Mobility  Shoulder Instructions Bed Mobility Bed Mobility: Supine to Sit;Sit to Supine Supine to Sit: HOB flat;6: Modified independent (Device/Increase time) Sit to Supine: 6: Modified independent (Device/Increase time);HOB flat Transfers Sit to Stand: 5: Supervision Stand to Sit: 5: Supervision       Exercises      Balance     End of Session OT - End of Session Activity Tolerance: Patient tolerated treatment well Patient left: in bed;with bed alarm  set  GO     Evern Bio 08/07/2012, 11:34 AM [phone]

## 2012-08-07 NOTE — Discharge Summary (Signed)
INTERNAL MEDICINE TEACHING SERVICE Attending Note  Date: 08/07/2012  Patient name: [name]  Medical record number: [id number]  Date of birth: December 08, 1960    This patient has been seen and discussed with the house staff. Please see their note for complete details. I concur with their findings.    Jonah Blue, DO  08/07/2012, 4:59 PM

## 2012-08-07 NOTE — Progress Notes (Signed)
Subjective: He had worsening headache last night that responded to Toradol and ice pack. He states that his headache is better today.   Objective: Vital signs in last 24 hours: Filed Vitals:   08/06/12 2200 08/07/12 0200 08/07/12 0535 08/07/12 0600  BP: 189/97 184/95  171/102  Pulse: 78 68  57  Temp: 97.6 F (36.4 C) 98 F (36.7 C)  97.4 F (36.3 C)  TempSrc: Oral Oral  Oral  Resp: 18 18  18   Height:   5\' 7"  (1.702 m)   Weight:   176 lb 9.4 oz (80.1 kg)   SpO2: 99% 100%  100%   Weight change:   Intake/Output Summary (Last 24 hours) at 08/07/12 0802 Last data filed at 08/06/12 1245  Gross per 24 hour  Intake    240 ml  Output      0 ml  Net    240 ml   Vitals reviewed.  General: resting in bed, in NAD  HEENT: no scleral icterus  Cardiac: RRR, no rubs, murmurs or gallops  Pulm: clear to auscultation bilaterally, no wheezes, rales, or rhonchi  Abd: soft, nontender, nondistended, BS present  Ext: warm and well perfused, no pedal edema  Neuro: alert and oriented X3, cranial nerves II-XII grossly intact, strength and sensation to light touch equal in bilateral upper and lower extremities  Lab Results: Basic Metabolic Panel:  Lab 08/07/12 0454 08/05/12 1540 08/05/12 0831  NA 133* -- 136  K 3.4* -- 3.5  CL 97 -- 98  CO2 26 -- 25  GLUCOSE 117* -- 122*  BUN 7 -- 10  CREATININE 0.86 0.95 --  CALCIUM 9.6 -- 9.6  MG -- -- --  PHOS -- -- --   Liver Function Tests:  Lab 08/05/12 0831  AST 24  ALT 15  ALKPHOS 60  BILITOT 0.6  PROT 7.8  ALBUMIN 4.0    Lab 08/05/12 0831  LIPASE 30  AMYLASE --   CBC:  Lab 08/07/12 0615 08/05/12 1540 08/05/12 0831  WBC 8.5 13.1* --  NEUTROABS -- -- 9.0*  HGB 13.4 14.7 --  HCT 39.3 42.3 --  MCV 90.1 92.0 --  PLT 241 253 --   Cardiac Enzymes:  Lab 08/06/12 0120 08/05/12 2048 08/05/12 1442  CKTOTAL -- -- --  CKMB -- -- --  CKMBINDEX -- -- --  TROPONINI <0.30 <0.30 <0.30   Hemoglobin A1C:  Lab 08/05/12 1124  HGBA1C 5.5     Fasting Lipid Panel:  Lab 08/05/12 1124  CHOL 164  HDL 38*  LDLCALC 101*  TRIG 125  CHOLHDL 4.3  LDLDIRECT --   Thyroid Function Tests:  Lab 08/05/12 1124  TSH 0.654  T4TOTAL --  FREET4 --  T3FREE --  THYROIDAB --   Coagulation: No results found for this basename: LABPROT:4,INR:4 in the last 168 hours Anemia Panel: No results found for this basename: VITAMINB12,FOLATE,FERRITIN,TIBC,IRON,RETICCTPCT in the last 168 hours Urine Drug Screen: Drugs of Abuse     Component Value Date/Time   LABOPIA NONE DETECTED 08/06/2012 1341   COCAINSCRNUR NONE DETECTED 08/06/2012 1341   LABBENZ NONE DETECTED 08/06/2012 1341   AMPHETMU NONE DETECTED 08/06/2012 1341   THCU NONE DETECTED 08/06/2012 1341   LABBARB NONE DETECTED 08/06/2012 1341    Alcohol Level: No results found for this basename: ETH:2 in the last 168 hours Urinalysis:  Lab 08/05/12 1053  COLORURINE YELLOW  LABSPEC 1.015  PHURINE 7.5  GLUCOSEU NEGATIVE  HGBUR NEGATIVE  BILIRUBINUR NEGATIVE  KETONESUR NEGATIVE  PROTEINUR  NEGATIVE  UROBILINOGEN 0.2  NITRITE NEGATIVE  LEUKOCYTESUR NEGATIVE   Micro Results: Recent Results (from the past 240 hour(s))  URINE CULTURE     Status: Normal   Collection Time   08/05/12 10:53 AM      Component Value Range Status Comment   Specimen Description URINE, RANDOM   Final    Special Requests NONE   Final    Culture  Setup Time 08/05/2012 11:26   Final    Colony Count NO GROWTH   Final    Culture NO GROWTH   Final    Report Status 08/06/2012 FINAL   Final   MRSA PCR SCREENING     Status: Normal   Collection Time   08/06/12  1:17 PM      Component Value Range Status Comment   MRSA by PCR NEGATIVE  NEGATIVE Final    Studies/Results: Dg Chest 2 View  08/05/2012  *RADIOLOGY REPORT*  Clinical Data: Nausea/vomiting, dizziness  CHEST - 2 VIEW  Comparison: 05/08/2005  Findings: Lungs are essentially clear.  No focal consolidation. No pleural effusion or pneumothorax.   Cardiomediastinal silhouette is within normal limits.  Mild degenerative changes of the visualized thoracolumbar spine.  IMPRESSION: No evidence of acute cardiopulmonary disease.   Original Report Authenticated By: Charline Bills, M.D.    Ct Head Wo Contrast  08/05/2012  *RADIOLOGY REPORT*  Clinical Data: Bilateral temporal headache, dizziness, nausea/vomiting  CT HEAD WITHOUT CONTRAST  Technique:  Contiguous axial images were obtained from the base of the skull through the vertex without contrast.  Comparison: 03/28/2012  Findings: New hypodensity in the left cerebellum (series 2/image 5), suspicious for cerebellar infarct, age indeterminate but favored to be subacute or chronic (although new from 03/28/2012).  No evidence of parenchymal hemorrhage or extra-axial fluid collection.  No mass effect or midline shift.  Basal cisterns remain patent.  Subcortical white matter and periventricular small vessel ischemic changes.  The visualized paranasal sinuses are essentially clear. The mastoid air cells are unopacified.  No evidence of calvarial fracture.  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  These results were called by telephone on 08/05/2012 at 0905 hrs to Dr. Clarene Duke, who verbally acknowledged these results.   Original Report Authenticated By: Charline Bills, M.D.    Mr [facility] W Contrast  08/05/2012  *RADIOLOGY REPORT*  Clinical Data:  Ataxia with nausea and vomiting.  Stroke.  MRI HEAD WITHOUT CONTRAST MRA HEAD WITHOUT CONTRAST  Technique:  Multiplanar, multiecho pulse sequences of the brain and surrounding structures were obtained without intravenous contrast. Angiographic images of the head were obtained using MRA technique without contrast.  Comparison:  CT 08/05/2012  MRI HEAD  Findings:  Acute infarct in the left mid and inferior cerebellum compatible with acute left PICA infarct.  Brainstem is not involved to a significant  degree.  No other acute infarct.  Numerous hyperintensities in the subcortical and deep white matter bilaterally, most consistent with chronic small vessel ischemia. Hyperintensity in the left pons, compatible with chronic ischemia. This does not show restricted diffusion.  Negative for hemorrhage or mass lesion.  No midline shift. Negative for hydrocephalus.  IMPRESSION: Acute infarct left PICA territory.  Mild to moderate chronic microvascular ischemic changes, advanced for age.  MRA HEAD  Findings: Both vertebral arteries are patent to the basilar.  The right PICA is patent.  Left PICA not visualized.  The basilar is patent.  Superior cerebellar and posterior cerebral arteries are patent  bilaterally without stenosis.  Internal carotid artery is widely patent bilaterally.  Anterior and middle cerebral arteries are patent bilaterally.  Negative for cerebral aneurysm.  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  Otherwise negative.   Original Report Authenticated By: Janeece Riggers, M.D.    Mr Brain Wo Contrast  08/05/2012  *RADIOLOGY REPORT*  Clinical Data:  Ataxia with nausea and vomiting.  Stroke.  MRI HEAD WITHOUT CONTRAST MRA HEAD WITHOUT CONTRAST  Technique:  Multiplanar, multiecho pulse sequences of the brain and surrounding structures were obtained without intravenous contrast. Angiographic images of the head were obtained using MRA technique without contrast.  Comparison:  CT 08/05/2012  MRI HEAD  Findings:  Acute infarct in the left mid and inferior cerebellum compatible with acute left PICA infarct.  Brainstem is not involved to a significant degree.  No other acute infarct.  Numerous hyperintensities in the subcortical and deep white matter bilaterally, most consistent with chronic small vessel ischemia. Hyperintensity in the left pons, compatible with chronic ischemia. This does not show restricted diffusion.  Negative for hemorrhage or mass lesion.  No midline shift. Negative for  hydrocephalus.  IMPRESSION: Acute infarct left PICA territory.  Mild to moderate chronic microvascular ischemic changes, advanced for age.  MRA HEAD  Findings: Both vertebral arteries are patent to the basilar.  The right PICA is patent.  Left PICA not visualized.  The basilar is patent.  Superior cerebellar and posterior cerebral arteries are patent bilaterally without stenosis.  Internal carotid artery is widely patent bilaterally.  Anterior and middle cerebral arteries are patent bilaterally.  Negative for cerebral aneurysm.  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  Otherwise negative.   Original Report Authenticated By: Janeece Riggers, M.D.    Medications: I have reviewed the patient's current medications. Scheduled Meds:   . aspirin  325 mg Oral Daily  . atorvastatin  40 mg Oral q1800  . [COMPLETED] ketorolac  30 mg Intravenous Once  . [DISCONTINUED] enoxaparin  40 mg Subcutaneous Q24H  . [DISCONTINUED] ketorolac  20 mg Oral Once   Continuous Infusions:   . sodium chloride    . sodium chloride 100 mL/hr at 08/07/12 0658   PRN Meds:.acetaminophen, ondansetron, ondansetron, senna-docusate, traMADol, [DISCONTINUED] traMADol, [DISCONTINUED] traMADol Assessment/Plan: Mr. Lemming is a 51 year old male with a PMHx of hypertension (untreated), significant smoking history, and previous cocaine use who was admitted on 08/05/2012 with symptoms of headache and worsening dysequilibrium. Head imaging demonstrates acute infarct of the left PICA.   1) Left cerebellar stroke. It is unclear at this point if ischemic (2/2 to his risk factors of untreated HTN and ongoing tobacco abuse) or due to embolism. This stroke likely accounts for the patient's presenting symptoms of dysequilibrium, nausea and vertigo. Further evaluation for potential sources of embolism is warranted. MRI and MRA of head shows acute infarct of left PICA and was negative for aneurysm or dissection. Patient passed swallow  screen. Patient was not on aspirin before admission. UDS was negative. TSH was wnl (0.654), HbA1c was 5.5%, lipid profile demonstrated HDL of 38 and LDL of 101. Smoking cessation counseling was provided. Blood pressures elevated on admission. However, will allow for permissive hypertension initially in setting of stroke but he will need outpatient follow up for improved HTN control.  -2D echo done today with EF of 50-55%, no PFO, no signs on embolism.  -Pt will need follow up with Dr. Pearlean Brownie in 1 month, he will follow up on hypercoagulation, complement, and  additional labs.  -Per Neurology, pt likely needs to be seen by Cardiology but not urgently, his PCP can refer him to a Cardiologist for possible outpatient monitor and assessment of A. Fib.  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 Plan: -Continue telemetry  - Neurology consulted, appreciate recommendations.  - TEE this afternoon, possibly with TCD bubble study - Continue aspirin, statin and heart healthy diet (goal LDL is <100, LDL on 11/25 161) - Resume home Lisinopril 5mg , pt is now >72hours post stroke.  - Continue Tylenol and tramadol PRN for headache.  2) Hyponatremia. Mild hyponatremia today with sodium of 133. With his large left cerebellar stroke described above he certainly is at risk for SIADH.  -Will check urine Na and osmolality.   3) Chest pain - unclear cause. Patient's history of having worsening pain after activity is concerning for cardiac cause of pain, TIMI score 0. Cocaine abuse could further contribute towards pain (although pt states remission at this time). CXR otherwise negative for mediastinal widening, PTX, acute fractures. Troponin has been consistently below 0.30. Patient was encouraged to quit smoking. No new EKG findings. Patient will need Cardiology follow up as outpatient.  -TEE today for PFO evaluation.  -  Per Neurology bubble study to be performed as outpatient (not with TEE 2/2 sedation and poor Valsalva maneuver)  4) Tobacco abuse - patient has ongoing 1.5 ppd tobacco abuse. He was counseled on the adverse health effects of smoking, benefits of smoking cessation, and was urged to pursue smoking cessation.  - Will provide cessation counseling information in discharge instruction.   5) History of polysubstance abuse (including alcohol and crack-cocaine) - patient indicates cessation for several years, however, last UDS in 03/2012 was (+) for cocaine, perhaps less open to sharing experience with his mom in the room. UDS on 11/26 negative for cocaine. No signs of EtOH withdrawal today.  - Placed on CIWA observation (will not add full protocol unless withdrawing).   Dispo: Disposition is deferred at this time, awaiting improvement of current medical problems. Anticipated discharge in approximately today. Per PT, he will need 24 hour supervision upon his discharge and he reports that he will move in with his mother who is able to take care of him if this is needed. He will also need HH PT while he recovers. The patient does not have a current PCP, therefore will not be requiring OPC follow-up after discharge.  The patient does not have transportation limitations that hinder transportation to clinic appointments.  .Services Needed at time of discharge: Y = Yes, Blank = No  PT:  Yes  OT:    RN:    Equipment:    Other:        LOS: 2 days   Sara Chu D 08/07/2012, 8:02 AM

## 2012-08-07 NOTE — Progress Notes (Signed)
Echocardiogram Echocardiogram Transesophageal has been performed.  [name] 08/07/2012, 2:53 PM

## 2012-08-07 NOTE — ED Notes (Signed)
MD at bedside. 

## 2012-08-07 NOTE — Progress Notes (Signed)
This patient was brought to the nurses station by a transporter from Radiology. The transporter asked if this was our patient. I informed the transporter that the patient had been recently discharged from the unit. The transporter said he found the patient out in the hallway on the ground. The patient was told to sit down since he looked drowsy. He was A&O x4. His x-wife was here looking for him, and had just finished talking to the nurse who discharged the patient from the unit. The x-wife seen the patient sitting up at the nurse's station and began to question the patient as to what had happened. The patient said he left the unit angrily because he didn't think he should be discharged, and he was upset because we were "starving" him. The nurse, [place], who discharged the patient was asked to come look at the patient and the [facility] was called for further instructions. The [facility] recommended that we should call the doctor who wrote the patient's discharge instructions. The Internal medicine teaching service was paged, and called [place] back about 15 minutes later. Two residents came to see the patient while he was still sitting at the 4[facility] nurses station. They suggested we take the patient to the ED for further evaluation since he seemed to have a decreased level of consciousness, although he could follow commands and was oriented. The patient did complain of pain on the left side of his head. The doctors spoke with the family about the plan of care. The patient was transported to the ED from [facility] with myself and a tech via wheelchair. RN Efraim Kaufmann was given and update on the patient and left in their care.

## 2012-08-07 NOTE — Progress Notes (Signed)
  PT Cancellation Note  Patient Details Name: GUISEPPE FLANAGAN MRN: [id number] DOB: 1960-10-09   Cancelled Treatment:    Reason Eval/Treat Not Completed:  (pt agitated and ready to leave. Patient ambulating in hallway looking for RN to assist in d/c.   Marcene Brawn 08/07/2012, 4:35 PM  Lewis Shock, PT, DPT Pager #: [phone] Office #: [phone]

## 2012-08-07 NOTE — Progress Notes (Signed)
Internal Medicine Brief Interval Progress Note  S: [name], a 51 yo man, history of HTN and alcohol use, presented 11/25 and was found to have an acute stroke.  The patient was discharged today.  After discharge orders were placed, the patient, who was unhappy with the amount/type of food he had received today, decided to walk out himself without the aid of a nurse or wheelchair.  After getting out of bed, he fell to the ground, with his knees hitting the ground first, then catching himself with his hands.  His torso and head did not hit the ground.  He was helped up by a nearby hospital employee, and sent to the ED for further evaluation.  No LOC, nausea, vomiting, headache, or new extremity weakness.  O: Filed Vitals:   08/07/12 1759 08/07/12 2006  BP: 129/95 172/101  Pulse: 65   Temp: 98.1 F (36.7 C)   TempSrc: Oral   Resp: 14 16  SpO2: 97% 98%   PEX General: alert, somewhat unhappy, but NAD HEENT: pupils equal round and reactive to light, vision grossly intact, oropharynx clear and non-erythematous  Neck: supple, no lymphadenopathy Lungs: clear to ascultation bilaterally, normal work of respiration, no wheezes, rales, ronchi Heart: regular rate and rhythm, no murmurs, gallops, or rubs Abdomen: soft, non-tender, non-distended, normal bowel sounds Extremities:no cyanosis, clubbing, or edema Neurologic: alert & oriented X3, cranial nerves II-XII intact, strength 5/5 throughout bilateral upper and lower extremities, sensation grossly intact to light touch.  A/P: The patient is a 51 yo man, recently discharged after acute stroke, presenting after a fall to his knees and hands without head trauma.    1) Mechanical fall - etiology likely secondary to neuro changes from stroke 2 days ago.  No new neuro findings identified on examination.  No concern for head trauma or other acute new intracranial abnormality.  Discussed with ED attending, and we agree that the patient is clear for  discharge.   -Patient amenable to discharge home.   -All questions answered -Patient has follow-up with Dayton Scrape, FNP, 08/12/12.   Awanda Mink 08/07/2012, 9:12 PM

## 2012-08-07 NOTE — ED Notes (Addendum)
Pt states "I got back from TEE and after starving for 15hrs they only gave me a [place] sandwich. Then they said that I was gonna go home and I don't feel like that was right because I still don't feel right. Then outpatient services was calling me and asking me all this stuff and that got me upset. And nobody even asked me how I was feeling. So then they were gonna get my papers and a wheelchair and I said to myself "no" and just walked out." Pt states "someone found me laying on the floor and said I should come here." Pt states he feels weak and has a headache. States "i don't think I hit my head."

## 2012-08-07 NOTE — Care Management Note (Signed)
    Page 1 of 1   08/07/2012     4:15:47 PM   CARE MANAGEMENT NOTE 08/07/2012  Patient:  [name], [name]   Account Number:  [id number]  Date Initiated:  08/07/2012  Documentation initiated by:  Letha Cape  Subjective/Objective Assessment:   dx cva  admit- lives alone     Action/Plan:   pt eval- recs hhpt   Anticipated DC Date:  08/07/2012   Anticipated DC Plan:  HOME W HOME HEALTH SERVICES      DC Planning Services  CM consult      Choice offered to / List presented to:  C-1 Patient        HH arranged  HH-2 PT      [facility] agency  [facility]   Status of service:  Completed, signed off Medicare Important Message given?   (If response is "NO", the following Medicare IM given date fields will be blank) Date Medicare IM given:   Date Additional Medicare IM given:    Discharge Disposition:  HOME W HOME HEALTH SERVICES  Per UR Regulation:  Reviewed for med. necessity/level of care/duration of stay  If discussed at Long Length of Stay Meetings, dates discussed:    Comments:  08/07/12 16:10 Letha Cape RN, BSN [phone] patient lives alone, received call from RN, stating thay patient needs hhpt. patient agreed to [name], spoke with MD and she states she will put him on the statin on the $4 list at [facility] and the other meds will be on $4 list as well.

## 2012-08-07 NOTE — H&P (View-Only) (Signed)
Subjective: He had worsening headache last night that responded to Toradol and ice pack. He states that his headache is better today.   Objective: Vital signs in last 24 hours: Filed Vitals:   08/06/12 2200 08/07/12 0200 08/07/12 0535 08/07/12 0600  BP: 189/97 184/95  171/102  Pulse: 78 68  57  Temp: 97.6 F (36.4 C) 98 F (36.7 C)  97.4 F (36.3 C)  TempSrc: Oral Oral  Oral  Resp: 18 18  18  Height:   5' 7" (1.702 m)   Weight:   176 lb 9.4 oz (80.1 kg)   SpO2: 99% 100%  100%   Weight change:   Intake/Output Summary (Last 24 hours) at 08/07/12 0802 Last data filed at 08/06/12 1245  Gross per 24 hour  Intake    240 ml  Output      0 ml  Net    240 ml   Vitals reviewed.  General: resting in bed, in NAD  HEENT: no scleral icterus  Cardiac: RRR, no rubs, murmurs or gallops  Pulm: clear to auscultation bilaterally, no wheezes, rales, or rhonchi  Abd: soft, nontender, nondistended, BS present  Ext: warm and well perfused, no pedal edema  Neuro: alert and oriented X3, cranial nerves II-XII grossly intact, strength and sensation to light touch equal in bilateral upper and lower extremities  Lab Results: Basic Metabolic Panel:  Lab 08/07/12 0615 08/05/12 1540 08/05/12 0831  NA 133* -- 136  K 3.4* -- 3.5  CL 97 -- 98  CO2 26 -- 25  GLUCOSE 117* -- 122*  BUN 7 -- 10  CREATININE 0.86 0.95 --  CALCIUM 9.6 -- 9.6  MG -- -- --  PHOS -- -- --   Liver Function Tests:  Lab 08/05/12 0831  AST 24  ALT 15  ALKPHOS 60  BILITOT 0.6  PROT 7.8  ALBUMIN 4.0    Lab 08/05/12 0831  LIPASE 30  AMYLASE --   CBC:  Lab 08/07/12 0615 08/05/12 1540 08/05/12 0831  WBC 8.5 13.1* --  NEUTROABS -- -- 9.0*  HGB 13.4 14.7 --  HCT 39.3 42.3 --  MCV 90.1 92.0 --  PLT 241 253 --   Cardiac Enzymes:  Lab 08/06/12 0120 08/05/12 2048 08/05/12 1442  CKTOTAL -- -- --  CKMB -- -- --  CKMBINDEX -- -- --  TROPONINI <0.30 <0.30 <0.30   Hemoglobin A1C:  Lab 08/05/12 1124  HGBA1C 5.5     Fasting Lipid Panel:  Lab 08/05/12 1124  CHOL 164  HDL 38*  LDLCALC 101*  TRIG 125  CHOLHDL 4.3  LDLDIRECT --   Thyroid Function Tests:  Lab 08/05/12 1124  TSH 0.654  T4TOTAL --  FREET4 --  T3FREE --  THYROIDAB --   Coagulation: No results found for this basename: LABPROT:4,INR:4 in the last 168 hours Anemia Panel: No results found for this basename: VITAMINB12,FOLATE,FERRITIN,TIBC,IRON,RETICCTPCT in the last 168 hours Urine Drug Screen: Drugs of Abuse     Component Value Date/Time   LABOPIA NONE DETECTED 08/06/2012 1341   COCAINSCRNUR NONE DETECTED 08/06/2012 1341   LABBENZ NONE DETECTED 08/06/2012 1341   AMPHETMU NONE DETECTED 08/06/2012 1341   THCU NONE DETECTED 08/06/2012 1341   LABBARB NONE DETECTED 08/06/2012 1341    Alcohol Level: No results found for this basename: ETH:2 in the last 168 hours Urinalysis:  Lab 08/05/12 1053  COLORURINE YELLOW  LABSPEC 1.015  PHURINE 7.5  GLUCOSEU NEGATIVE  HGBUR NEGATIVE  BILIRUBINUR NEGATIVE  KETONESUR NEGATIVE  PROTEINUR   NEGATIVE  UROBILINOGEN 0.2  NITRITE NEGATIVE  LEUKOCYTESUR NEGATIVE   Micro Results: Recent Results (from the past 240 hour(s))  URINE CULTURE     Status: Normal   Collection Time   08/05/12 10:53 AM      Component Value Range Status Comment   Specimen Description URINE, RANDOM   Final    Special Requests NONE   Final    Culture  Setup Time 08/05/2012 11:26   Final    Colony Count NO GROWTH   Final    Culture NO GROWTH   Final    Report Status 08/06/2012 FINAL   Final   MRSA PCR SCREENING     Status: Normal   Collection Time   08/06/12  1:17 PM      Component Value Range Status Comment   MRSA by PCR NEGATIVE  NEGATIVE Final    Studies/Results: Dg Chest 2 View  08/05/2012  *RADIOLOGY REPORT*  Clinical Data: Nausea/vomiting, dizziness  CHEST - 2 VIEW  Comparison: 05/08/2005  Findings: Lungs are essentially clear.  No focal consolidation. No pleural effusion or pneumothorax.   Cardiomediastinal silhouette is within normal limits.  Mild degenerative changes of the visualized thoracolumbar spine.  IMPRESSION: No evidence of acute cardiopulmonary disease.   Original Report Authenticated By: Sriyesh Krishnan, M.D.    Ct Head Wo Contrast  08/05/2012  *RADIOLOGY REPORT*  Clinical Data: Bilateral temporal headache, dizziness, nausea/vomiting  CT HEAD WITHOUT CONTRAST  Technique:  Contiguous axial images were obtained from the base of the skull through the vertex without contrast.  Comparison: 03/28/2012  Findings: New hypodensity in the left cerebellum (series 2/image 5), suspicious for cerebellar infarct, age indeterminate but favored to be subacute or chronic (although new from 03/28/2012).  No evidence of parenchymal hemorrhage or extra-axial fluid collection.  No mass effect or midline shift.  Basal cisterns remain patent.  Subcortical white matter and periventricular small vessel ischemic changes.  The visualized paranasal sinuses are essentially clear. The mastoid air cells are unopacified.  No evidence of calvarial fracture.  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  These results were called by telephone on 08/05/2012 at 0905 hrs to Dr. McManus, who verbally acknowledged these results.   Original Report Authenticated By: Sriyesh Krishnan, M.D.    Mr Mra Head W Contrast  08/05/2012  *RADIOLOGY REPORT*  Clinical Data:  Ataxia with nausea and vomiting.  Stroke.  MRI HEAD WITHOUT CONTRAST MRA HEAD WITHOUT CONTRAST  Technique:  Multiplanar, multiecho pulse sequences of the brain and surrounding structures were obtained without intravenous contrast. Angiographic images of the head were obtained using MRA technique without contrast.  Comparison:  CT 08/05/2012  MRI HEAD  Findings:  Acute infarct in the left mid and inferior cerebellum compatible with acute left PICA infarct.  Brainstem is not involved to a significant  degree.  No other acute infarct.  Numerous hyperintensities in the subcortical and deep white matter bilaterally, most consistent with chronic small vessel ischemia. Hyperintensity in the left pons, compatible with chronic ischemia. This does not show restricted diffusion.  Negative for hemorrhage or mass lesion.  No midline shift. Negative for hydrocephalus.  IMPRESSION: Acute infarct left PICA territory.  Mild to moderate chronic microvascular ischemic changes, advanced for age.  MRA HEAD  Findings: Both vertebral arteries are patent to the basilar.  The right PICA is patent.  Left PICA not visualized.  The basilar is patent.  Superior cerebellar and posterior cerebral arteries are patent   bilaterally without stenosis.  Internal carotid artery is widely patent bilaterally.  Anterior and middle cerebral arteries are patent bilaterally.  Negative for cerebral aneurysm.  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  Otherwise negative.   Original Report Authenticated By: David Clark, M.D.    Mr Brain Wo Contrast  08/05/2012  *RADIOLOGY REPORT*  Clinical Data:  Ataxia with nausea and vomiting.  Stroke.  MRI HEAD WITHOUT CONTRAST MRA HEAD WITHOUT CONTRAST  Technique:  Multiplanar, multiecho pulse sequences of the brain and surrounding structures were obtained without intravenous contrast. Angiographic images of the head were obtained using MRA technique without contrast.  Comparison:  CT 08/05/2012  MRI HEAD  Findings:  Acute infarct in the left mid and inferior cerebellum compatible with acute left PICA infarct.  Brainstem is not involved to a significant degree.  No other acute infarct.  Numerous hyperintensities in the subcortical and deep white matter bilaterally, most consistent with chronic small vessel ischemia. Hyperintensity in the left pons, compatible with chronic ischemia. This does not show restricted diffusion.  Negative for hemorrhage or mass lesion.  No midline shift. Negative for  hydrocephalus.  IMPRESSION: Acute infarct left PICA territory.  Mild to moderate chronic microvascular ischemic changes, advanced for age.  MRA HEAD  Findings: Both vertebral arteries are patent to the basilar.  The right PICA is patent.  Left PICA not visualized.  The basilar is patent.  Superior cerebellar and posterior cerebral arteries are patent bilaterally without stenosis.  Internal carotid artery is widely patent bilaterally.  Anterior and middle cerebral arteries are patent bilaterally.  Negative for cerebral aneurysm.  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  Otherwise negative.   Original Report Authenticated By: David Clark, M.D.    Medications: I have reviewed the patient's current medications. Scheduled Meds:   . aspirin  325 mg Oral Daily  . atorvastatin  40 mg Oral q1800  . [COMPLETED] ketorolac  30 mg Intravenous Once  . [DISCONTINUED] enoxaparin  40 mg Subcutaneous Q24H  . [DISCONTINUED] ketorolac  20 mg Oral Once   Continuous Infusions:   . sodium chloride    . sodium chloride 100 mL/hr at 08/07/12 0658   PRN Meds:.acetaminophen, ondansetron, ondansetron, senna-docusate, traMADol, [DISCONTINUED] traMADol, [DISCONTINUED] traMADol Assessment/Plan: Mr. Hai is a 51 year old male with a PMHx of hypertension (untreated), significant smoking history, and previous cocaine use who was admitted on 08/05/2012 with symptoms of headache and worsening dysequilibrium. Head imaging demonstrates acute infarct of the left PICA.   1) Left cerebellar stroke. It is unclear at this point if ischemic (2/2 to his risk factors of untreated HTN and ongoing tobacco abuse) or due to embolism. This stroke likely accounts for the patient's presenting symptoms of dysequilibrium, nausea and vertigo. Further evaluation for potential sources of embolism is warranted. MRI and MRA of head shows acute infarct of left PICA and was negative for aneurysm or dissection. Patient passed swallow  screen. Patient was not on aspirin before admission. UDS was negative. TSH was wnl (0.654), HbA1c was 5.5%, lipid profile demonstrated HDL of 38 and LDL of 101. Smoking cessation counseling was provided. Blood pressures elevated on admission. However, will allow for permissive hypertension initially in setting of stroke but he will need outpatient follow up for improved HTN control.  -2D echo done today with EF of 50-55%, no PFO, no signs on embolism.  -Pt will need follow up with Dr. Sethi in 1 month, he will follow up on hypercoagulation, complement, and   additional labs.  -Per Neurology, pt likely needs to be seen by Cardiology but not urgently, his PCP can refer him to a Cardiologist for possible outpatient monitor and assessment of A. Fib.  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 Plan: -Continue telemetry  - Neurology consulted, appreciate recommendations.  - TEE this afternoon, possibly with TCD bubble study - Continue aspirin, statin and heart healthy diet (goal LDL is <100, LDL on 11/25 101) - Resume home Lisinopril 5mg, pt is now >72hours post stroke.  - Continue Tylenol and tramadol PRN for headache.  2) Hyponatremia. Mild hyponatremia today with sodium of 133. With his large left cerebellar stroke described above he certainly is at risk for SIADH.  -Will check urine Na and osmolality.   3) Chest pain - unclear cause. Patient's history of having worsening pain after activity is concerning for cardiac cause of pain, TIMI score 0. Cocaine abuse could further contribute towards pain (although pt states remission at this time). CXR otherwise negative for mediastinal widening, PTX, acute fractures. Troponin has been consistently below 0.30. Patient was encouraged to quit smoking. No new EKG findings. Patient will need Cardiology follow up as outpatient.  -TEE today for PFO evaluation.  -  Per Neurology bubble study to be performed as outpatient (not with TEE 2/2 sedation and poor Valsalva maneuver)  4) Tobacco abuse - patient has ongoing 1.5 ppd tobacco abuse. He was counseled on the adverse health effects of smoking, benefits of smoking cessation, and was urged to pursue smoking cessation.  - Will provide cessation counseling information in discharge instruction.   5) History of polysubstance abuse (including alcohol and crack-cocaine) - patient indicates cessation for several years, however, last UDS in 03/2012 was (+) for cocaine, perhaps less open to sharing experience with his mom in the room. UDS on 11/26 negative for cocaine. No signs of EtOH withdrawal today.  - Placed on CIWA observation (will not add full protocol unless withdrawing).   Dispo: Disposition is deferred at this time, awaiting improvement of current medical problems. Anticipated discharge in approximately today. Per PT, he will need 24 hour supervision upon his discharge and he reports that he will move in with his mother who is able to take care of him if this is needed. He will also need HH PT while he recovers. The patient does not have a current PCP, therefore will not be requiring OPC follow-up after discharge.  The patient does not have transportation limitations that hinder transportation to clinic appointments.  .Services Needed at time of discharge: Y = Yes, Blank = No  PT:  Yes  OT:    RN:    Equipment:    Other:        LOS: 2 days   Mialani Reicks D 08/07/2012, 8:02 AM 

## 2012-08-07 NOTE — Progress Notes (Signed)
INTERNAL MEDICINE TEACHING SERVICE Attending Note  Date: 08/07/2012  Patient name: [name]  Medical record number: [id number]  Date of birth: [birth year]    This patient has been seen and discussed with the house staff. Please see their note for complete details. I concur with their findings with the following additions/corrections: Feels well this morning. He states he is able to walk much better than on admission, but still feels he has some balance problems. Neurology has evaluated him and recommended hypercoag workup as well as vasculitis workup. He will need outpatient follow up with cardiology to obtain a holter monitor. He also needs an agitated saline test with TEE or TTE to r/o PFO. If he has a PFO, he will need bilat LE doppler u/s to r/o DVT as source for CVA.    A/P: 51 yr. Old AAM w/ pmhx significant for HTN, tobacco abuse, polysubstance abuse (including marijuana, crack cocaine, heroin), presented due to dizziness and gait instability, with acute/subacute left PICA territory cerebellar infarct.  1) Acute/subacute left PICA territory cerebellar infarct: ASA 325 mg daily. As above, workup for other causes of clot formation and evaluation for PFO. TEE pending today. 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 Risk reduction education. Statin therapy.  2) HTN: May start HCTZ 12.5 mg daily and norvasc 5 mg. He has a hx of uncontrolled HTN. He is 48-72 hrs post CVA. 3) rest per resident note. Note neuro recommendations. Note PT recommendations. May D/C later today if he has a ride to go home post TEE. Will need neuro follow up and PCP follow up for HTN.   Jonah Blue, DO  08/07/2012, 1:26 PM

## 2012-08-07 NOTE — CV Procedure (Signed)
    Transesophageal Echocardiogram Note  KEONA SHEFFLER [id number] 1961-04-20  Procedure: Transesophageal Echocardiogram Indications: CVA  Procedure Details Consent: Obtained Time Out: Verified patient identification, verified procedure, site/side was marked, verified correct patient position, special equipment/implants available, Radiology Safety Procedures followed,  medications/allergies/relevent history reviewed, required imaging and test results available.  Performed  Medications: Fentanyl: 75 mcg IV Versed: 7 mg  IV  Left Ventrical:  Mild - moderate LV dysfunction.  EF 40%  Mitral Valve: trace MR  Aortic Valve: normal  Tricuspid Valve: normal,    Pulmonic Valve: normal  Left Atrium/ Left atrial appendage: no thrombus,  Atrial septum:  No ASD or PFO  Aorta: normal   Complications: No apparent complications Patient did tolerate procedure well.   Vesta Mixer, Montez Hageman., MD, [facility] 08/07/2012, 2:36 PM

## 2012-08-08 LAB — HOMOCYSTEINE: Homocysteine: 7.5 umol/L (ref 4.0–15.4)

## 2012-08-08 LAB — C4 COMPLEMENT: Complement C4, Body Fluid: 28 mg/dL (ref 10–40)

## 2012-08-08 LAB — C3 COMPLEMENT: C3 Complement: 137 mg/dL (ref 90–180)

## 2012-08-09 LAB — PROTEIN S, TOTAL: Protein S Ag, Total: 147 % (ref 60–150)

## 2012-08-09 LAB — ANA: Anti Nuclear Antibody(ANA): NEGATIVE

## 2012-08-10 LAB — COMPLEMENT, TOTAL: Compl, Total (CH50): 50 U/mL (ref 31–60)

## 2012-08-12 ENCOUNTER — Encounter (HOSPITAL_COMMUNITY): Payer: Self-pay | Admitting: Cardiovascular Disease

## 2012-08-12 LAB — LUPUS ANTICOAGULANT PANEL: Lupus Anticoagulant: NOT DETECTED

## 2012-08-12 LAB — PROTEIN S ACTIVITY: Protein S Activity: 159 % — ABNORMAL HIGH (ref 69–129)

## 2012-08-12 LAB — BETA-2-GLYCOPROTEIN I ABS, IGG/M/A: Beta-2-Glycoprotein I IgA: 18 A Units (ref <20)

## 2012-08-12 LAB — CARDIOLIPIN ANTIBODIES, IGG, IGM, IGA: Anticardiolipin IgM: 7 MPL U/mL — ABNORMAL LOW (ref <11)

## 2012-10-15 ENCOUNTER — Other Ambulatory Visit (HOSPITAL_COMMUNITY): Payer: Self-pay | Admitting: Internal Medicine

## 2012-12-03 ENCOUNTER — Ambulatory Visit: Payer: Self-pay | Admitting: Nurse Practitioner

## 2013-06-09 ENCOUNTER — Telehealth: Payer: Self-pay | Admitting: *Deleted

## 2013-06-09 NOTE — Telephone Encounter (Signed)
Pt calling asking for copy of letter relating to September 18, 2012 for [place] [facility] [place], [employer].   Updated address and phone #. Mailed copy to pt. Placed in mail outgoing tomorrow 06-10-13.

## 2014-02-20 ENCOUNTER — Encounter (HOSPITAL_COMMUNITY): Payer: Self-pay | Admitting: Emergency Medicine

## 2014-02-20 ENCOUNTER — Emergency Department (HOSPITAL_COMMUNITY)
Admission: EM | Admit: 2014-02-20 | Discharge: 2014-02-20 | Disposition: A | Discharge status: Home / Self Care | Payer: Self-pay | Attending: Emergency Medicine | Admitting: Emergency Medicine

## 2014-02-20 DIAGNOSIS — IMO0002 Reserved for concepts with insufficient information to code with codable children: Secondary | ICD-10-CM | POA: Insufficient documentation

## 2014-02-20 DIAGNOSIS — R221 Localized swelling, mass and lump, neck: Principal | ICD-10-CM

## 2014-02-20 DIAGNOSIS — R220 Localized swelling, mass and lump, head: Secondary | ICD-10-CM | POA: Insufficient documentation

## 2014-02-20 DIAGNOSIS — I209 Angina pectoris, unspecified: Secondary | ICD-10-CM | POA: Insufficient documentation

## 2014-02-20 DIAGNOSIS — Z79899 Other long term (current) drug therapy: Secondary | ICD-10-CM | POA: Insufficient documentation

## 2014-02-20 DIAGNOSIS — F17200 Nicotine dependence, unspecified, uncomplicated: Secondary | ICD-10-CM | POA: Insufficient documentation

## 2014-02-20 DIAGNOSIS — Z8673 Personal history of transient ischemic attack (TIA), and cerebral infarction without residual deficits: Secondary | ICD-10-CM | POA: Insufficient documentation

## 2014-02-20 DIAGNOSIS — I10 Essential (primary) hypertension: Secondary | ICD-10-CM | POA: Insufficient documentation

## 2014-02-20 DIAGNOSIS — Z8619 Personal history of other infectious and parasitic diseases: Secondary | ICD-10-CM | POA: Insufficient documentation

## 2014-02-20 MED ORDER — METHYLPREDNISOLONE SODIUM SUCC 125 MG IJ SOLR
125.0000 mg | Freq: Once | INTRAMUSCULAR | Status: AC
  Administered 2014-02-20: 125 mg via INTRAVENOUS
  Filled 2014-02-20: qty 2

## 2014-02-20 MED ORDER — DIPHENHYDRAMINE HCL 50 MG/ML IJ SOLN
25.0000 mg | Freq: Once | INTRAMUSCULAR | Status: AC
  Administered 2014-02-20: 25 mg via INTRAVENOUS
  Filled 2014-02-20: qty 1

## 2014-02-20 MED ORDER — FAMOTIDINE IN NACL 20-0.9 MG/50ML-% IV SOLN
20.0000 mg | Freq: Once | INTRAVENOUS | Status: AC
  Administered 2014-02-20: 20 mg via INTRAVENOUS
  Filled 2014-02-20: qty 50

## 2014-02-20 NOTE — ED Notes (Signed)
Pt ambulated to restroom. Given crackers and ginger ale to drink.

## 2014-02-20 NOTE — ED Notes (Addendum)
Pt states went to [facility] last night for tick bite and rash and pt was given septra, doxyclycline, prednisone and atarax. Pt states also takes lisinopril daily for bp.

## 2014-02-20 NOTE — ED Notes (Signed)
Swelling of lips ,,onset this am. No resp distress.  Taking meds for tick bite and lisinopril

## 2014-02-20 NOTE — ED Notes (Signed)
MD at bedside. 

## 2014-02-20 NOTE — ED Provider Notes (Signed)
CSN: [id number]     Arrival date & time 02/20/14  1431 History  This chart was scribed for Donnetta HutchingBrian Aikam Vinje, MD by Shari HeritageAisha Amuda, ED Scribe. The patient was seen in room APA03/APA03. Patient's care was started at 2:47 PM.  Chief Complaint  Patient presents with  . Oral Swelling    The history is provided by the patient. No language interpreter was used.    HPI Comments: [name] is a 53 y.o. male who presents to the Emergency Department complaining of constant swelling of the lips and mouth onset this morning. Patient also reports that he experienced significant reflux last night and developed a tightening sensation in his throat that has improved today. He recently experienced a tick bite and developed hives, and was [facility]. He started on Septra and doxycycline 6 days ago after [facility]. He was also given Atarax for hives. He takes lisinopril-HCTZ 20-25 mg for blood pressure. He denies shortness of breath or other physical complaints at this time. He has a history of stroke.   Past Medical History  Diagnosis Date  . Hypertension   . Tobacco abuse   . Mood disorder     NOS. [facility]  . History of cocaine abuse   . History of alcohol abuse   . Hypercholesterolemia   . Anginal pain   . Hepatitis B 1991  . Stroke 08/05/2012    denies residual (08/05/2012)   Past Surgical History  Procedure Laterality Date  . Mouth surgery  2011    "had to cut tooth out of my gum" (08/05/2012)  . Tee without cardioversion  08/07/2012    Procedure: TRANSESOPHAGEAL ECHOCARDIOGRAM (TEE);  Surgeon: Vesta MixerPhilip J Nahser, MD;  [facility];  Service: Cardiovascular;  Laterality: N/A;   Family History  Problem Relation Age of Onset  . Hypertension Mother   . Osteoarthritis Mother   . Hyperlipidemia Mother   . Prostate cancer Father   . CAD Maternal Grandmother   . Lung cancer Paternal Grandfather     tobacco abuse   History   Substance Use Topics  . Smoking status: Current Every Day Smoker -- 1.50 packs/day for 35 years    Types: Cigarettes  . Smokeless tobacco: Current User    Types: Snuff  . Alcohol Use: 1.2 oz/week    2 Cans of beer per week     Comment: 1-2 beers a week / previously drank heavily until 2009 6 pack or more of beer daily.    Review of Systems A complete 10 system review of systems was obtained and all systems are negative except as noted in the HPI and PMH.   Allergies  Review of patient's allergies indicates no known allergies.  Home Medications   Prior to Admission medications   Medication Sig Start Date End Date Taking? Authorizing Provider  doxycycline (VIBRA-TABS) 100 MG tablet Take 100 mg by mouth 2 (two) times daily. 10 day course starting on 02/16/2014   Yes Historical Provider, MD  hydrOXYzine (ATARAX/VISTARIL) 10 MG tablet Take 10 mg by mouth every 6 (six) hours as needed for itching.   Yes Historical Provider, MD  lisinopril-hydrochlorothiazide (PRINZIDE,ZESTORETIC) 20-12.5 MG per tablet Take 1 tablet by mouth daily.   Yes Historical Provider, MD  sulfamethoxazole-trimethoprim (BACTRIM DS,SEPTRA DS) 800-160 MG per tablet Take 1 tablet by mouth 2 (two) times daily. 10 day course starting on 02/16/2014   Yes Historical Provider, MD  predniSONE (  DELTASONE) 20 MG tablet Take 40 mg by mouth daily with breakfast. 4 days course starting on 02/16/2014    Historical Provider, MD   Triage Vitals: BP 140/100  Pulse 76  Temp(Src) 99.6 F (37.6 C) (Oral)  Resp 18  Ht 5\' 7"  (1.702 m)  Wt 176 lb (79.833 kg)  BMI 27.56 kg/m2  SpO2 96% Physical Exam  Nursing note and vitals reviewed. Constitutional: He is oriented to person, place, and time. He appears well-developed and well-nourished.  HENT:  Head: Normocephalic and atraumatic.  Mouth/Throat: Oropharynx is clear and moist. No oropharyngeal exudate, posterior oropharyngeal edema or posterior oropharyngeal erythema.  Edematous upper lips.  Airway intact.  Eyes: Conjunctivae and EOM are normal. Pupils are equal, round, and reactive to light.  Neck: Normal range of motion. Neck supple.  Cardiovascular: Normal rate, regular rhythm and normal heart sounds.   Pulmonary/Chest: Effort normal and breath sounds normal. No respiratory distress. He has no wheezes. He has no rales.  Lungs are clear to auscultation.  Abdominal: Soft. Bowel sounds are normal.  Musculoskeletal: Normal range of motion.  Neurological: He is alert and oriented to person, place, and time.  Skin: Skin is warm and dry.  Psychiatric: He has a normal mood and affect. His behavior is normal.    ED Course  Procedures (including critical care time) DIAGNOSTIC STUDIES: Oxygen Saturation is 96% on room air, adequate by my interpretation.    COORDINATION OF CARE: 2:50 PM- Will order the big three. Patient informed of current plan for treatment and evaluation and agrees with plan at this time.     Labs Review Labs Reviewed - No data to display  Imaging Review No results found.   EKG Interpretation None      MDM   Final diagnoses:  Lip swelling    Symptoms could be related to an ACE inhibitor related angioedema.  However patient has also been prescribed 2 new antibiotics recently, Bactrim and Doxycycline. Tic exposure has been minimal. He has been two antibiotics for several days. I will recommend discontinuation of antibiotics and continue of his lisinopril/hydrochlorothiazide. There is a caveat that the Lisinopril may be the culprit also.  I personally performed the services described in this documentation, which was scribed in my presence. The recorded information has been reviewed and is accurate.    Donnetta HutchingBrian Tyriana Helmkamp, MD 02/20/14 [phone]

## 2014-02-20 NOTE — Discharge Instructions (Signed)
Recommend discontinuation of all antibiotics at this time. Continue your blood pressure medication. Be advised that the blood pressure medicine could be the possible culprit also. Return if worse.

## 2014-02-26 ENCOUNTER — Encounter (HOSPITAL_COMMUNITY): Payer: Self-pay | Admitting: Emergency Medicine

## 2014-02-26 ENCOUNTER — Emergency Department (HOSPITAL_COMMUNITY)
Admission: EM | Admit: 2014-02-26 | Discharge: 2014-02-27 | Disposition: A | Discharge status: Home / Self Care | Payer: Self-pay | Attending: Emergency Medicine | Admitting: Emergency Medicine

## 2014-02-26 ENCOUNTER — Emergency Department (HOSPITAL_COMMUNITY): Payer: Self-pay

## 2014-02-26 DIAGNOSIS — I10 Essential (primary) hypertension: Secondary | ICD-10-CM | POA: Insufficient documentation

## 2014-02-26 DIAGNOSIS — R252 Cramp and spasm: Secondary | ICD-10-CM | POA: Insufficient documentation

## 2014-02-26 DIAGNOSIS — Z8619 Personal history of other infectious and parasitic diseases: Secondary | ICD-10-CM | POA: Insufficient documentation

## 2014-02-26 DIAGNOSIS — IMO0002 Reserved for concepts with insufficient information to code with codable children: Secondary | ICD-10-CM | POA: Insufficient documentation

## 2014-02-26 DIAGNOSIS — Z79899 Other long term (current) drug therapy: Secondary | ICD-10-CM | POA: Insufficient documentation

## 2014-02-26 DIAGNOSIS — Z791 Long term (current) use of non-steroidal anti-inflammatories (NSAID): Secondary | ICD-10-CM | POA: Insufficient documentation

## 2014-02-26 DIAGNOSIS — E7800 Pure hypercholesterolemia, unspecified: Secondary | ICD-10-CM | POA: Insufficient documentation

## 2014-02-26 DIAGNOSIS — Z8673 Personal history of transient ischemic attack (TIA), and cerebral infarction without residual deficits: Secondary | ICD-10-CM | POA: Insufficient documentation

## 2014-02-26 DIAGNOSIS — F17200 Nicotine dependence, unspecified, uncomplicated: Secondary | ICD-10-CM | POA: Insufficient documentation

## 2014-02-26 DIAGNOSIS — Z8659 Personal history of other mental and behavioral disorders: Secondary | ICD-10-CM | POA: Insufficient documentation

## 2014-02-26 LAB — CBC
HCT: 41.9 % (ref 39.0–52.0)
HEMOGLOBIN: 15.1 g/dL (ref 13.0–17.0)
MCH: 32.5 pg (ref 26.0–34.0)
MCHC: 36 g/dL (ref 30.0–36.0)
MCV: 90.3 fL (ref 78.0–100.0)
Platelets: 197 10*3/uL (ref 150–400)
RBC: 4.64 MIL/uL (ref 4.22–5.81)
RDW: 13.5 % (ref 11.5–15.5)
WBC: 8.2 10*3/uL (ref 4.0–10.5)

## 2014-02-26 LAB — BASIC METABOLIC PANEL
BUN: 18 mg/dL (ref 6–23)
CALCIUM: 8.8 mg/dL (ref 8.4–10.5)
CO2: 20 mEq/L (ref 19–32)
Chloride: 96 mEq/L (ref 96–112)
Creatinine, Ser: 1.05 mg/dL (ref 0.50–1.35)
GFR calc Af Amer: >90 mL/min (ref >90)
GFR calc non Af Amer: 80 mL/min — ABNORMAL LOW (ref >90)
GLUCOSE: 119 mg/dL — AB (ref 70–99)
POTASSIUM: 4.4 meq/L (ref 3.7–5.3)
Sodium: 131 mEq/L — ABNORMAL LOW (ref 137–147)

## 2014-02-26 LAB — TROPONIN I: Troponin I: <0.3 ng/mL (ref <0.30)

## 2014-02-26 MED ORDER — DIAZEPAM 5 MG PO TABS
5.0000 mg | ORAL_TABLET | Freq: Once | ORAL | Status: AC
  Administered 2014-02-26: 5 mg via ORAL
  Filled 2014-02-26: qty 1

## 2014-02-26 MED ORDER — KETOROLAC TROMETHAMINE 30 MG/ML IJ SOLN
30.0000 mg | Freq: Once | INTRAMUSCULAR | Status: AC
  Administered 2014-02-26: 30 mg via INTRAVENOUS
  Filled 2014-02-26: qty 1

## 2014-02-26 MED ORDER — GI COCKTAIL ~~LOC~~
30.0000 mL | Freq: Once | ORAL | Status: AC
  Administered 2014-02-26: 30 mL via ORAL
  Filled 2014-02-26: qty 30

## 2014-02-26 MED ORDER — OXYCODONE-ACETAMINOPHEN 5-325 MG PO TABS
1.0000 | ORAL_TABLET | Freq: Once | ORAL | Status: AC
  Administered 2014-02-26: 1 via ORAL
  Filled 2014-02-26: qty 1

## 2014-02-26 NOTE — ED Notes (Signed)
Patient states he ate some pork 2 days ago and thinks it may have not been suitable to eat. He states this may be the cause of his pain.

## 2014-02-26 NOTE — ED Notes (Signed)
Patient reports central chest pain that he describes as crushing. States pain started tonight while driving.

## 2014-02-26 NOTE — ED Provider Notes (Signed)
CSN: [id number]     Arrival date & time 02/26/14  2236 History   First MD Initiated Contact with Patient 02/26/14 2304  This chart was scribed for Lyanne CoKevin M Campos, MD by Valera CastleSteven Perry, ED Scribe. This patient was seen in room APA19/APA19 and the patient's care was started at 11:46 PM.    Chief Complaint  Patient presents with  . Chest Pain     The history is provided by the patient. No language interpreter was used.   HPI Comments: [name] is a 53 y.o. male who presents to the Emergency Department complaining of gradually worsening, constant, central, cramping, stabbing waves of central chest pain that radiate into his bilateral arms, onset about 7 hours ago. He reports sometimes his chest pain feels like acid coming up his throat. He reports associated intermittent LE cramping. He denies having tried any medications. He has tried drinking water for relief.  He denies anything out of the ordinary today. He states he woke up about 8:00AM then went over to his girlfriend's house. He denies hard drug use today, including cocaine. He states he smokes marijuana daily, smoked as usual today. He last used cocaine 2 years ago since his stroke in 2013. He denies SOB, and any other associated symptoms. He denies h/o heart problems, but has h/o HTN and takes BP medication. He denies any other medical history.   Past Medical History  Diagnosis Date  . Hypertension   . Tobacco abuse   . Mood disorder     NOS. [facility]  . History of cocaine abuse   . History of alcohol abuse   . Hypercholesterolemia   . Anginal pain   . Hepatitis B 1991  . Stroke 08/05/2012    denies residual (08/05/2012)   Past Surgical History  Procedure Laterality Date  . Mouth surgery  2011    "had to cut tooth out of my gum" (08/05/2012)  . Tee without cardioversion  08/07/2012    Procedure: TRANSESOPHAGEAL ECHOCARDIOGRAM (TEE);  Surgeon: Vesta MixerPhilip J Nahser, MD;  [facility];  Service:  Cardiovascular;  Laterality: N/A;   Family History  Problem Relation Age of Onset  . Hypertension Mother   . Osteoarthritis Mother   . Hyperlipidemia Mother   . Prostate cancer Father   . CAD Maternal Grandmother   . Lung cancer Paternal Grandfather     tobacco abuse   History  Substance Use Topics  . Smoking status: Current Every Day Smoker -- 1.50 packs/day for 35 years    Types: Cigarettes  . Smokeless tobacco: Current User    Types: Snuff  . Alcohol Use: 1.2 oz/week    2 Cans of beer per week     Comment: 1-2 beers a week / previously drank heavily until 2009 6 pack or more of beer daily.    Review of Systems A complete 10 system review of systems was obtained and all systems are negative except as noted in the HPI and PMH.   Allergies  Review of patient's allergies indicates no known allergies.  Home Medications   Prior to Admission medications   Medication Sig Start Date End Date Taking? Authorizing Shamikia Linskey  doxycycline (VIBRA-TABS) 100 MG tablet Take 100 mg by mouth 2 (two) times daily. 10 day course starting on 02/16/2014   Yes Historical Sakura Denis, MD  hydrOXYzine (ATARAX/VISTARIL) 10 MG tablet Take 10 mg by mouth every 6 (six) hours as needed for itching.   Yes Historical  Rubert Frediani, MD  lisinopril-hydrochlorothiazide (PRINZIDE,ZESTORETIC) 20-12.5 MG per tablet Take 1 tablet by mouth daily.   Yes Historical Josephina Melcher, MD  sulfamethoxazole-trimethoprim (BACTRIM DS,SEPTRA DS) 800-160 MG per tablet Take 1 tablet by mouth 2 (two) times daily. 10 day course starting on 02/16/2014   Yes Historical Stacia Feazell, MD  cyclobenzaprine (FLEXERIL) 10 MG tablet Take 1 tablet (10 mg total) by mouth 2 (two) times daily as needed for muscle spasms. 02/27/14   Lyanne Co, MD  naproxen (NAPROSYN) 500 MG tablet Take 1 tablet (500 mg total) by mouth 2 (two) times daily. 02/27/14   Lyanne Co, MD  predniSONE (DELTASONE) 20 MG tablet Take 40 mg by mouth daily with breakfast. 4 days course  starting on 02/16/2014    Historical Treylan Mcclintock, MD   BP 125/79  Pulse 76  Temp(Src) 98.7 F (37.1 C) (Oral)  Resp 18  Ht 5\' 7"  (1.702 m)  Wt 185 lb (83.915 kg)  BMI 28.97 kg/m2  SpO2 97% Physical Exam  Nursing note and vitals reviewed. Constitutional: He is oriented to person, place, and time. He appears well-developed and well-nourished.  HENT:  Head: Normocephalic and atraumatic.  Eyes: Conjunctivae and EOM are normal.  Neck: Normal range of motion.  Cardiovascular: Normal rate, regular rhythm and normal heart sounds.   No murmur heard. Pulmonary/Chest: Effort normal and breath sounds normal. No respiratory distress.  Abdominal: Soft. He exhibits no distension. There is no tenderness. There is no rebound and no guarding.  Musculoskeletal: Normal range of motion.  Neurological: He is alert and oriented to person, place, and time.  Psychiatric: He has a normal mood and affect.   ED Course  Procedures (including critical care time)  DIAGNOSTIC STUDIES: Oxygen Saturation is 97% on room air, normal by my interpretation.    COORDINATION OF CARE: 11:53 PM- Discussed normal EKG results with pt. Discussed treatment plan which includes Valium with pt at bedside and pt agreed to plan.   Medications  gi cocktail (Maalox,Lidocaine,Donnatal) (30 mLs Oral Given 02/26/14 2359)  diazepam (VALIUM) tablet 5 mg (5 mg Oral Given 02/26/14 2359)  oxyCODONE-acetaminophen (PERCOCET/ROXICET) 5-325 MG per tablet 1 tablet (1 tablet Oral Given 02/26/14 2359)  ketorolac (TORADOL) 30 MG/ML injection 30 mg (30 mg Intravenous Given 02/26/14 2359)   Results for orders placed during the hospital encounter of 02/26/14  TROPONIN I      Result Value Ref Range   Troponin I <0.30  <0.30 ng/mL  BASIC METABOLIC PANEL      Result Value Ref Range   Sodium 131 (*) 137 - 147 mEq/L   Potassium 4.4  3.7 - 5.3 mEq/L   Chloride 96  96 - 112 mEq/L   CO2 20  19 - 32 mEq/L   Glucose, Bld 119 (*) 70 - 99 mg/dL   BUN 18  6  - 23 mg/dL   Creatinine, Ser 1.61  0.50 - 1.35 mg/dL   Calcium 8.8  8.4 - 09.6 mg/dL   GFR calc non Af Amer 80 (*) >90 mL/min   GFR calc Af Amer >90  >90 mL/min  CBC      Result Value Ref Range   WBC 8.2  4.0 - 10.5 K/uL   RBC 4.64  4.22 - 5.81 MIL/uL   Hemoglobin 15.1  13.0 - 17.0 g/dL   HCT 04.5  40.9 - 81.1 %   MCV 90.3  78.0 - 100.0 fL   MCH 32.5  26.0 - 34.0 pg   MCHC 36.0  30.0 -  36.0 g/dL   RDW 16.113.5  09.611.5 - 04.515.5 %   Platelets 197  150 - 400 K/uL   Dg Chest Port 1 View  02/26/2014   CLINICAL DATA:  Chest pain, radiating to the left upper extremity. History of smoking.  EXAM: PORTABLE CHEST - 1 VIEW  COMPARISON:  Chest radiograph performed 08/05/2012  FINDINGS: The lungs are well-aerated and clear. There is no evidence of focal opacification, pleural effusion or pneumothorax.  The cardiomediastinal silhouette is within normal limits. No acute osseous abnormalities are seen.  IMPRESSION: No acute cardiopulmonary process seen.   Electronically Signed   By: Roanna RaiderJeffery  Chang M.D.   On: 02/26/2014 23:55     EKG Interpretation   Date/Time:  Thursday February 26 2014 22:42:15 EDT Ventricular Rate:  78 PR Interval:  162 QRS Duration: 94 QT Interval:  362 QTC Calculation: 412 R Axis:   63 Text Interpretation:  Sinus rhythm ST elev, probable normal early repol  pattern No significant change was found Confirmed by CAMPOS  MD, KEVIN  ([id number]) on 02/26/2014 11:06:03 PM      MDM   Final diagnoses:  Cramps, muscle, general    Appears represent some type of full-body muscle cramps.  Doubt ACS.  Improved after Percocet and Valium.  Chest and abdomen benign.  Workup negative in the ER.   I personally performed the services described in this documentation, which was scribed in my presence. The recorded information has been reviewed and is accurate.      Lyanne CoKevin M Campos, MD 02/27/14 [phone]

## 2014-02-27 MED ORDER — NAPROXEN 500 MG PO TABS: 500.0000 mg | ORAL_TABLET | Freq: Two times a day (BID) | ORAL | Status: DC

## 2014-02-27 MED ORDER — CYCLOBENZAPRINE HCL 10 MG PO TABS: 10.0000 mg | ORAL_TABLET | Freq: Two times a day (BID) | ORAL | Status: DC | PRN

## 2014-02-27 NOTE — Discharge Instructions (Signed)

## 2014-04-01 ENCOUNTER — Emergency Department (HOSPITAL_COMMUNITY)
Admission: EM | Admit: 2014-04-01 | Discharge: 2014-04-01 | Disposition: A | Discharge status: Home / Self Care | Payer: Self-pay | Attending: Emergency Medicine | Admitting: Emergency Medicine

## 2014-04-01 ENCOUNTER — Encounter (HOSPITAL_COMMUNITY): Payer: Self-pay | Admitting: Emergency Medicine

## 2014-04-01 DIAGNOSIS — Y9389 Activity, other specified: Secondary | ICD-10-CM | POA: Insufficient documentation

## 2014-04-01 DIAGNOSIS — Y929 Unspecified place or not applicable: Secondary | ICD-10-CM | POA: Insufficient documentation

## 2014-04-01 DIAGNOSIS — Z8619 Personal history of other infectious and parasitic diseases: Secondary | ICD-10-CM | POA: Insufficient documentation

## 2014-04-01 DIAGNOSIS — Z79899 Other long term (current) drug therapy: Secondary | ICD-10-CM | POA: Insufficient documentation

## 2014-04-01 DIAGNOSIS — I10 Essential (primary) hypertension: Secondary | ICD-10-CM | POA: Insufficient documentation

## 2014-04-01 DIAGNOSIS — Z792 Long term (current) use of antibiotics: Secondary | ICD-10-CM | POA: Insufficient documentation

## 2014-04-01 DIAGNOSIS — F17200 Nicotine dependence, unspecified, uncomplicated: Secondary | ICD-10-CM | POA: Insufficient documentation

## 2014-04-01 DIAGNOSIS — Z8673 Personal history of transient ischemic attack (TIA), and cerebral infarction without residual deficits: Secondary | ICD-10-CM | POA: Insufficient documentation

## 2014-04-01 DIAGNOSIS — T628X1A Toxic effect of other specified noxious substances eaten as food, accidental (unintentional), initial encounter: Secondary | ICD-10-CM | POA: Insufficient documentation

## 2014-04-01 DIAGNOSIS — E7800 Pure hypercholesterolemia, unspecified: Secondary | ICD-10-CM | POA: Insufficient documentation

## 2014-04-01 DIAGNOSIS — Z791 Long term (current) use of non-steroidal anti-inflammatories (NSAID): Secondary | ICD-10-CM | POA: Insufficient documentation

## 2014-04-01 DIAGNOSIS — T783XXA Angioneurotic edema, initial encounter: Secondary | ICD-10-CM | POA: Insufficient documentation

## 2014-04-01 DIAGNOSIS — IMO0002 Reserved for concepts with insufficient information to code with codable children: Secondary | ICD-10-CM | POA: Insufficient documentation

## 2014-04-01 MED ORDER — DIPHENHYDRAMINE HCL 25 MG PO TABS: 25.0000 mg | ORAL_TABLET | Freq: Four times a day (QID) | ORAL | Status: DC | PRN

## 2014-04-01 MED ORDER — FAMOTIDINE IN NACL 20-0.9 MG/50ML-% IV SOLN
20.0000 mg | Freq: Once | INTRAVENOUS | Status: AC
  Administered 2014-04-01: 20 mg via INTRAVENOUS
  Filled 2014-04-01: qty 50

## 2014-04-01 MED ORDER — DIPHENHYDRAMINE HCL 50 MG/ML IJ SOLN
25.0000 mg | Freq: Once | INTRAMUSCULAR | Status: AC
  Administered 2014-04-01: 25 mg via INTRAVENOUS
  Filled 2014-04-01: qty 1

## 2014-04-01 MED ORDER — FAMOTIDINE 20 MG PO TABS: 20.0000 mg | ORAL_TABLET | Freq: Two times a day (BID) | ORAL | Status: DC

## 2014-04-01 MED ORDER — METHYLPREDNISOLONE SODIUM SUCC 125 MG IJ SOLR
125.0000 mg | Freq: Once | INTRAMUSCULAR | Status: AC
  Administered 2014-04-01: 125 mg via INTRAVENOUS
  Filled 2014-04-01: qty 2

## 2014-04-01 MED ORDER — PREDNISONE 20 MG PO TABS: 40.0000 mg | ORAL_TABLET | Freq: Every day | ORAL | Status: DC

## 2014-04-01 MED ORDER — HYDROCHLOROTHIAZIDE 25 MG PO TABS: 25.0000 mg | ORAL_TABLET | Freq: Every day | ORAL | Status: DC

## 2014-04-01 NOTE — ED Provider Notes (Signed)
CSN: [id number]     Arrival date & time 04/01/14  0401 History   First MD Initiated Contact with Patient 04/01/14 [phone]     Chief Complaint  Patient presents with  . 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  Swelling is constant, severe and nothing makes better or worse - no difficulty spaeking or swallowing and no SOB, CP, Abd pain or other c/o.  STarted meds 7 months ago.  The history is provided by the patient and medical records.    Past Medical History  Diagnosis Date  . Hypertension   . Tobacco abuse   . Mood disorder     NOS. [facility]  . History of cocaine abuse   . History of alcohol abuse   . Hypercholesterolemia   . Anginal pain   . Hepatitis B 1991  . Stroke 08/05/2012    denies residual (08/05/2012)   Past Surgical History  Procedure Laterality Date  . Mouth surgery  2011    "had to cut tooth out of my gum" (08/05/2012)  . Tee without cardioversion  08/07/2012    Procedure: TRANSESOPHAGEAL ECHOCARDIOGRAM (TEE);  Surgeon: Vesta MixerPhilip J Nahser, MD;  [facility];  Service: Cardiovascular;  Laterality: N/A;   Family History  Problem Relation Age of Onset  . Hypertension Mother   . Osteoarthritis Mother   . Hyperlipidemia Mother   . Prostate cancer Father   . CAD Maternal Grandmother   . Lung cancer Paternal Grandfather     tobacco abuse   History  Substance Use Topics  . Smoking status: Current Every Day Smoker -- 1.50 packs/day for 35 years    Types: Cigarettes  . Smokeless tobacco: Current User    Types: Snuff  . Alcohol Use: 1.2 oz/week    2 Cans of beer per week     Comment: 1-2 beers a week / previously drank heavily until 2009 6 pack or more of beer daily.    Review of Systems  All other systems reviewed and are  negative.     Allergies  Lisinopril  Home Medications   Prior to Admission medications   Medication Sig Start Date End Date Taking? Authorizing Provider  cyclobenzaprine (FLEXERIL) 10 MG tablet Take 1 tablet (10 mg total) by mouth 2 (two) times daily as needed for muscle spasms. 02/27/14   Lyanne CoKevin M Campos, MD  diphenhydrAMINE (BENADRYL) 25 MG tablet Take 1 tablet (25 mg total) by mouth every 6 (six) hours as needed for itching (Rash). 04/01/14   Vida RollerBrian D Vishruth Seoane, MD  doxycycline (VIBRA-TABS) 100 MG tablet Take 100 mg by mouth 2 (two) times daily. 10 day course starting on 02/16/2014    Historical Provider, MD  famotidine (PEPCID) 20 MG tablet Take 1 tablet (20 mg total) by mouth 2 (two) times daily. 04/01/14   Vida RollerBrian D Caroline Longie, MD  hydrochlorothiazide (HYDRODIURIL) 25 MG tablet Take 1 tablet (25 mg total) by mouth daily. 04/01/14   Vida RollerBrian D Nikelle Malatesta, MD  hydrOXYzine (ATARAX/VISTARIL) 10 MG tablet Take 10 mg by mouth every 6 (six) hours as needed for itching.    Historical Provider, MD  naproxen (NAPROSYN) 500 MG tablet Take 1 tablet (500 mg total) by mouth 2 (two) times daily. 02/27/14   Lyanne CoKevin M Campos, MD  predniSONE (DELTASONE) 20 MG tablet Take 40 mg by mouth  daily with breakfast. 4 days course starting on 02/16/2014    Historical Provider, MD  predniSONE (DELTASONE) 20 MG tablet Take 2 tablets (40 mg total) by mouth daily. 04/01/14   Vida Roller, MD  sulfamethoxazole-trimethoprim (BACTRIM DS,SEPTRA DS) 800-160 MG per tablet Take 1 tablet by mouth 2 (two) times daily. 10 day course starting on 02/16/2014    Historical Provider, MD   BP 127/84  Pulse 57  Resp 15  Ht 5\' 10"  (1.778 m)  Wt 185 lb (83.915 kg)  BMI 26.54 kg/m2  SpO2 95% Physical Exam  Nursing note and vitals reviewed. Constitutional: He appears well-developed and well-nourished. No distress.  HENT:  Head: Normocephalic and atraumatic.  Mouth/Throat: Oropharynx is clear and moist. No oropharyngeal exudate.  Upper and lower lips with  moderate to severe swelling - posterior OP clearly and easily visualized with no difficulty, speech normal, tongue not swollen.  Eyes: Conjunctivae and EOM are normal. Pupils are equal, round, and reactive to light. Right eye exhibits no discharge. Left eye exhibits no discharge. No scleral icterus.  Neck: Normal range of motion. Neck supple. No JVD present. No thyromegaly present.  Cardiovascular: Normal rate, regular rhythm, normal heart sounds and intact distal pulses.  Exam reveals no gallop and no friction rub.   No murmur heard. Pulmonary/Chest: Effort normal and breath sounds normal. No respiratory distress. He has no wheezes. He has no rales.  Abdominal: Soft. Bowel sounds are normal. He exhibits no distension and no mass. There is no tenderness.  Musculoskeletal: Normal range of motion. He exhibits no edema and no tenderness.  Lymphadenopathy:    He has no cervical adenopathy.  Neurological: He is alert. Coordination normal.  Skin: Skin is warm and dry. No rash noted. No erythema.  Psychiatric: He has a normal mood and affect. His behavior is normal.    ED Course  Procedures (including critical care time) Labs Review Labs Reviewed - No data to display  Imaging Review No results found.    MDM   Final diagnoses:  Angioedema of lips, initial encounter    Pt has acute onset angioedema - VS with minimal Htn, meds, observation - this presentation suggests clear ACE allergy - will recommend d/c meds immediately - pt informed and in agreement.  Pt watched for > 3 hours - no sig progression, no involvement of the OP / tongue or throat - stable for d/c.  Meds given in ED:  Medications  methylPREDNISolone sodium succinate (SOLU-MEDROL) 125 mg/2 mL injection 125 mg (125 mg Intravenous Given 04/01/14 0429)  famotidine (PEPCID) IVPB 20 mg (0 mg Intravenous Stopped 04/01/14 0505)  diphenhydrAMINE (BENADRYL) injection 25 mg (25 mg Intravenous Given 04/01/14 0429)    Discharge  Medication List as of 04/01/2014  7:20 AM    START taking these medications   Details  diphenhydrAMINE (BENADRYL) 25 MG tablet Take 1 tablet (25 mg total) by mouth every 6 (six) hours as needed for itching (Rash)., Starting 04/01/2014, Until Discontinued, Print    famotidine (PEPCID) 20 MG tablet Take 1 tablet (20 mg total) by mouth 2 (two) times daily., Starting 04/01/2014, Until Discontinued, Print    hydrochlorothiazide (HYDRODIURIL) 25 MG tablet Take 1 tablet (25 mg total) by mouth daily., Starting 04/01/2014, Until Discontinued, Print    !! predniSONE (DELTASONE) 20 MG tablet Take 2 tablets (40 mg total) by mouth daily., Starting 04/01/2014, Until Discontinued, Print     !! - Potential duplicate medications found. Please discuss with provider.  Vida Roller, MD 04/02/14 [phone]

## 2014-04-01 NOTE — Discharge Instructions (Signed)
Stop Lisinopril immediately, this is likely causing your symptoms.  You MUST see your doctor this week for a recheck of your blood pressure.  Take the medicines as below  Benadryl every 6 hours Prednisone once a day Pepcid twice daily.  If you develop any tongue swelling or difficulty breathing or swallowing return to the ER immediately  This swelling may last for several days  [facility] List    Kari BaarsEdward Hawkins MD. Specialty: Pulmonary Disease Contact information: [address]  [address]  [place] [address]  [phone]   Syliva OvermanMargaret Simpson, MD. Specialty: [facility] Contact information: [address]  [place] [address]  [phone]   Lilyan PuntScott Luking, MD. Specialty: [facility] Contact information: [address]  [address]  [place] [address]  [phone]   Avon Gullyesfaye Fanta, MD Specialty: Internal Medicine Contact information: [address]  [place] [address]  [phone]   Catalina PizzaZach Hall, MD. Specialty: Internal Medicine Contact information: [address]  [place] [address]  [phone]   Butch PennyAngus Mcinnis, MD. Specialty: Family Medicine Contact information: [address]  [place] [address]  [phone]   John GiovanniStephen Knowlton, MD. Specialty: [facility] Contact information: [address]  [address]  [place] [address]  [phone]   Carylon Perchesoy Fagan, MD. Specialty: Internal Medicine Contact information: [address]  [address]  [place] [address]  [phone]

## 2014-04-01 NOTE — ED Notes (Signed)
Patient complaining of oral swelling that started at 2000 tonight. Noted swelling to lips and patient speech is muffled. Denies shortness of breath.

## 2014-04-01 NOTE — ED Notes (Signed)
Pt was given a soft drink and graham crackers for consumption, was able to chew and swallow w/o any difficulty, pt having trouble keeping mouth close d/t swelling of lips.

## 2015-04-05 ENCOUNTER — Emergency Department (HOSPITAL_COMMUNITY)
Admission: EM | Admit: 2015-04-05 | Discharge: 2015-04-05 | Disposition: A | Discharge status: Home / Self Care | Payer: Self-pay | Attending: Emergency Medicine | Admitting: Emergency Medicine

## 2015-04-05 ENCOUNTER — Encounter (HOSPITAL_COMMUNITY): Payer: Self-pay | Admitting: *Deleted

## 2015-04-05 DIAGNOSIS — Z8679 Personal history of other diseases of the circulatory system: Secondary | ICD-10-CM | POA: Insufficient documentation

## 2015-04-05 DIAGNOSIS — I10 Essential (primary) hypertension: Secondary | ICD-10-CM | POA: Insufficient documentation

## 2015-04-05 DIAGNOSIS — K047 Periapical abscess without sinus: Secondary | ICD-10-CM | POA: Insufficient documentation

## 2015-04-05 DIAGNOSIS — Z8619 Personal history of other infectious and parasitic diseases: Secondary | ICD-10-CM | POA: Insufficient documentation

## 2015-04-05 DIAGNOSIS — Z8673 Personal history of transient ischemic attack (TIA), and cerebral infarction without residual deficits: Secondary | ICD-10-CM | POA: Insufficient documentation

## 2015-04-05 DIAGNOSIS — Z720 Tobacco use: Secondary | ICD-10-CM | POA: Insufficient documentation

## 2015-04-05 DIAGNOSIS — Z8659 Personal history of other mental and behavioral disorders: Secondary | ICD-10-CM | POA: Insufficient documentation

## 2015-04-05 DIAGNOSIS — Z8739 Personal history of other diseases of the musculoskeletal system and connective tissue: Secondary | ICD-10-CM | POA: Insufficient documentation

## 2015-04-05 MED ORDER — TRAMADOL HCL 50 MG PO TABS: 50.0000 mg | ORAL_TABLET | Freq: Four times a day (QID) | ORAL | Status: DC | PRN

## 2015-04-05 MED ORDER — AMOXICILLIN 500 MG PO CAPS: 500.0000 mg | ORAL_CAPSULE | Freq: Three times a day (TID) | ORAL | Status: DC

## 2015-04-05 NOTE — Discharge Instructions (Signed)

## 2015-04-05 NOTE — ED Provider Notes (Signed)
CSN: [id number]     Arrival date & time 04/05/15  1056 History   First MD Initiated Contact with Patient 04/05/15 1204     Chief Complaint  Patient presents with  . Facial Swelling     (Consider location/radiation/quality/duration/timing/severity/associated sxs/prior Treatment) Patient is a 54 y.o. male presenting with tooth pain. The history is provided by the patient. No language interpreter was used.  Dental Pain Location:  Lower and upper Lower teeth location:  31/RL 2nd molar Quality:  Aching Severity:  Moderate Onset quality:  Gradual Duration:  4 days Timing:  Constant Progression:  Worsening Context: abscess   Previous work-up:  Dental exam Relieved by:  Nothing Worsened by:  Nothing tried Associated symptoms: difficulty swallowing and drooling   Risk factors: no alcohol problem     Past Medical History  Diagnosis Date  . Hypertension   . Tobacco abuse   . Mood disorder     NOS. Admitted to [facility] for detox in 12/2005  . History of cocaine abuse   . History of alcohol abuse   . Hypercholesterolemia   . Anginal pain   . Hepatitis B 1991  . Stroke 08/05/2012    denies residual (08/05/2012)   Past Surgical History  Procedure Laterality Date  . Mouth surgery  2011    "had to cut tooth out of my gum" (08/05/2012)  . Tee without cardioversion  08/07/2012    Procedure: TRANSESOPHAGEAL ECHOCARDIOGRAM (TEE);  Surgeon: Vesta Mixer, MD;  Location: [facility] ENDOSCOPY;  Service: Cardiovascular;  Laterality: N/A;   Family History  Problem Relation Age of Onset  . Hypertension Mother   . Osteoarthritis Mother   . Hyperlipidemia Mother   . Prostate cancer Father   . CAD Maternal Grandmother   . Lung cancer Paternal Grandfather     tobacco abuse   History  Substance Use Topics  . Smoking status: Current Every Day Smoker -- 1.50 packs/day for 35 years    Types: Cigarettes  . Smokeless tobacco: Current User    Types: Snuff  . Alcohol Use: 1.2 oz/week    2 Cans of beer  per week     Comment: 1-2 beers a week / previously drank heavily until 2009 6 pack or more of beer daily.    Review of Systems  HENT: Positive for drooling.   All other systems reviewed and are negative.     Allergies  Lisinopril  Home Medications   Prior to Admission medications   Medication Sig Start Date End Date Taking? Authorizing Provider  ibuprofen (ADVIL,MOTRIN) 200 MG tablet Take 400 mg by mouth every 6 (six) hours as needed for mild pain.   Yes Historical Provider, MD  cyclobenzaprine (FLEXERIL) 10 MG tablet Take 1 tablet (10 mg total) by mouth 2 (two) times daily as needed for muscle spasms. Patient not taking: Reported on 04/05/2015 02/27/14   Azalia Bilis, MD  diphenhydrAMINE (BENADRYL) 25 MG tablet Take 1 tablet (25 mg total) by mouth every 6 (six) hours as needed for itching (Rash). Patient not taking: Reported on 04/05/2015 04/01/14   Eber Hong, MD  famotidine (PEPCID) 20 MG tablet Take 1 tablet (20 mg total) by mouth 2 (two) times daily. Patient not taking: Reported on 04/05/2015 04/01/14   Eber Hong, MD  hydrochlorothiazide (HYDRODIURIL) 25 MG tablet Take 1 tablet (25 mg total) by mouth daily. Patient not taking: Reported on 04/05/2015 04/01/14   Eber Hong, MD  naproxen (NAPROSYN) 500 MG tablet Take 1 tablet (500 mg total)  by mouth 2 (two) times daily. Patient not taking: Reported on 04/05/2015 02/27/14   Azalia Bilis, MD  predniSONE (DELTASONE) 20 MG tablet Take 2 tablets (40 mg total) by mouth daily. Patient not taking: Reported on 04/05/2015 04/01/14   Eber Hong, MD   BP 171/93 mmHg  Pulse 61  Temp(Src) 98 F (36.7 C) (Oral)  Resp 16  Ht  (1.702 m)  Wt 185 lb (83.915 kg)  BMI 28.97 kg/m2  SpO2 100% Physical Exam  Constitutional: He appears well-developed and well-nourished.  HENT:  Head: Normocephalic.  Swollen face, swelling gum line.  No obvious abscess  Eyes: Pupils are equal, round, and reactive to light.  Neck: Normal range of motion.   Cardiovascular: Normal rate.   Pulmonary/Chest: Effort normal.  Neurological: He is alert.  Skin: Skin is warm.    ED Course  Procedures (including critical care time) Labs Review Labs Reviewed - No data to display  Imaging Review No results found.   EKG Interpretation None      MDM   Final diagnoses:  Dental abscess    amoxicillian Tramadol Dental referrals for [place] [facility]    Elson Areas, PA-C 04/05/15 1221  Bethann Berkshire, MD 04/05/15 [phone]

## 2015-04-05 NOTE — ED Notes (Signed)
Pt with right side facial pain and swelling, hx of same and pt states he was not able to afford to have tooth pulled by dentist at the time.  Pain began 4 days ago

## 2015-11-12 ENCOUNTER — Emergency Department (HOSPITAL_COMMUNITY): Payer: BLUE CROSS/BLUE SHIELD

## 2015-11-12 ENCOUNTER — Encounter (HOSPITAL_COMMUNITY): Payer: Self-pay | Admitting: Emergency Medicine

## 2015-11-12 ENCOUNTER — Emergency Department (HOSPITAL_COMMUNITY)
Admission: EM | Admit: 2015-11-12 | Discharge: 2015-11-12 | Disposition: A | Discharge status: Home / Self Care | Payer: BLUE CROSS/BLUE SHIELD | Attending: Emergency Medicine | Admitting: Emergency Medicine

## 2015-11-12 DIAGNOSIS — I10 Essential (primary) hypertension: Secondary | ICD-10-CM

## 2015-11-12 DIAGNOSIS — Z8639 Personal history of other endocrine, nutritional and metabolic disease: Secondary | ICD-10-CM | POA: Diagnosis not present

## 2015-11-12 DIAGNOSIS — F1210 Cannabis abuse, uncomplicated: Secondary | ICD-10-CM | POA: Insufficient documentation

## 2015-11-12 DIAGNOSIS — Z8673 Personal history of transient ischemic attack (TIA), and cerebral infarction without residual deficits: Secondary | ICD-10-CM | POA: Insufficient documentation

## 2015-11-12 DIAGNOSIS — F17210 Nicotine dependence, cigarettes, uncomplicated: Secondary | ICD-10-CM | POA: Diagnosis not present

## 2015-11-12 DIAGNOSIS — Z8619 Personal history of other infectious and parasitic diseases: Secondary | ICD-10-CM | POA: Diagnosis not present

## 2015-11-12 DIAGNOSIS — Z792 Long term (current) use of antibiotics: Secondary | ICD-10-CM | POA: Diagnosis not present

## 2015-11-12 DIAGNOSIS — I209 Angina pectoris, unspecified: Secondary | ICD-10-CM | POA: Diagnosis not present

## 2015-11-12 DIAGNOSIS — R110 Nausea: Secondary | ICD-10-CM | POA: Insufficient documentation

## 2015-11-12 DIAGNOSIS — R51 Headache: Secondary | ICD-10-CM

## 2015-11-12 DIAGNOSIS — H538 Other visual disturbances: Secondary | ICD-10-CM | POA: Insufficient documentation

## 2015-11-12 DIAGNOSIS — R200 Anesthesia of skin: Secondary | ICD-10-CM | POA: Insufficient documentation

## 2015-11-12 DIAGNOSIS — R519 Headache, unspecified: Secondary | ICD-10-CM

## 2015-11-12 LAB — DIFFERENTIAL
BASOS ABS: 0 10*3/uL (ref 0.0–0.1)
BASOS PCT: 0 %
EOS ABS: 0.1 10*3/uL (ref 0.0–0.7)
Eosinophils Relative: 1 %
Lymphocytes Relative: 37 %
Lymphs Abs: 3.1 10*3/uL (ref 0.7–4.0)
Monocytes Absolute: 0.5 10*3/uL (ref 0.1–1.0)
Monocytes Relative: 6 %
NEUTROS PCT: 56 %
Neutro Abs: 4.6 10*3/uL (ref 1.7–7.7)

## 2015-11-12 LAB — COMPREHENSIVE METABOLIC PANEL
ALBUMIN: 4.7 g/dL (ref 3.5–5.0)
ALT: 21 U/L (ref 17–63)
ANION GAP: 8 (ref 5–15)
AST: 28 U/L (ref 15–41)
Alkaline Phosphatase: 58 U/L (ref 38–126)
BUN: 9 mg/dL (ref 6–20)
CHLORIDE: 100 mmol/L — AB (ref 101–111)
CO2: 27 mmol/L (ref 22–32)
Calcium: 9.2 mg/dL (ref 8.9–10.3)
Creatinine, Ser: 1.03 mg/dL (ref 0.61–1.24)
GFR calc Af Amer: >60 mL/min (ref >60)
GFR calc non Af Amer: >60 mL/min (ref >60)
GLUCOSE: 89 mg/dL (ref 65–99)
POTASSIUM: 3.7 mmol/L (ref 3.5–5.1)
Sodium: 135 mmol/L (ref 135–145)
Total Bilirubin: 0.7 mg/dL (ref 0.3–1.2)
Total Protein: 8.3 g/dL — ABNORMAL HIGH (ref 6.5–8.1)

## 2015-11-12 LAB — I-STAT CHEM 8, ED
BUN: 8 mg/dL (ref 6–20)
CALCIUM ION: 1.18 mmol/L (ref 1.12–1.23)
CHLORIDE: 97 mmol/L — AB (ref 101–111)
CREATININE: 1 mg/dL (ref 0.61–1.24)
GLUCOSE: 87 mg/dL (ref 65–99)
HCT: 46 % (ref 39.0–52.0)
Hemoglobin: 15.6 g/dL (ref 13.0–17.0)
Potassium: 3.7 mmol/L (ref 3.5–5.1)
Sodium: 138 mmol/L (ref 135–145)
TCO2: 30 mmol/L (ref 0–100)

## 2015-11-12 LAB — URINALYSIS, ROUTINE W REFLEX MICROSCOPIC
BILIRUBIN URINE: NEGATIVE
Glucose, UA: NEGATIVE mg/dL
HGB URINE DIPSTICK: NEGATIVE
KETONES UR: NEGATIVE mg/dL
Leukocytes, UA: NEGATIVE
NITRITE: NEGATIVE
PH: 6.5 (ref 5.0–8.0)
Protein, ur: NEGATIVE mg/dL
Specific Gravity, Urine: 1.015 (ref 1.005–1.030)

## 2015-11-12 LAB — RAPID URINE DRUG SCREEN, HOSP PERFORMED
AMPHETAMINES: NOT DETECTED
BENZODIAZEPINES: NOT DETECTED
Barbiturates: NOT DETECTED
Cocaine: NOT DETECTED
OPIATES: NOT DETECTED
TETRAHYDROCANNABINOL: POSITIVE — AB

## 2015-11-12 LAB — CBC
HCT: 41.4 % (ref 39.0–52.0)
Hemoglobin: 14.5 g/dL (ref 13.0–17.0)
MCH: 31.9 pg (ref 26.0–34.0)
MCHC: 35 g/dL (ref 30.0–36.0)
MCV: 91.2 fL (ref 78.0–100.0)
PLATELETS: 219 10*3/uL (ref 150–400)
RBC: 4.54 MIL/uL (ref 4.22–5.81)
RDW: 13.5 % (ref 11.5–15.5)
WBC: 8.3 10*3/uL (ref 4.0–10.5)

## 2015-11-12 LAB — ETHANOL

## 2015-11-12 LAB — APTT: APTT: 34 s (ref 24–37)

## 2015-11-12 LAB — PROTIME-INR
INR: 1.13 (ref 0.00–1.49)
PROTHROMBIN TIME: 14.6 s (ref 11.6–15.2)

## 2015-11-12 LAB — CBG MONITORING, ED: Glucose-Capillary: 87 mg/dL (ref 65–99)

## 2015-11-12 IMAGING — CT CT HEAD W/O CM
1 series · 16 of 30 positions shown, 20 images · non-contrast
Comparison: [DATE] head CT.

CLINICAL DATA: Headaches since noon. Blurred vision, dizziness and
nausea.

EXAM:
CT HEAD WITHOUT CONTRAST
TECHNIQUE: Contiguous axial images were obtained from the base of the skull
through the vertex without intravenous contrast.

[Series 2: headseq 4.8 h37s · axial · 0.52mm/px · z∈[+1177,+1337]mm · 16 of 36 slices shown, 20 images]
[im 2/36  brain]
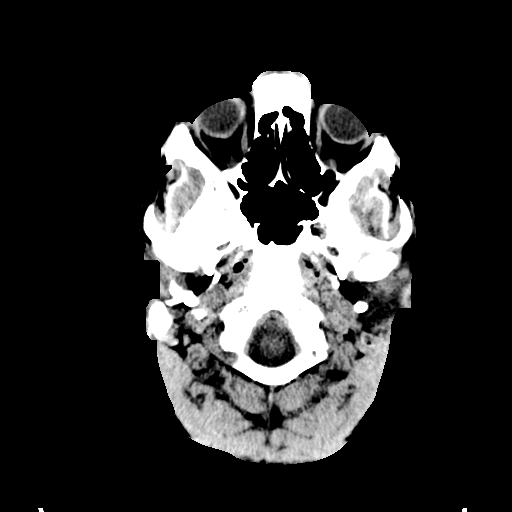
[im 2/36  bone]
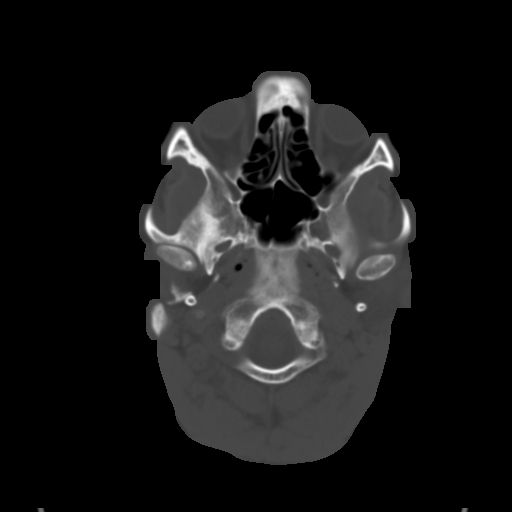
[im 4/36  brain]
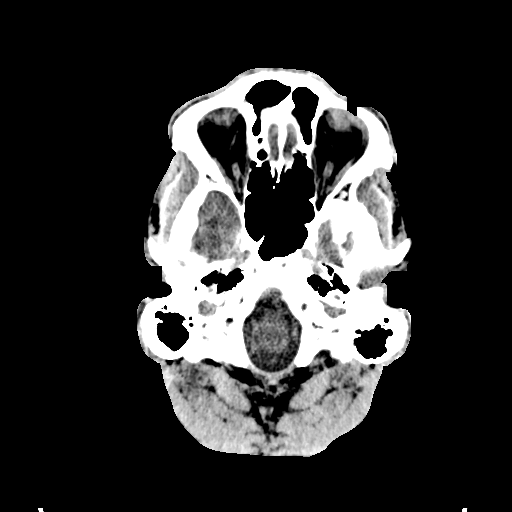
[im 7/36  brain]
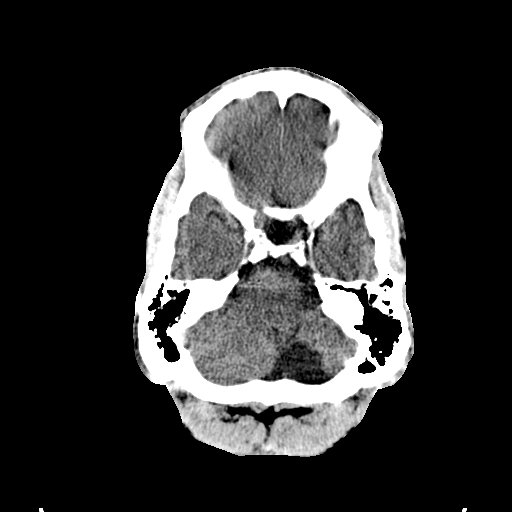
[im 9/36  brain]
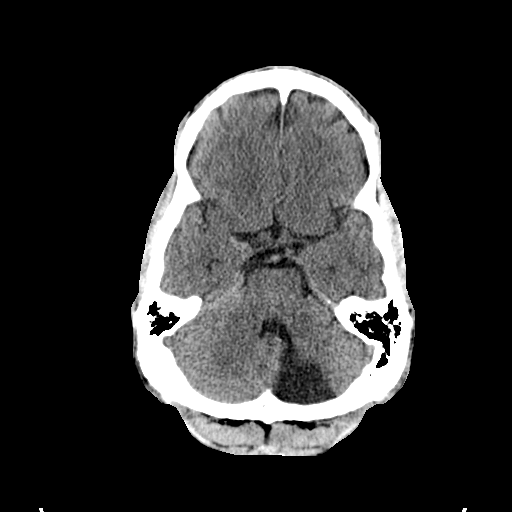
[im 10/36  brain]
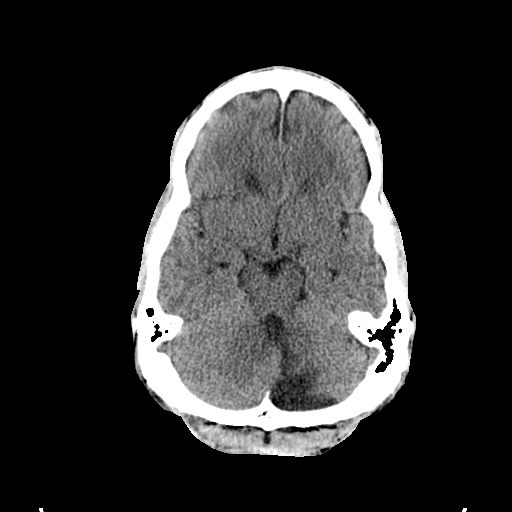
[im 10/36  bone]
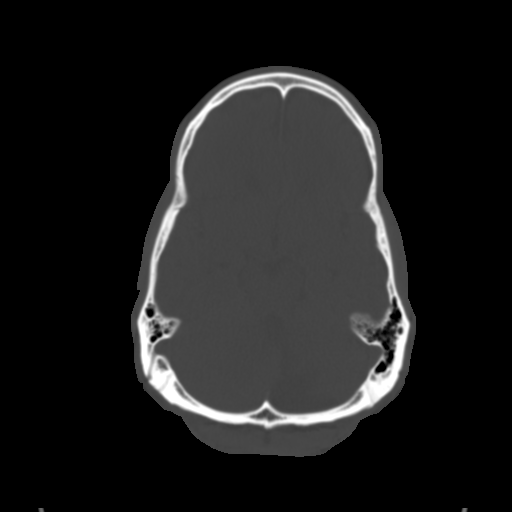
[im 13/36  brain]
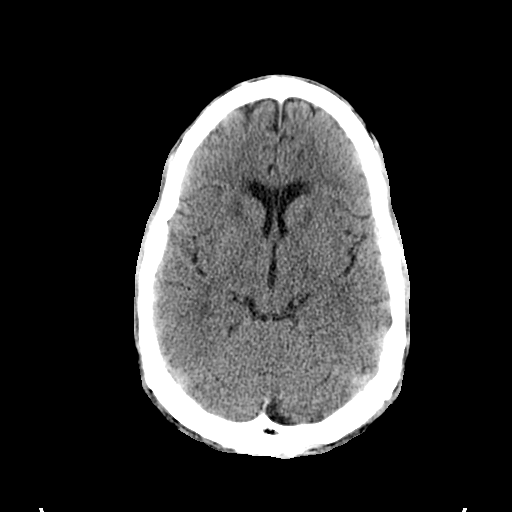
[im 15/36  brain]
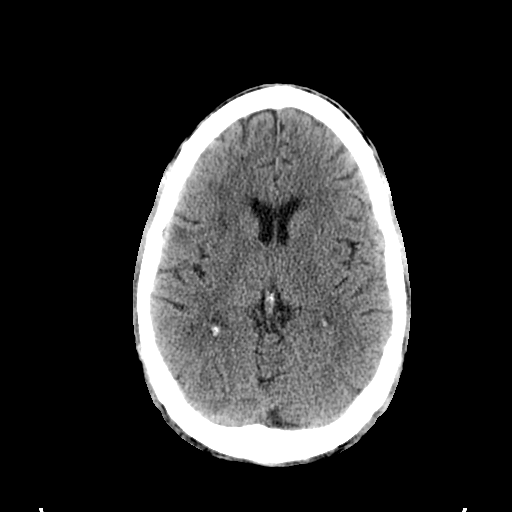
[im 17/36  brain]
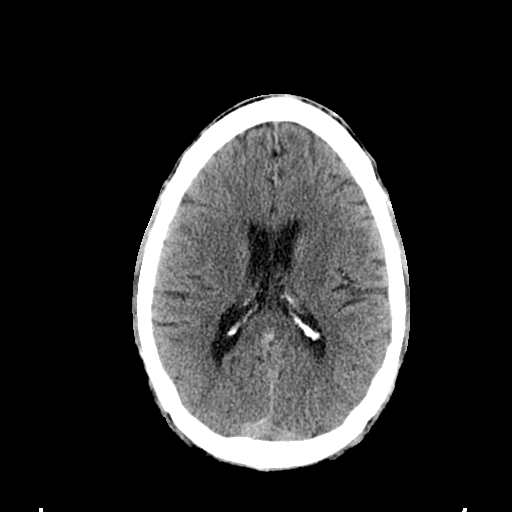
[im 19/36  brain]
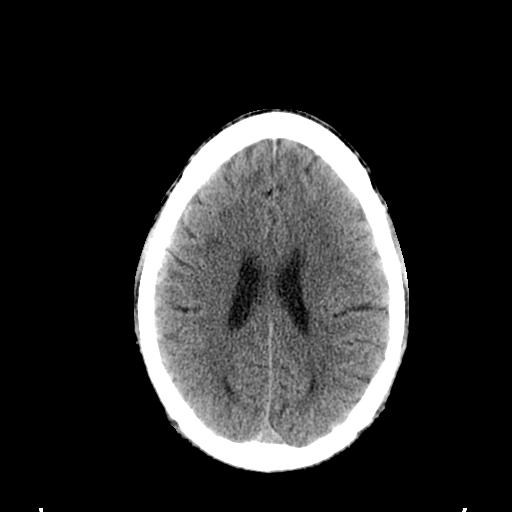
[im 19/36  bone]
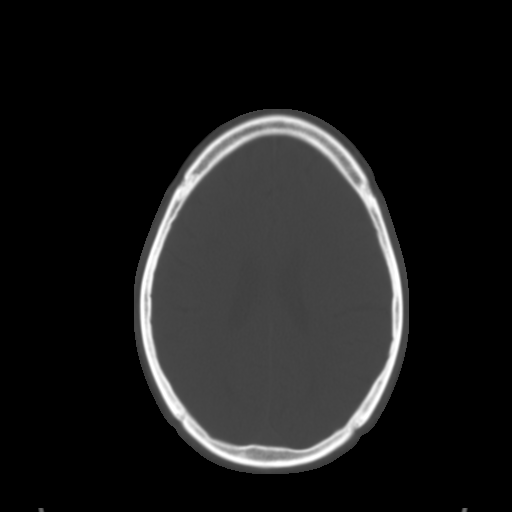
[im 21/36  brain]
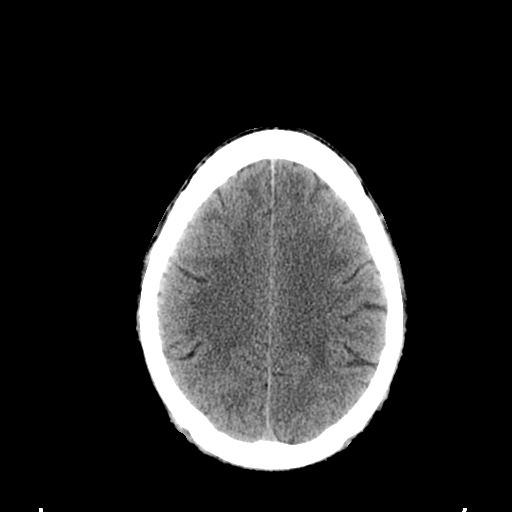
[im 23/36  brain]
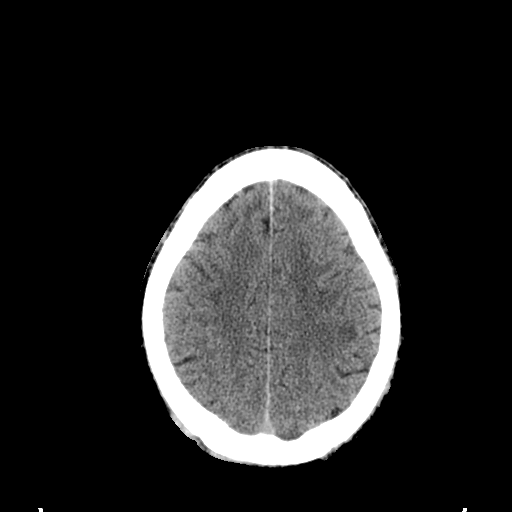
[im 26/36  brain]
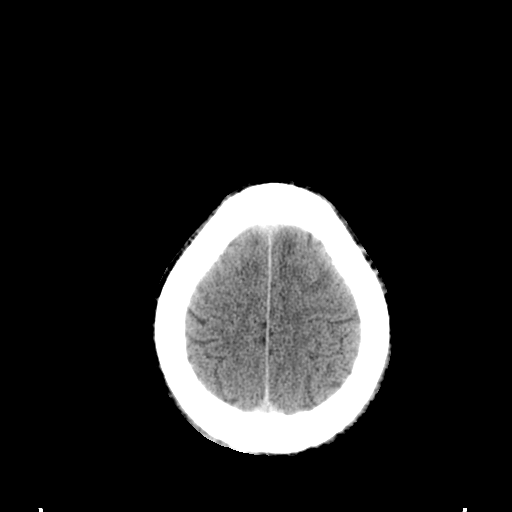
[im 27/36  brain]
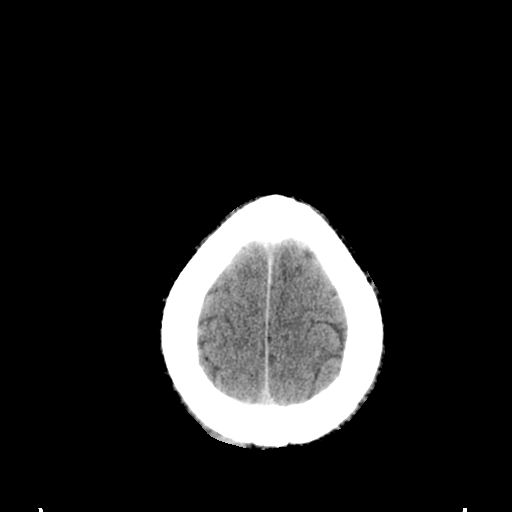
[im 27/36  bone]
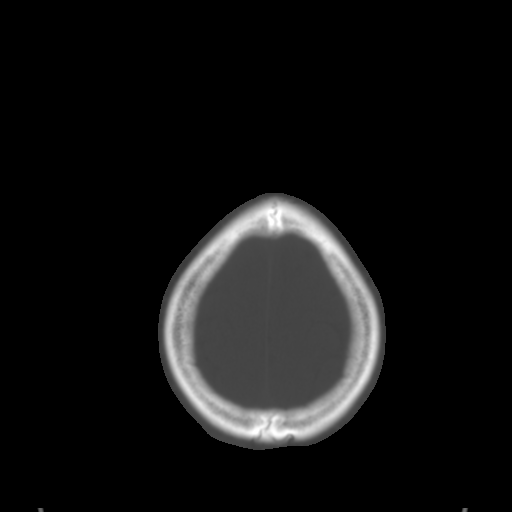
[im 29/36  brain]
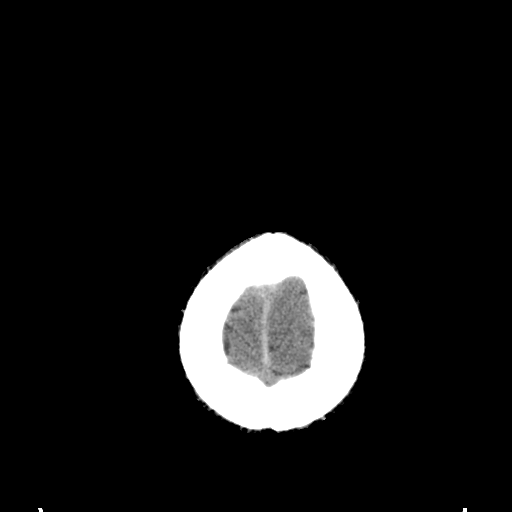
[im 32/36  brain]
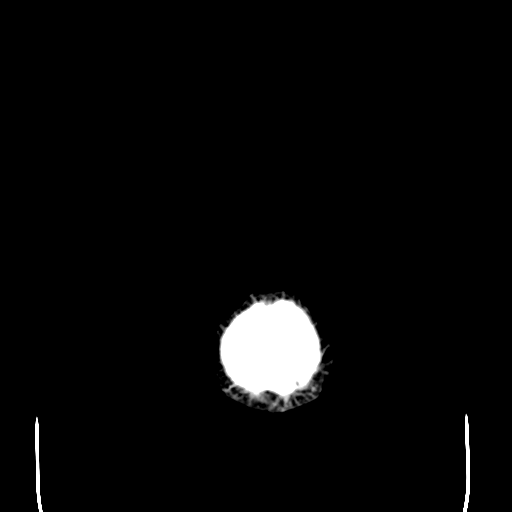
[im 34/36  brain]
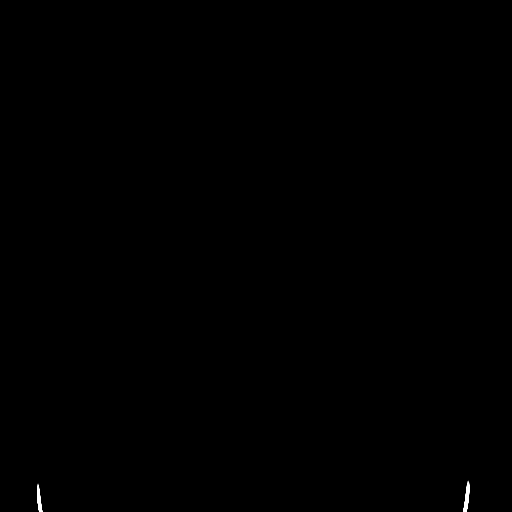

[16 of 30 positions shown; findings below may reference images not displayed]

FINDINGS: Encephalomalacia in the medial inferior left cerebellar hemisphere.
No evidence of parenchymal hemorrhage or extra-axial fluid
collection. No mass lesion, mass effect, or midline shift.

No CT evidence of acute infarction. Intracranial atherosclerosis.
Nonspecific stable subcortical and periventricular white matter
hypodensity, most in keeping with chronic small vessel ischemic
change.

Cerebral volume is otherwise age appropriate. No ventriculomegaly.

The visualized paranasal sinuses are essentially clear. The mastoid
air cells are unopacified. No evidence of calvarial fracture.
IMPRESSION: 1.  No evidence of acute intracranial abnormality.
2. Old left PICA territory infarct.
3. Intracranial atherosclerosis and chronic small vessel ischemic
white matter change.

## 2015-11-12 MED ORDER — HYDROMORPHONE HCL 1 MG/ML IJ SOLN
1.0000 mg | Freq: Once | INTRAMUSCULAR | Status: AC
  Administered 2015-11-12: 1 mg via INTRAVENOUS
  Filled 2015-11-12: qty 1

## 2015-11-12 MED ORDER — SODIUM CHLORIDE 0.9 % IV SOLN
INTRAVENOUS | Status: DC
  Administered 2015-11-12: 17:00:00 via INTRAVENOUS

## 2015-11-12 MED ORDER — PROMETHAZINE HCL 25 MG/ML IJ SOLN
12.5000 mg | Freq: Once | INTRAMUSCULAR | Status: AC
  Administered 2015-11-12: 12.5 mg via INTRAVENOUS
  Filled 2015-11-12: qty 1

## 2015-11-12 MED ORDER — HYDROCODONE-ACETAMINOPHEN 5-325 MG PO TABS: 1.0000 | ORAL_TABLET | Freq: Four times a day (QID) | ORAL | Status: DC | PRN

## 2015-11-12 MED ORDER — DIPHENHYDRAMINE HCL 50 MG/ML IJ SOLN
25.0000 mg | Freq: Once | INTRAMUSCULAR | Status: AC
  Administered 2015-11-12: 25 mg via INTRAVENOUS
  Filled 2015-11-12: qty 1

## 2015-11-12 MED ORDER — ONDANSETRON HCL 4 MG/2ML IJ SOLN
4.0000 mg | Freq: Once | INTRAMUSCULAR | Status: AC
  Administered 2015-11-12: 4 mg via INTRAVENOUS
  Filled 2015-11-12: qty 2

## 2015-11-12 MED ORDER — ENALAPRIL MALEATE 5 MG PO TABS
5.0000 mg | ORAL_TABLET | Freq: Every day | ORAL | Status: DC
  Administered 2015-11-12: 5 mg via ORAL
  Filled 2015-11-12 (×5): qty 1

## 2015-11-12 MED ORDER — ENALAPRIL MALEATE 5 MG PO TABS: 5.0000 mg | ORAL_TABLET | Freq: Every day | ORAL | Status: DC

## 2015-11-12 NOTE — ED Notes (Signed)
Pt assisted to restroom, tolerated well,  

## 2015-11-12 NOTE — ED Notes (Signed)
Pt states that he is suppose to be on vasotec at home with a dosage of 5mg , denies any problems, requesting to be given the same medication, [facility],

## 2015-11-12 NOTE — Discharge Instructions (Signed)
Workup today for the headache without any acute findings. No evidence of any new stroke. Will need to have further blood pressure control will restart she back on your Vasotec. Take as directed. Work with [facility] to get follow-up. May require additional adjustment in blood pressure medicines. Return for any new or worse symptoms.

## 2015-11-12 NOTE — ED Notes (Addendum)
Pt reports sudden onset of LT arm and leg numbness/tingling at around 1200 this afternoon. States numbness went away after a few minutes. Reports shortly after he began having a severe headache and nausea. Pt hx of stroke in Nov 2013. Family denies any slurred speech. Denies any numbness at this time. Dr. Deretha EmoryZackowski notified and at bedside.

## 2015-11-12 NOTE — ED Notes (Signed)
Unable to get updated vitals, pt at MR.

## 2015-11-12 NOTE — ED Provider Notes (Addendum)
CSN: [id number]     Arrival date & time 11/12/15  1627 History   First MD Initiated Contact with Patient 11/12/15 1637     Chief Complaint  Patient presents with  . Headache     (Consider location/radiation/quality/duration/timing/severity/associated sxs/prior Treatment) Patient is a 55 y.o. male presenting with headaches. The history is provided by the patient.  Headache Associated symptoms: nausea and numbness   Associated symptoms: no abdominal pain, no back pain, no congestion, no dizziness, no fever, no neck stiffness, no photophobia, no seizures, no vomiting and no weakness    patient with onset of a severe headache suddenly at 12 noon. Patient had a history of bad headaches in the past but usually not quite this bad has had that bad before. Patient also associated with numbness to his left arm and left anterior leg. No speech problems no visual changes. Patient's had long-standing hypertension but off hypertensive meds for several months. Trying to get back into [facility]. Patient's last medication was Vasotec.  Upon arrival patient evaluated for possible code stroke. Patient states that the headache is predominantly bitemporal and some in the back of the head. 10 out of 10. 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  The numbness has resolved. He is just left with headache. The numbness lasted just for seconds.  Past Medical History  Diagnosis Date  . Hypertension   . Tobacco abuse   . Mood disorder (HCC)     NOS. Admitted to [facility] for detox in 12/2005  . History of cocaine abuse   . History of alcohol abuse   . Hypercholesterolemia   . Anginal pain (HCC)   . Hepatitis B 1991  . Stroke [facility]) 08/05/2012    denies residual (08/05/2012)   Past Surgical History  Procedure Laterality Date  . Mouth surgery  2011    "had to cut tooth out of my gum" (08/05/2012)  . Tee without cardioversion  08/07/2012    Procedure: TRANSESOPHAGEAL ECHOCARDIOGRAM  (TEE);  Surgeon: Vesta Mixer, MD;  Location: [facility] ENDOSCOPY;  Service: Cardiovascular;  Laterality: N/A;   Family History  Problem Relation Age of Onset  . Hypertension Mother   . Osteoarthritis Mother   . Hyperlipidemia Mother   . Prostate cancer Father   . CAD Maternal Grandmother   . Lung cancer Paternal Grandfather     tobacco abuse   Social History  Substance Use Topics  . Smoking status: Current Every Day Smoker -- 1.50 packs/day for 35 years    Types: Cigarettes  . Smokeless tobacco: Current User    Types: Snuff  . Alcohol Use: 1.2 oz/week    2 Cans of beer per week     Comment: 1-2 beers a week / previously drank heavily until 2009 6 pack or more of beer daily.    Review of Systems  Constitutional: Negative for fever.  HENT: Negative for congestion.   Eyes: Positive for visual disturbance. Negative for photophobia.  Respiratory: Negative for shortness of breath.   Cardiovascular: Negative for chest pain.  Gastrointestinal: Positive for nausea. Negative for vomiting and abdominal pain.  Genitourinary: Negative for dysuria.  Musculoskeletal: Negative for back pain and neck stiffness.  Skin: Negative for rash.  Neurological: Positive for numbness and headaches. Negative for dizziness, seizures, facial asymmetry, speech difficulty and weakness.  Hematological: Does not bruise/bleed easily.  Psychiatric/Behavioral: Negative for confusion.      Allergies  Lisinopril and Doxycycline  Home Medications   Prior to  Admission medications   Medication Sig Start Date End Date Taking? Authorizing Provider  amoxicillin (AMOXIL) 500 MG capsule Take 1 capsule (500 mg total) by mouth 3 (three) times daily. 04/05/15   Elson Areas, PA-C  cyclobenzaprine (FLEXERIL) 10 MG tablet Take 1 tablet (10 mg total) by mouth 2 (two) times daily as needed for muscle spasms. Patient not taking: Reported on 04/05/2015 02/27/14   Azalia Bilis, MD  diphenhydrAMINE (BENADRYL) 25 MG tablet Take 1  tablet (25 mg total) by mouth every 6 (six) hours as needed for itching (Rash). Patient not taking: Reported on 04/05/2015 04/01/14   Eber Hong, MD  enalapril (VASOTEC) 5 MG tablet Take 1 tablet (5 mg total) by mouth daily. 11/12/15   Vanetta Mulders, MD  famotidine (PEPCID) 20 MG tablet Take 1 tablet (20 mg total) by mouth 2 (two) times daily. Patient not taking: Reported on 04/05/2015 04/01/14   Eber Hong, MD  hydrochlorothiazide (HYDRODIURIL) 25 MG tablet Take 1 tablet (25 mg total) by mouth daily. Patient not taking: Reported on 04/05/2015 04/01/14   Eber Hong, MD  HYDROcodone-acetaminophen (NORCO/VICODIN) 5-325 MG tablet Take 1-2 tablets by mouth every 6 (six) hours as needed. 11/12/15   Vanetta Mulders, MD  ibuprofen (ADVIL,MOTRIN) 200 MG tablet Take 400 mg by mouth every 6 (six) hours as needed for mild pain.    Historical Provider, MD  naproxen (NAPROSYN) 500 MG tablet Take 1 tablet (500 mg total) by mouth 2 (two) times daily. Patient not taking: Reported on 04/05/2015 02/27/14   Azalia Bilis, MD  predniSONE (DELTASONE) 20 MG tablet Take 2 tablets (40 mg total) by mouth daily. Patient not taking: Reported on 04/05/2015 04/01/14   Eber Hong, MD  traMADol (ULTRAM) 50 MG tablet Take 1 tablet (50 mg total) by mouth every 6 (six) hours as needed. 04/05/15   Elson Areas, PA-C   BP 205/106 mmHg  Pulse 50  Temp(Src) 97.5 F (36.4 C) (Axillary)  Resp 16  SpO2 95% Physical Exam  Constitutional: He is oriented to person, place, and time. He appears well-developed and well-nourished. No distress.  HENT:  Head: Normocephalic and atraumatic.  Mouth/Throat: Oropharynx is clear and moist.  Eyes: Conjunctivae and EOM are normal. Pupils are equal, round, and reactive to light.  Neck: Neck supple.  Cardiovascular: Normal rate and regular rhythm.   No murmur heard. Pulmonary/Chest: Effort normal and breath sounds normal. No respiratory distress.  Abdominal: Soft. Bowel sounds are normal. There is  no tenderness.  Musculoskeletal: Normal range of motion.  Neurological: He is alert and oriented to person, place, and time. No cranial nerve deficit. He exhibits normal muscle tone. Coordination normal.  Skin: Skin is warm. No rash noted.  Nursing note and vitals reviewed.   ED Course  Procedures (including critical care time) Labs Review Labs Reviewed  COMPREHENSIVE METABOLIC PANEL - Abnormal; Notable for the following:    Chloride 100 (*)    Total Protein 8.3 (*)    All other components within normal limits  URINE RAPID DRUG SCREEN, HOSP PERFORMED - Abnormal; Notable for the following:    Tetrahydrocannabinol POSITIVE (*)    All other components within normal limits  I-STAT CHEM 8, ED - Abnormal; Notable for the following:    Chloride 97 (*)    All other components within normal limits  ETHANOL  PROTIME-INR  APTT  CBC  DIFFERENTIAL  URINALYSIS, ROUTINE W REFLEX MICROSCOPIC (NOT AT [facility])  CBG MONITORING, ED  Rosezena Sensor, ED  Imaging Review Ct Head Wo Contrast  11/12/2015  CLINICAL DATA:  Headaches since noon. Blurred vision, dizziness and nausea. EXAM: CT HEAD WITHOUT CONTRAST TECHNIQUE: Contiguous axial images were obtained from the base of the skull through the vertex without intravenous contrast. COMPARISON:  08/05/2012 head CT. FINDINGS: Encephalomalacia in the medial inferior left cerebellar hemisphere. No evidence of parenchymal hemorrhage or extra-axial fluid collection. No mass lesion, mass effect, or midline shift. No CT evidence of acute infarction. Intracranial atherosclerosis. Nonspecific stable subcortical and periventricular white matter hypodensity, most in keeping with chronic small vessel ischemic change. Cerebral volume is otherwise age appropriate. No ventriculomegaly. The visualized paranasal sinuses are essentially clear. The mastoid air cells are unopacified. No evidence of calvarial fracture. IMPRESSION: 1.  No evidence of acute intracranial  abnormality. 2. 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 3. Intracranial atherosclerosis and chronic small vessel ischemic white matter change. Electronically Signed   By: Delbert PhenixJason A Poff M.D.   On: 11/12/2015 17:21   Mr Brain Wo Contrast  11/12/2015  CLINICAL DATA:  Sudden onset of left-sided numbness that has resolved. Headache and blurred vision for cysts. EXAM: MRI HEAD WITHOUT CONTRAST TECHNIQUE: Multiplanar, multiecho pulse sequences of the brain and surrounding structures were obtained without intravenous contrast. COMPARISON:  Head CT same day.  MRI 08/05/2012. FINDINGS: Diffusion imaging does not show any acute or subacute infarction. There is old infarction affecting the inferior cerebellum on the left. There chronic small-vessel ischemic changes affecting the pons. There are moderate changes of chronic small vessel ischemia affecting the cerebral hemispheric deep and subcortical white matter bilaterally. No anterior circulation large vessel territory infarction. No mass lesion, hemorrhage, hydrocephalus or extra-axial collection. No pituitary mass. No inflammatory sinus disease. No skull or skullbase lesion. IMPRESSION: No acute finding. Old left PICA distribution stroke. Chronic small-vessel ischemic changes affecting the brainstem and cerebral hemispheric white matter, slightly progressive since 2013. Electronically Signed   By: Paulina FusiMark  Shogry M.D.   On: 11/12/2015 18:32   I have personally reviewed and evaluated these images and lab results as part of my medical decision-making.   EKG Interpretation   Date/Time:  Friday November 12 2015 16:40:16 EST Ventricular Rate:  55 PR Interval:  179 QRS Duration: 103 QT Interval:  437 QTC Calculation: 418 R Axis:   64 Text Interpretation:  Sinus rhythm Multiple premature complexes, vent &  supraven Anteroseptal infarct, age indeterminate Confirmed by Bryannah Boston   MD, Hridhaan Yohn [phone]([id number]) on 11/12/2015 4:44:24 PM      MDM   Final diagnoses:  Acute nonintractable  headache, unspecified headache type  Essential hypertension    Patient presented him with Jude onset of headache at noon associated with numbness to left arm and left anterior leg. No speech problems. No visual changes. Headache associated with nausea but no vomiting. Has had a history of bad headaches in the past also and 2013 did have a previous stroke.  Patient was evaluated as possible code stroke upon arrival but since symptoms had resolved the headache just went through normal of workup. Head CT was negative for any acute findings. MRI was negative for any acute findings. Patient's blood pressure improved down to 164/94 with pain control of the headache. Patient's had long-standing hypertension treated with Vasotec in the past or Lattie CornsMorse recently but has not been on any meds for several months. Patient is trying to get in to follow-up with [facility] in [place].  Patient overall improved here blood pressure still running high but do not feel it's is  a hypertensive emergency because headache is improved. No new neuro focal deficits. Will restart Vasotec. Have him follow-up with family medicine. Return for any new or worse symptoms.    Vanetta Mulders, MD 11/12/15 1958  Vanetta Mulders, MD 11/12/15 1958

## 2015-11-12 NOTE — ED Notes (Signed)
Pt requested to give urine specimen when available.

## 2015-11-28 DIAGNOSIS — E785 Hyperlipidemia, unspecified: Secondary | ICD-10-CM | POA: Insufficient documentation

## 2015-11-28 DIAGNOSIS — F17200 Nicotine dependence, unspecified, uncomplicated: Secondary | ICD-10-CM | POA: Insufficient documentation

## 2015-11-28 DIAGNOSIS — B1910 Unspecified viral hepatitis B without hepatic coma: Secondary | ICD-10-CM | POA: Insufficient documentation

## 2015-11-28 DIAGNOSIS — I209 Angina pectoris, unspecified: Secondary | ICD-10-CM | POA: Insufficient documentation

## 2015-11-28 DIAGNOSIS — F39 Unspecified mood [affective] disorder: Secondary | ICD-10-CM | POA: Insufficient documentation

## 2015-11-28 DIAGNOSIS — I10 Essential (primary) hypertension: Secondary | ICD-10-CM | POA: Insufficient documentation

## 2016-06-08 DIAGNOSIS — R7301 Impaired fasting glucose: Secondary | ICD-10-CM | POA: Insufficient documentation

## 2016-08-24 DIAGNOSIS — M7712 Lateral epicondylitis, left elbow: Secondary | ICD-10-CM | POA: Insufficient documentation

## 2016-08-24 DIAGNOSIS — N400 Enlarged prostate without lower urinary tract symptoms: Secondary | ICD-10-CM | POA: Insufficient documentation

## 2017-04-16 ENCOUNTER — Encounter (HOSPITAL_COMMUNITY): Payer: Self-pay | Admitting: Emergency Medicine

## 2017-04-16 ENCOUNTER — Emergency Department (HOSPITAL_COMMUNITY)
Admission: EM | Admit: 2017-04-16 | Discharge: 2017-04-16 | Disposition: A | Discharge status: Home / Self Care | Payer: BLUE CROSS/BLUE SHIELD | Attending: Emergency Medicine | Admitting: Emergency Medicine

## 2017-04-16 ENCOUNTER — Emergency Department (HOSPITAL_COMMUNITY): Payer: BLUE CROSS/BLUE SHIELD

## 2017-04-16 DIAGNOSIS — R0789 Other chest pain: Secondary | ICD-10-CM | POA: Insufficient documentation

## 2017-04-16 DIAGNOSIS — R42 Dizziness and giddiness: Secondary | ICD-10-CM | POA: Diagnosis present

## 2017-04-16 DIAGNOSIS — R51 Headache: Secondary | ICD-10-CM | POA: Insufficient documentation

## 2017-04-16 DIAGNOSIS — I10 Essential (primary) hypertension: Secondary | ICD-10-CM | POA: Diagnosis not present

## 2017-04-16 DIAGNOSIS — R197 Diarrhea, unspecified: Secondary | ICD-10-CM | POA: Insufficient documentation

## 2017-04-16 DIAGNOSIS — Z9114 Patient's other noncompliance with medication regimen: Secondary | ICD-10-CM | POA: Diagnosis not present

## 2017-04-16 DIAGNOSIS — F17210 Nicotine dependence, cigarettes, uncomplicated: Secondary | ICD-10-CM | POA: Insufficient documentation

## 2017-04-16 LAB — BASIC METABOLIC PANEL
ANION GAP: 8 (ref 5–15)
BUN: 8 mg/dL (ref 6–20)
CALCIUM: 9.3 mg/dL (ref 8.9–10.3)
CO2: 25 mmol/L (ref 22–32)
CREATININE: 0.9 mg/dL (ref 0.61–1.24)
Chloride: 100 mmol/L — ABNORMAL LOW (ref 101–111)
Glucose, Bld: 149 mg/dL — ABNORMAL HIGH (ref 65–99)
Potassium: 3.7 mmol/L (ref 3.5–5.1)
SODIUM: 133 mmol/L — AB (ref 135–145)

## 2017-04-16 LAB — CBC WITH DIFFERENTIAL/PLATELET
BASOS ABS: 0 10*3/uL (ref 0.0–0.1)
BASOS PCT: 0 %
EOS ABS: 0 10*3/uL (ref 0.0–0.7)
Eosinophils Relative: 0 %
HCT: 43.4 % (ref 39.0–52.0)
HEMOGLOBIN: 15.3 g/dL (ref 13.0–17.0)
Lymphocytes Relative: 18 %
Lymphs Abs: 1.9 10*3/uL (ref 0.7–4.0)
MCH: 32 pg (ref 26.0–34.0)
MCHC: 35.3 g/dL (ref 30.0–36.0)
MCV: 90.8 fL (ref 78.0–100.0)
MONOS PCT: 4 %
Monocytes Absolute: 0.4 10*3/uL (ref 0.1–1.0)
NEUTROS ABS: 8.4 10*3/uL — AB (ref 1.7–7.7)
Neutrophils Relative %: 78 %
Platelets: 220 10*3/uL (ref 150–400)
RBC: 4.78 MIL/uL (ref 4.22–5.81)
RDW: 13.2 % (ref 11.5–15.5)
WBC: 10.7 10*3/uL — ABNORMAL HIGH (ref 4.0–10.5)

## 2017-04-16 LAB — TROPONIN I

## 2017-04-16 MED ORDER — BISOPROLOL-HYDROCHLOROTHIAZIDE 5-6.25 MG PO TABS: 1.0000 | ORAL_TABLET | Freq: Every day | ORAL | 0 refills | Status: DC

## 2017-04-16 MED ORDER — METOCLOPRAMIDE HCL 5 MG/ML IJ SOLN
10.0000 mg | Freq: Once | INTRAMUSCULAR | Status: AC
  Administered 2017-04-16: 10 mg via INTRAVENOUS
  Filled 2017-04-16: qty 2

## 2017-04-16 MED ORDER — LORAZEPAM 2 MG/ML IJ SOLN
1.0000 mg | Freq: Once | INTRAMUSCULAR | Status: AC
  Administered 2017-04-16: 1 mg via INTRAVENOUS
  Filled 2017-04-16: qty 1

## 2017-04-16 MED ORDER — DIPHENHYDRAMINE HCL 50 MG/ML IJ SOLN
25.0000 mg | Freq: Once | INTRAMUSCULAR | Status: AC
  Administered 2017-04-16: 25 mg via INTRAVENOUS
  Filled 2017-04-16: qty 1

## 2017-04-16 NOTE — ED Triage Notes (Addendum)
Pt reports last known well was 0900 this morning. Pt began having headache, dizziness and diaphoresis while at work. Pt then started having diarrhea and abd pain. PT denies unilateral weakness but does report change in speech.  Pt is speaking in full sentences at this time, alert and oriented. Denies n/v.

## 2017-04-16 NOTE — ED Notes (Signed)
Dr. Ethelda ChickJacubowitz notified of pt symptoms and given EKG.

## 2017-04-16 NOTE — ED Provider Notes (Addendum)
[facility] DEPT Provider Note   CSN: [id number] Arrival date & time: 04/16/17  1616     History   Chief Complaint Chief Complaint  Patient presents with  . Dizziness  . Headache  . Abdominal Pain    HPI [name] is a [age] y.o. male.Complains of frontal headache gradual onset 11 AM today accompanied by dizziness meaning room spinning and feeling off off balance. His wife reports that he is "speaking slower than normal." He also has had 2 episodes of diarrhea, nonbloody nonbilious and nausea. No visual changes. Also complained of 2 episodes of chest pain lasting a split-second each. He denies any fever. Treated with ibuprofen this morning, without relief.. He's been noncompliant with his antihypertensive medicine and statin medication for 2 months  HPI  Past Medical History:  Diagnosis Date  . Anginal pain (HCC)   . Hepatitis B 1991  . History of alcohol abuse   . History of cocaine abuse   . Hypercholesterolemia   . Hypertension   . Mood disorder (HCC)    NOS. [facility]  . Stroke (HCC) 08/05/2012   denies residual (08/05/2012)  . Tobacco abuse     Patient Active Problem List   Diagnosis Date Noted  . Stroke (HCC) 08/05/2012  . Hypertension 08/05/2012  . Tobacco abuse 08/05/2012  . History of cocaine abuse   . History of alcohol abuse     Past Surgical History:  Procedure Laterality Date  . MOUTH SURGERY  2011   "had to cut tooth out of my gum" (08/05/2012)  . TEE WITHOUT CARDIOVERSION  08/07/2012   Procedure: TRANSESOPHAGEAL ECHOCARDIOGRAM (TEE);  Surgeon: Vesta MixerPhilip J Nahser, MD;  [facility];  Service: Cardiovascular;  Laterality: N/A;       Home Medications    Prior to Admission medications   Medication Sig Start Date End Date Taking? Authorizing Provider  amoxicillin (AMOXIL) 500 MG capsule Take 1 capsule (500 mg total) by mouth 3 (three) times daily. 04/05/15   Elson AreasSofia, Leslie K, PA-C  cyclobenzaprine (FLEXERIL) 10  MG tablet Take 1 tablet (10 mg total) by mouth 2 (two) times daily as needed for muscle spasms. Patient not taking: Reported on 04/05/2015 02/27/14   Azalia Bilisampos, Kevin, MD  diphenhydrAMINE (BENADRYL) 25 MG tablet Take 1 tablet (25 mg total) by mouth every 6 (six) hours as needed for itching (Rash). Patient not taking: Reported on 04/05/2015 04/01/14   Eber HongMiller, Brian, MD  enalapril (VASOTEC) 5 MG tablet Take 1 tablet (5 mg total) by mouth daily. 11/12/15   Vanetta MuldersZackowski, Scott, MD  famotidine (PEPCID) 20 MG tablet Take 1 tablet (20 mg total) by mouth 2 (two) times daily. Patient not taking: Reported on 04/05/2015 04/01/14   Eber HongMiller, Brian, MD  hydrochlorothiazide (HYDRODIURIL) 25 MG tablet Take 1 tablet (25 mg total) by mouth daily. Patient not taking: Reported on 04/05/2015 04/01/14   Eber HongMiller, Brian, MD  HYDROcodone-acetaminophen (NORCO/VICODIN) 5-325 MG tablet Take 1-2 tablets by mouth every 6 (six) hours as needed. 11/12/15   Vanetta MuldersZackowski, Scott, MD  ibuprofen (ADVIL,MOTRIN) 200 MG tablet Take 400 mg by mouth every 6 (six) hours as needed for mild pain.    [provider]  naproxen (NAPROSYN) 500 MG tablet Take 1 tablet (500 mg total) by mouth 2 (two) times daily. Patient not taking: Reported on 04/05/2015 02/27/14   Azalia Bilisampos, Kevin, MD  predniSONE (DELTASONE) 20 MG tablet Take 2 tablets (40 mg total) by mouth daily. Patient not taking: Reported on  04/05/2015 04/01/14   Eber Hong, MD  traMADol (ULTRAM) 50 MG tablet Take 1 tablet (50 mg total) by mouth every 6 (six) hours as needed. 04/05/15   Elson Areas, PA-C    Family History Family History  Problem Relation Age of Onset  . Hypertension Mother   . Osteoarthritis Mother   . Hyperlipidemia Mother   . Prostate cancer Father   . CAD Maternal Grandmother   . Lung cancer Paternal Grandfather        tobacco abuse    Social History Social History  Substance Use Topics  . Smoking status: Current Every Day Smoker    Packs/day: 1.50    Years: 35.00     Types: Cigarettes  . Smokeless tobacco: Current User    Types: Snuff  . Alcohol use 1.2 oz/week    2 Cans of beer per week     Comment: 1-2 beers a week / previously drank heavily until 2009 6 pack or more of beer daily.   Last used cocaine 2 years ago. No history of IV drug use. Positive marijuana use  Allergies   Lisinopril and Doxycycline   Review of Systems Review of Systems  Constitutional: Negative.   HENT: Negative.   Respiratory: Negative.   Cardiovascular: Positive for chest pain.       2 episodes this morning each lasting a split-second  Gastrointestinal: Negative.  Negative for abdominal pain.  Musculoskeletal: Negative.   Skin: Negative.   Neurological: Positive for dizziness, speech difficulty and headaches.       Speech "slow."  Psychiatric/Behavioral: Negative.   All other systems reviewed and are negative.    Physical Exam Updated Vital Signs BP (!) 179/107 (BP Location: Left Arm)   Pulse 60   Temp 98.2 F (36.8 C) (Oral)   Resp 19   Ht 5\' 7"  (1.702 m)   Wt 83.9 kg (185 lb)   SpO2 98%   BMI 28.98 kg/m   Physical Exam  Constitutional: He is oriented to person, place, and time. He appears well-developed and well-nourished.  HENT:  Head: Normocephalic and atraumatic.  No facial asymmetry bilateral tympanic membranes normal  Eyes: Pupils are equal, round, and reactive to light. Conjunctivae are normal.  Neck: Neck supple. No tracheal deviation present. No thyromegaly present.  Cardiovascular: Normal rate and regular rhythm.   No murmur heard. Pulmonary/Chest: Effort normal and breath sounds normal.  Abdominal: Soft. Bowel sounds are normal. He exhibits no distension. There is no tenderness.  Musculoskeletal: Normal range of motion. He exhibits no edema or tenderness.  Neurological: He is alert and oriented to person, place, and time. No cranial nerve deficit. Coordination normal.  Gait normal Romberg normal pronator drift normal DTR symmetric  bilaterally at knee jerk ankle jerk biceps toes downward going bilaterally finger to nose normal  Skin: Skin is warm and dry. No rash noted.  Psychiatric: He has a normal mood and affect.  Nursing note and vitals reviewed.    ED Treatments / Results  Labs (all labs ordered are listed, but only abnormal results are displayed) Labs Reviewed  CBC WITH DIFFERENTIAL/PLATELET - Abnormal; Notable for the following:       Result Value   WBC 10.7 (*)    Neutro Abs 8.4 (*)    All other components within normal limits  BASIC METABOLIC PANEL  TROPONIN I    EKG  EKG Interpretation  Date/Time:  Monday April 16 2017 16:28:57 EDT Ventricular Rate:  68 PR Interval:  QRS Duration: 146 QT Interval:  408 QTC Calculation: 434 R Axis:   23 Text Interpretation:  Sinus rhythm Right bundle branch block Right bundle branch block New since previous tracing Confirmed by Doug Sou [phone]) on 04/16/2017 4:57:34 PM       Radiology No results found.  Procedures Procedures (including critical care time)  Medications Ordered in ED Medications  metoCLOPramide (REGLAN) injection 10 mg (10 mg Intravenous Given 04/16/17 1700)  diphenhydrAMINE (BENADRYL) injection 25 mg (25 mg Intravenous Given 04/16/17 1700)   Results for orders placed or performed during the hospital encounter of 04/16/17  Basic metabolic panel  Result Value Ref Range   Sodium 133 (L) 135 - 145 mmol/L   Potassium 3.7 3.5 - 5.1 mmol/L   Chloride 100 (L) 101 - 111 mmol/L   CO2 25 22 - 32 mmol/L   Glucose, Bld 149 (H) 65 - 99 mg/dL   BUN 8 6 - 20 mg/dL   Creatinine, Ser 9.62 0.61 - 1.24 mg/dL   Calcium 9.3 8.9 - 95.2 mg/dL   GFR calc non Af Amer >60 >60 mL/min   GFR calc Af Amer >60 >60 mL/min   Anion gap 8 5 - 15  CBC with Differential/Platelet  Result Value Ref Range   WBC 10.7 (H) 4.0 - 10.5 K/uL   RBC 4.78 4.22 - 5.81 MIL/uL   Hemoglobin 15.3 13.0 - 17.0 g/dL   HCT 84.1 32.4 - 40.1 %   MCV 90.8 78.0 - 100.0 fL   MCH  32.0 26.0 - 34.0 pg   MCHC 35.3 30.0 - 36.0 g/dL   RDW 02.7 25.3 - 66.4 %   Platelets 220 150 - 400 K/uL   Neutrophils Relative % 78 %   Neutro Abs 8.4 (H) 1.7 - 7.7 K/uL   Lymphocytes Relative 18 %   Lymphs Abs 1.9 0.7 - 4.0 K/uL   Monocytes Relative 4 %   Monocytes Absolute 0.4 0.1 - 1.0 K/uL   Eosinophils Relative 0 %   Eosinophils Absolute 0.0 0.0 - 0.7 K/uL   Basophils Relative 0 %   Basophils Absolute 0.0 0.0 - 0.1 K/uL  Troponin I  Result Value Ref Range   Troponin I <0.03 <0.03 ng/mL  Troponin I  Result Value Ref Range   Troponin I <0.03 <0.03 ng/mL   Mr Brain Wo Contrast  Result Date: 04/16/2017 CLINICAL DATA:  56 y/o  M; vertiginous syndrome. EXAM: MRI HEAD WITHOUT CONTRAST TECHNIQUE: Multiplanar, multiecho pulse sequences of the brain and surrounding structures were obtained without intravenous contrast. COMPARISON:  11/12/2015 MRI of the head. FINDINGS: Brain: No acute infarction, hemorrhage, hydrocephalus, extra-axial collection or mass lesion. Stable left PICA chronic infarction. Several scattered nonspecific foci of T2 FLAIR hyperintense signal abnormality in subcortical and periventricular white matter is compatible with moderate chronic microvascular ischemic changes for age. Mild brain parenchymal volume loss. Vascular: Normal flow voids. Skull and upper cervical spine: Normal marrow signal. Sinuses/Orbits: Negative. Other: None. IMPRESSION: 1. No acute intracranial abnormality identified. 2. Stable moderate chronic microvascular ischemic changes and mild parenchymal volume loss of the brain. Stable left PICA chronic infarction. Electronically Signed   By: Mitzi Hansen M.D.   On: 04/16/2017 18:02   Results for orders placed or performed during the hospital encounter of 04/16/17  Basic metabolic panel  Result Value Ref Range   Sodium 133 (L) 135 - 145 mmol/L   Potassium 3.7 3.5 - 5.1 mmol/L   Chloride 100 (L) 101 - 111 mmol/L  CO2 25 22 - 32 mmol/L    Glucose, Bld 149 (H) 65 - 99 mg/dL   BUN 8 6 - 20 mg/dL   Creatinine, Ser 1.61 0.61 - 1.24 mg/dL   Calcium 9.3 8.9 - 09.6 mg/dL   GFR calc non Af Amer >60 >60 mL/min   GFR calc Af Amer >60 >60 mL/min   Anion gap 8 5 - 15  CBC with Differential/Platelet  Result Value Ref Range   WBC 10.7 (H) 4.0 - 10.5 K/uL   RBC 4.78 4.22 - 5.81 MIL/uL   Hemoglobin 15.3 13.0 - 17.0 g/dL   HCT 04.5 40.9 - 81.1 %   MCV 90.8 78.0 - 100.0 fL   MCH 32.0 26.0 - 34.0 pg   MCHC 35.3 30.0 - 36.0 g/dL   RDW 91.4 78.2 - 95.6 %   Platelets 220 150 - 400 K/uL   Neutrophils Relative % 78 %   Neutro Abs 8.4 (H) 1.7 - 7.7 K/uL   Lymphocytes Relative 18 %   Lymphs Abs 1.9 0.7 - 4.0 K/uL   Monocytes Relative 4 %   Monocytes Absolute 0.4 0.1 - 1.0 K/uL   Eosinophils Relative 0 %   Eosinophils Absolute 0.0 0.0 - 0.7 K/uL   Basophils Relative 0 %   Basophils Absolute 0.0 0.0 - 0.1 K/uL  Troponin I  Result Value Ref Range   Troponin I <0.03 <0.03 ng/mL  Troponin I  Result Value Ref Range   Troponin I <0.03 <0.03 ng/mL   Mr Brain Wo Contrast  Result Date: 04/16/2017 CLINICAL DATA:  55 y/o  M; vertiginous syndrome. EXAM: MRI HEAD WITHOUT CONTRAST TECHNIQUE: Multiplanar, multiecho pulse sequences of the brain and surrounding structures were obtained without intravenous contrast. COMPARISON:  11/12/2015 MRI of the head. FINDINGS: Brain: No acute infarction, hemorrhage, hydrocephalus, extra-axial collection or mass lesion. Stable left PICA chronic infarction. Several scattered nonspecific foci of T2 FLAIR hyperintense signal abnormality in subcortical and periventricular white matter is compatible with moderate chronic microvascular ischemic changes for age. Mild brain parenchymal volume loss. Vascular: Normal flow voids. Skull and upper cervical spine: Normal marrow signal. Sinuses/Orbits: Negative. Other: None. IMPRESSION: 1. No acute intracranial abnormality identified. 2. Stable moderate chronic microvascular ischemic  changes and mild parenchymal volume loss of the brain. Stable left PICA chronic infarction. Electronically Signed   By: Mitzi Hansen M.D.   On: 04/16/2017 18:02   Initial Impression / Assessment and Plan / ED Course  I have reviewed the triage vital signs and the nursing notes.  Pertinent labs & imaging results that were available during my care of the patient were reviewed by me and considered in my medical decision making (see chart for details).   patient required dose of Ativan as he became claustrophobic in the MRI scanner.  At 9:30 PM he is asymptomatic, pain-free ambulates without difficulty Plan I'll write prescriptions for [facility]. I've instructed him to follow up with his primary care physician at [facility] . I counseled patient for 5 minutes on smoking cessation Final Clinical Impressions(s) / ED Diagnoses  Diagnosis #1 vertigo #2 nonspecific headache #3 atypical chest pain #4 diarrhea #5 tobacco abuse #6 medication noncompliance Final diagnoses:  None  #7 elevated blood pressure  New Prescriptions New Prescriptions   No medications on file     Doug Sou, MD 04/16/17 2142    Doug Sou, MD 04/16/17 2130    Doug Sou, MD 04/16/17 2148

## 2017-04-16 NOTE — ED Notes (Signed)
Patient verbalizes understanding of discharge instructions, prescriptions, home care and follow up care. Patient out of department at this time with family. 

## 2017-04-16 NOTE — Discharge Instructions (Signed)
Take your blood pressure medication tomorrow as prescribed. Call your physician at [facility] to arrange to get your blood pressure rechecked in a week. Today's was elevated at 185/105. Ask your doctor to help you to stop smoking.

## 2018-03-01 ENCOUNTER — Telehealth: Payer: Self-pay | Admitting: *Deleted

## 2018-03-01 DIAGNOSIS — N528 Other male erectile dysfunction: Secondary | ICD-10-CM | POA: Insufficient documentation

## 2018-03-01 NOTE — Telephone Encounter (Signed)
[facility], [place], [place], [phone], NOTES ON FILE.Marland Kitchen.Marland Kitchen..Marland Kitchen

## 2018-03-25 ENCOUNTER — Ambulatory Visit: Payer: BLUE CROSS/BLUE SHIELD | Admitting: Cardiovascular Disease

## 2018-03-26 ENCOUNTER — Encounter: Payer: Self-pay | Admitting: Cardiovascular Disease

## 2019-01-23 ENCOUNTER — Encounter (HOSPITAL_COMMUNITY): Payer: Self-pay | Admitting: Emergency Medicine

## 2019-01-23 ENCOUNTER — Observation Stay (HOSPITAL_COMMUNITY)
Admission: EM | Admit: 2019-01-23 | Discharge: 2019-01-24 | Disposition: A | Discharge status: Home / Self Care | Payer: BLUE CROSS/BLUE SHIELD | Attending: Internal Medicine | Admitting: Internal Medicine

## 2019-01-23 ENCOUNTER — Emergency Department (HOSPITAL_COMMUNITY): Payer: BLUE CROSS/BLUE SHIELD

## 2019-01-23 ENCOUNTER — Other Ambulatory Visit: Payer: Self-pay

## 2019-01-23 DIAGNOSIS — R0789 Other chest pain: Secondary | ICD-10-CM | POA: Diagnosis not present

## 2019-01-23 DIAGNOSIS — R072 Precordial pain: Secondary | ICD-10-CM

## 2019-01-23 DIAGNOSIS — Z20828 Contact with and (suspected) exposure to other viral communicable diseases: Secondary | ICD-10-CM | POA: Insufficient documentation

## 2019-01-23 DIAGNOSIS — F17210 Nicotine dependence, cigarettes, uncomplicated: Secondary | ICD-10-CM | POA: Insufficient documentation

## 2019-01-23 DIAGNOSIS — I10 Essential (primary) hypertension: Secondary | ICD-10-CM | POA: Diagnosis not present

## 2019-01-23 DIAGNOSIS — Z8673 Personal history of transient ischemic attack (TIA), and cerebral infarction without residual deficits: Secondary | ICD-10-CM | POA: Diagnosis not present

## 2019-01-23 DIAGNOSIS — R42 Dizziness and giddiness: Secondary | ICD-10-CM | POA: Insufficient documentation

## 2019-01-23 DIAGNOSIS — Z79899 Other long term (current) drug therapy: Secondary | ICD-10-CM | POA: Insufficient documentation

## 2019-01-23 DIAGNOSIS — Z720 Tobacco use: Secondary | ICD-10-CM | POA: Diagnosis present

## 2019-01-23 DIAGNOSIS — R05 Cough: Secondary | ICD-10-CM | POA: Insufficient documentation

## 2019-01-23 DIAGNOSIS — R109 Unspecified abdominal pain: Secondary | ICD-10-CM

## 2019-01-23 DIAGNOSIS — I160 Hypertensive urgency: Secondary | ICD-10-CM | POA: Diagnosis not present

## 2019-01-23 DIAGNOSIS — R079 Chest pain, unspecified: Secondary | ICD-10-CM | POA: Diagnosis present

## 2019-01-23 LAB — TROPONIN I
Troponin I: <0.03 ng/mL (ref <0.03)
Troponin I: 0.03 ng/mL (ref <0.03)
Troponin I: 0.03 ng/mL (ref <0.03)

## 2019-01-23 LAB — URINALYSIS, ROUTINE W REFLEX MICROSCOPIC
Bilirubin Urine: NEGATIVE
Glucose, UA: NEGATIVE mg/dL
Hgb urine dipstick: NEGATIVE
Ketones, ur: NEGATIVE mg/dL
Leukocytes,Ua: NEGATIVE
Nitrite: NEGATIVE
Protein, ur: NEGATIVE mg/dL
Specific Gravity, Urine: 1.008 (ref 1.005–1.030)
pH: 6 (ref 5.0–8.0)

## 2019-01-23 LAB — CBC WITH DIFFERENTIAL/PLATELET
Abs Immature Granulocytes: 0.02 10*3/uL (ref 0.00–0.07)
Basophils Absolute: 0 10*3/uL (ref 0.0–0.1)
Basophils Relative: 1 %
Eosinophils Absolute: 0.1 10*3/uL (ref 0.0–0.5)
Eosinophils Relative: 2 %
HCT: 45.9 % (ref 39.0–52.0)
Hemoglobin: 15.6 g/dL (ref 13.0–17.0)
Immature Granulocytes: 0 %
Lymphocytes Relative: 39 %
Lymphs Abs: 3 10*3/uL (ref 0.7–4.0)
MCH: 32.8 pg (ref 26.0–34.0)
MCHC: 34 g/dL (ref 30.0–36.0)
MCV: 96.6 fL (ref 80.0–100.0)
Monocytes Absolute: 0.6 10*3/uL (ref 0.1–1.0)
Monocytes Relative: 8 %
Neutro Abs: 3.8 10*3/uL (ref 1.7–7.7)
Neutrophils Relative %: 50 %
Platelets: 244 10*3/uL (ref 150–400)
RBC: 4.75 MIL/uL (ref 4.22–5.81)
RDW: 12.8 % (ref 11.5–15.5)
WBC: 7.6 10*3/uL (ref 4.0–10.5)
nRBC: 0 % (ref 0.0–0.2)

## 2019-01-23 LAB — RAPID URINE DRUG SCREEN, HOSP PERFORMED
Amphetamines: NOT DETECTED
Barbiturates: NOT DETECTED
Benzodiazepines: NOT DETECTED
Cocaine: NOT DETECTED
Opiates: NOT DETECTED
Tetrahydrocannabinol: POSITIVE — AB

## 2019-01-23 LAB — CBG MONITORING, ED: Glucose-Capillary: 131 mg/dL — ABNORMAL HIGH (ref 70–99)

## 2019-01-23 LAB — COMPREHENSIVE METABOLIC PANEL
ALT: 25 U/L (ref 0–44)
AST: 35 U/L (ref 15–41)
Albumin: 4.5 g/dL (ref 3.5–5.0)
Alkaline Phosphatase: 65 U/L (ref 38–126)
Anion gap: 12 (ref 5–15)
BUN: 11 mg/dL (ref 6–20)
CO2: 22 mmol/L (ref 22–32)
Calcium: 9.4 mg/dL (ref 8.9–10.3)
Chloride: 104 mmol/L (ref 98–111)
Creatinine, Ser: 0.97 mg/dL (ref 0.61–1.24)
GFR calc Af Amer: >60 mL/min (ref >60)
GFR calc non Af Amer: >60 mL/min (ref >60)
Glucose, Bld: 117 mg/dL — ABNORMAL HIGH (ref 70–99)
Potassium: 4.2 mmol/L (ref 3.5–5.1)
Sodium: 138 mmol/L (ref 135–145)
Total Bilirubin: 0.7 mg/dL (ref 0.3–1.2)
Total Protein: 8.2 g/dL — ABNORMAL HIGH (ref 6.5–8.1)

## 2019-01-23 LAB — BRAIN NATRIURETIC PEPTIDE: B Natriuretic Peptide: 50 pg/mL (ref 0.0–100.0)

## 2019-01-23 LAB — SARS CORONAVIRUS 2 BY RT PCR (HOSPITAL ORDER, PERFORMED IN ~~LOC~~ HOSPITAL LAB): SARS Coronavirus 2: NEGATIVE

## 2019-01-23 MED ORDER — ACETAMINOPHEN 325 MG PO TABS
650.0000 mg | ORAL_TABLET | Freq: Four times a day (QID) | ORAL | Status: DC | PRN
  Administered 2019-01-24: 650 mg via ORAL
  Filled 2019-01-23: qty 2

## 2019-01-23 MED ORDER — PANTOPRAZOLE SODIUM 40 MG PO TBEC
40.0000 mg | DELAYED_RELEASE_TABLET | Freq: Two times a day (BID) | ORAL | Status: DC
  Administered 2019-01-23 – 2019-01-24 (×2): 40 mg via ORAL
  Filled 2019-01-23 (×2): qty 1

## 2019-01-23 MED ORDER — HYDROCHLOROTHIAZIDE 10 MG/ML ORAL SUSPENSION: 6.2500 mg | Freq: Every day | ORAL | Status: DC

## 2019-01-23 MED ORDER — ASPIRIN 81 MG PO CHEW
324.0000 mg | CHEWABLE_TABLET | Freq: Once | ORAL | Status: AC
  Administered 2019-01-23: 07:00:00 324 mg via ORAL
  Filled 2019-01-23: qty 4

## 2019-01-23 MED ORDER — HYDROCHLOROTHIAZIDE 10 MG/ML ORAL SUSPENSION
6.2500 mg | Freq: Every day | ORAL | Status: DC
  Filled 2019-01-23 (×2): qty 1.25

## 2019-01-23 MED ORDER — BISOPROLOL FUMARATE 5 MG PO TABS
5.0000 mg | ORAL_TABLET | Freq: Every day | ORAL | Status: DC
  Administered 2019-01-23 – 2019-01-24 (×2): 5 mg via ORAL
  Filled 2019-01-23 (×4): qty 1

## 2019-01-23 MED ORDER — HYDRALAZINE HCL 20 MG/ML IJ SOLN
10.0000 mg | Freq: Four times a day (QID) | INTRAMUSCULAR | Status: DC | PRN
  Administered 2019-01-23 (×2): 10 mg via INTRAVENOUS
  Filled 2019-01-23 (×2): qty 1

## 2019-01-23 MED ORDER — ROSUVASTATIN CALCIUM 10 MG PO TABS
10.0000 mg | ORAL_TABLET | Freq: Every day | ORAL | Status: DC
  Administered 2019-01-23 – 2019-01-24 (×2): 10 mg via ORAL
  Filled 2019-01-23 (×2): qty 1

## 2019-01-23 MED ORDER — BISOPROLOL-HYDROCHLOROTHIAZIDE 5-6.25 MG PO TABS: 1.0000 | ORAL_TABLET | Freq: Every day | ORAL | Status: DC

## 2019-01-23 MED ORDER — HYDROCHLOROTHIAZIDE 12.5 MG PO TABS
6.2500 mg | ORAL_TABLET | Freq: Every day | ORAL | Status: DC
  Administered 2019-01-23 – 2019-01-24 (×2): 6.25 mg via ORAL
  Filled 2019-01-23 (×5): qty 1

## 2019-01-23 MED ORDER — ACETAMINOPHEN 650 MG RE SUPP: 650.0000 mg | Freq: Four times a day (QID) | RECTAL | Status: DC | PRN

## 2019-01-23 MED ORDER — BISOPROLOL FUMARATE 5 MG PO TABS: 5.0000 mg | ORAL_TABLET | Freq: Every day | ORAL | Status: DC

## 2019-01-23 MED ORDER — NICOTINE 7 MG/24HR TD PT24
7.0000 mg | MEDICATED_PATCH | Freq: Every day | TRANSDERMAL | Status: DC
  Administered 2019-01-23 – 2019-01-24 (×2): 7 mg via TRANSDERMAL
  Filled 2019-01-23 (×2): qty 1

## 2019-01-23 NOTE — ED Notes (Signed)
Patient on 12 lead at this time. EKG done and seen by Dr Rhunette Croft.

## 2019-01-23 NOTE — Clinical Social Work Note (Signed)
Pt requests letter for work at d/c.  States he works at [employer].

## 2019-01-23 NOTE — ED Provider Notes (Signed)
[facility] EMERGENCY DEPARTMENT Provider Note   CSN: [id number] Arrival date & time: 01/23/19  0549    History   Chief Complaint Chief Complaint  Patient presents with   Shortness of Breath    HPI [name] is a 58 y.o. male.  58 year old male comes in with multiple complaints. Patient reports that his been feeling unwell for the last 2 weeks.  He has been having episodes of substernal chest pain, details of which are below.  Additionally he has been having headaches, dizziness, nausea, diarrhea, body aches, cough, shortness of breath and weakness.  Patient's cough is productive, and he has a white phlegm coming up with it.  He states that his shortness of breath is exertional, and he appreciates it typically at work.  He also has had dizziness, described as feeling drunk.  He had a stroke in 2013 with dizziness and nausea, and although the symptoms are not as severe, he is concerned that his symptoms are related to stroke.  Patient denies any focal numbness, weakness, vision change, speech difficulty.  He also denies any fevers, sick contacts.  HPI: A 58 year old patient with a history of CVA, hypertension and hypercholesterolemia presents for evaluation of chest pain. Initial onset of pain was approximately 1-3 hours ago. [name] is described as heaviness/pressure/tightness and is worse with exertion. [name] is middle- or left-sided, is not well-localized, is not sharp and does not radiate to the arms/jaw/neck. The patient does not complain of nausea and denies diaphoresis. The patient has smoked in the past 90 days. The patient has no history of peripheral artery disease, denies any history of treated diabetes, has no relevant family history of coronary artery disease (first degree relative at less than age 6) and does not have an elevated BMI (>=30).   Patient states that he has stopped using cocaine since 2016.  He continues to smoke cigarettes  however.  He has been taking his blood pressure medicine, but has been noncompliant with cholesterol and aspirin.  HPI  Past Medical History:  Diagnosis Date   Anginal pain (HCC)    Hepatitis B 1991   History of alcohol abuse    History of cocaine abuse (HCC)    Hypercholesterolemia    Hypertension    Mood disorder (HCC)    NOS. Admitted to [facility] for detox in 12/2005   Stroke [facility]) 08/05/2012   denies residual (08/05/2012)   Tobacco abuse     Patient Active Problem List   Diagnosis Date Noted   Chest pain 01/23/2019   Stroke (HCC) 08/05/2012   Hypertension 08/05/2012   Tobacco abuse 08/05/2012   History of cocaine abuse (HCC)    History of alcohol abuse     Past Surgical History:  Procedure Laterality Date   MOUTH SURGERY  2011   "had to cut tooth out of my gum" (08/05/2012)   TEE WITHOUT CARDIOVERSION  08/07/2012   Procedure: TRANSESOPHAGEAL ECHOCARDIOGRAM (TEE);  Surgeon: Vesta Mixer, MD;  Location: [facility] ENDOSCOPY;  Service: Cardiovascular;  Laterality: N/A;        Home Medications    Prior to Admission medications   Medication Sig Start Date End Date Taking? Authorizing Provider  bisoprolol-hydrochlorothiazide (ZIAC) 5-6.25 MG tablet Take 1 tablet by mouth daily. 04/16/17   Doug Sou, MD  lisinopril-hydrochlorothiazide (PRINZIDE,ZESTORETIC) 20-12.5 MG per tablet Take 1 tablet by mouth daily.  04/01/14  [provider]    Family History Family History  Problem Relation Age  of Onset   Hypertension Mother    Osteoarthritis Mother    Hyperlipidemia Mother    Prostate cancer Father    CAD Maternal Grandmother    Lung cancer Paternal Grandfather        tobacco abuse    Social History Social History   Tobacco Use   Smoking status: Current Every Day Smoker    Packs/day: 1.50    Years: 35.00    Pack years: 52.50    Types: Cigarettes   Smokeless tobacco: Current User    Types: Snuff  Substance Use Topics   Alcohol  use: Yes    Alcohol/week: 2.0 standard drinks    Types: 2 Cans of beer per week    Comment: 1-2 beers a week / previously drank heavily until 2009 6 pack or more of beer daily.   Drug use: Yes    Types: Marijuana, Heroin    Comment: stopped in [facility], smoked cocaine, some heroin (quit in 2011); last UDS 03/2012 + for cocaine/resident note 08/05/2012     Allergies   Lisinopril and Doxycycline   Review of Systems Review of Systems  Constitutional: Positive for activity change.  Respiratory: Positive for cough, chest tightness and shortness of breath.   Cardiovascular: Positive for chest pain.  Gastrointestinal: Positive for diarrhea, nausea and vomiting.  Genitourinary: Negative for dysuria.  Musculoskeletal: Positive for myalgias.  Neurological: Positive for dizziness, light-headedness and headaches. Negative for syncope, speech difficulty, weakness and numbness.  All other systems reviewed and are negative.    Physical Exam Updated Vital Signs BP (!) 203/116    Pulse 66    Temp 98.2 F (36.8 C) (Oral)    Resp 17    Ht  (1.702 m)    Wt 83.9 kg    SpO2 98%    BMI 28.98 kg/m   Physical Exam Vitals signs and nursing note reviewed.  Constitutional:      Appearance: He is well-developed.  HENT:     Head: Atraumatic.  Neck:     Musculoskeletal: Neck supple.  Cardiovascular:     Rate and Rhythm: Normal rate.     Pulses: Normal pulses.  Pulmonary:     Effort: Pulmonary effort is normal.     Breath sounds: No decreased breath sounds, wheezing, rhonchi or rales.  Musculoskeletal:     Right lower leg: No edema.     Left lower leg: No edema.  Skin:    General: Skin is warm.  Neurological:     General: No focal deficit present.     Mental Status: He is alert and oriented to person, place, and time.     Cranial Nerves: No cranial nerve deficit.     Motor: No weakness.     Comments: Cerebellar exam is normal (finger to nose) Sensory exam normal for bilateral upper and  lower extremities - and patient is able to discriminate between sharp and dull. Motor exam is 4+/5       ED Treatments / Results  Labs (all labs ordered are listed, but only abnormal results are displayed) Labs Reviewed  COMPREHENSIVE METABOLIC PANEL - Abnormal; Notable for the following components:      Result Value   Glucose, Bld 117 (*)    Total Protein 8.2 (*)    All other components within normal limits  CBG MONITORING, ED - Abnormal; Notable for the following components:   Glucose-Capillary 131 (*)    All other components within normal limits  SARS CORONAVIRUS 2 (  HOSPITAL ORDER, PERFORMED IN [facility])  TROPONIN I  CBC WITH DIFFERENTIAL/PLATELET  BRAIN NATRIURETIC PEPTIDE  TROPONIN I  URINALYSIS, ROUTINE W REFLEX MICROSCOPIC  RAPID URINE DRUG SCREEN, HOSP PERFORMED    EKG EKG Interpretation  Date/Time:  Thursday Jan 23 2019 05:59:50 EDT Ventricular Rate:  60 PR Interval:    QRS Duration: 148 QT Interval:  453 QTC Calculation: 453 R Axis:   -2 Text Interpretation:  Sinus rhythm Atrial premature complex Right bundle branch block No acute changes No significant change since last tracing Confirmed by Derwood Kaplan ([id number]) on 01/23/2019 6:08:12 AM   Radiology Dg Chest 2 View  Result Date: 01/23/2019 CLINICAL DATA:  58 year old male with intermittent cough dizziness shortness of breath nausea and diarrhea. EXAM: CHEST - 2 VIEW COMPARISON:  05/07/2015 FINDINGS: Mild cardiomegaly which has increased since 2016. Other mediastinal contours are within normal limits. Visualized tracheal air column is within normal limits. Lung volumes are mildly larger, now at the upper limits of normal. No pneumothorax, pulmonary edema, pleural effusion or confluent pulmonary opacity. Negative visible bowel gas pattern. No acute osseous abnormality identified. IMPRESSION: 1.  No acute cardiopulmonary abnormality. 2. Suspect development of mild cardiomegaly and pulmonary  hyperinflation since 2016. Electronically Signed   By: Odessa Fleming M.D.   On: 01/23/2019 07:16   Ct Head Wo Contrast  Result Date: 01/23/2019 CLINICAL DATA:  58 year old male with intermittent dizziness, headache, cough shortness of breath and diarrhea for 2 weeks. EXAM: CT HEAD WITHOUT CONTRAST TECHNIQUE: Contiguous axial images were obtained from the base of the skull through the vertex without intravenous contrast. COMPARISON:  Brain MRI 04/16/2017 and head CT 11/12/2015. FINDINGS: Brain: No midline shift, mass effect, or evidence of intracranial mass lesion. No ventriculomegaly. No acute intracranial hemorrhage identified. 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 Patchy and confluent bilateral cerebral white matter hypodensity is similar to that on the 2018 MRI. Chronic involvement of the right basal ganglia on series 2, image 13 is stable. No cortical encephalomalacia identified. No cortically based acute infarct identified. Vascular: Calcified atherosclerosis at the skull base. No suspicious intracranial vascular hyperdensity. Skull: Negative. Sinuses/Orbits: Visualized paranasal sinuses and mastoids are stable and well pneumatized. Other: Visualized orbits and scalp soft tissues are within normal limits. IMPRESSION: 1. No acute intracranial abnormality. 2. Chronic nonspecific white matter disease, and chronic cerebellar ischemic disease appear stable since 2018. Electronically Signed   By: Odessa Fleming M.D.   On: 01/23/2019 07:08    Procedures Procedures (including critical care time)  Medications Ordered in ED Medications  bisoprolol-hydrochlorothiazide (ZIAC) 5-6.25 MG per tablet 1 tablet (has no administration in time range)  aspirin chewable tablet 324 mg (324 mg Oral Given 01/23/19 0710)     Initial Impression / Assessment and Plan / ED Course  I have reviewed the triage vital signs and the nursing notes.  Pertinent labs & imaging results that were available during my  care of the patient were reviewed by me and considered in my medical decision making (see chart for details).     HEAR Score: 5  Differential diagnosis includes: ACS syndrome Aortic dissection CHF exacerbation Valvular disorder Myocarditis Pericarditis Endocarditis Pericardial effusion / tamponade Pneumonia PUD / Gastritis / Esophagitis Esophageal spasm TIA   Patient comes in with multiple complaints.  His primary complaint is chest discomfort and shortness of breath that is been going on intermittently for the last 2 weeks.  Chest pain is exertional.  He also has  been having headaches along with dizziness, with previous history of stroke that was posterior nature.  At this time he has no headaches, chest pain.  His BP is significantly elevated.  Patient has history of heavy smoking and prior history of cocaine use.  No known history of CAD.  Patient is hear score is 5.  He had chest pain episode at 430 when he woke up.  He will need to come in for ACS rule out. Additionally, there is a possibility of TIA versus hypertensive emergency as the cause for his headache and dizziness.  I consider dissection in the differential diagnosis, however patient has bilateral equal radial pulse with BP in the left upper extremity being in the 180s and in the right upper extremity being in the 170s.  Currently patient is completely asymptomatic.  He does not need acute dissection work-up, but if he starts having severe symptoms involving chest pain and neurologic deficits then he might need a CT dissection study.  I discussed the case with Dr. Butler Denmarkizwan.  She is requesting that we put in an admission request.  She will work-up TIA herself.   Final Clinical Impressions(s) / ED Diagnoses   Final diagnoses:  Precordial chest pain  Hypertensive urgency  Dizziness    ED Discharge Orders    None       Derwood KaplanNanavati, Aasir Daigler, MD 01/23/19 [phone]

## 2019-01-23 NOTE — ED Notes (Signed)
Patient transported to CT and X ray 

## 2019-01-23 NOTE — H&P (Signed)
History and Physical    [name]  PVV:[id number]  DOB: 1961/02/13  DOA: 01/23/2019 PCP: [facility]   Patient coming from: home  Chief Complaint: chest pain  HPI: [name] is a 58 y.o. male with medical history of HTN, tobacco abuse and a CVA in 2013, HLD who presents for chest discomfort which he noted while getting ready to go to work this AM, which he describes as a "grabbing pain" in the mid chest which would come and go which was a 7/10 when severe. He had trouble breathing at the same time, felt lightheaded and dizzy. His pain went away by the time he arrived to the ED. He states that over the past few weeks he had been feeling nauseated and having diarrhea for about 2 wks now. He has had pain in his upper abdomen as well. He appetite comes and goes but he has not had any weight loss.  He feels that spicy food causes more GI symptoms. He used to drink coffee every morning but has not been able to for the past month as it makes his stomach "sour". He admits to feeling that his food gets stuck in his chest sometimes but has not had any regurgitation. He has had loose stools, up to 4 a day. No blood noted in stool. He takes a 325 mg of aspirin every day since his stroke but does not take Ibuprofen or other NSAIDs regulary. He drinks a beer or two a day after work and occasionally drinks more that that. He takes his medications appropriately but has not gotten a refill on his cholesterol medication recently.     ED Course: BP quite high 203/116, 192/95- HR in 50s and 60s  Review of Systems:  Occasional knee pain.  All other systems reviewed and apart from HPI, are negative.  Past Medical History:  Diagnosis Date   Anginal pain (HCC)    Hepatitis B 1991   History of alcohol abuse    History of cocaine abuse (HCC)    Hypercholesterolemia    Hypertension    Mood disorder (HCC)    NOS. Admitted to [facility] for detox in 12/2005   Stroke  [facility]) 08/05/2012   denies residual (08/05/2012)   Tobacco abuse     Past Surgical History:  Procedure Laterality Date   MOUTH SURGERY  2011   "had to cut tooth out of my gum" (08/05/2012)   TEE WITHOUT CARDIOVERSION  08/07/2012   Procedure: TRANSESOPHAGEAL ECHOCARDIOGRAM (TEE);  Surgeon: Vesta Mixer, MD;  Location: [facility] ENDOSCOPY;  Service: Cardiovascular;  Laterality: N/A;    Social History:  Reports that he has been smoking 1/2 to 1 ppd of cigarettes.   He drinks 1-2 beers a day. He reports current drug use. Drugs: Marijuana occasionally.   Allergies  Allergen Reactions   Lisinopril Swelling   Doxycycline     Family History  Problem Relation Age of Onset   Hypertension Mother    Osteoarthritis Mother    Hyperlipidemia Mother    Prostate cancer Father    CAD Maternal Grandmother    Lung cancer Paternal Grandfather        tobacco abuse     Prior to Admission medications   Medication Sig Start Date End Date Taking? Authorizing Provider  bisoprolol-hydrochlorothiazide (ZIAC) 5-6.25 MG tablet Take 1 tablet by mouth daily. 04/16/17   Doug Sou, MD  lisinopril-hydrochlorothiazide (PRINZIDE,ZESTORETIC) 20-12.5 MG per tablet Take 1 tablet by mouth daily.  04/01/14  [provider]    Physical Exam: Wt Readings from Last 3 Encounters:  01/23/19 83.7 kg  04/16/17 83.9 kg  11/12/15 83.9 kg   Vitals:   01/23/19 0830 01/23/19 0845 01/23/19 0900 01/23/19 1048  BP: (!) 187/103  (!) 198/108 (!) 192/95  Pulse: (!) 48 (!) 51 (!) 49 (!) 50  Resp: Temp:    98.3 F (36.8 C)  TempSrc:    Oral  SpO2: 98% 98% 99% 100%  Weight:    83.7 kg  Height:     (1.702 m)      Constitutional:  Calm & comfortable Eyes: PERRLA, lids and conjunctivae normal ENT:  Mucous membranes are moist.  Pharynx clear of exudate   Normal dentition.  Neck: Supple, no masses  Respiratory:  Clear to auscultation bilaterally  Normal respiratory effort.    Cardiovascular:  S1 & S2 heard, regular rate and rhythm No Murmurs Abdomen:  Non distended No tenderness, No masses Bowel sounds normal Extremities:  No clubbing / cyanosis No pedal edema No joint deformity    Skin:  No rashes, lesions or ulcers Neurologic:  AAO x 3 CN 2-12 grossly intact Sensation intact Strength 5/5 in all 4 extremities Psychiatric:  Normal Mood and affect    Labs on Admission: I have personally reviewed following labs and imaging studies  CBC: Recent Labs  Lab 01/23/19 0616  WBC 7.6  NEUTROABS 3.8  HGB 15.6  HCT 45.9  MCV 96.6  PLT 244   Basic Metabolic Panel: Recent Labs  Lab 01/23/19 0616  NA 138  K 4.2  CL 104  CO2 22  GLUCOSE 117*  BUN 11  CREATININE 0.97  CALCIUM 9.4   GFR: Estimated Creatinine Clearance: 87.9 mL/min (by C-G formula based on SCr of 0.97 mg/dL). Liver Function Tests: Recent Labs  Lab 01/23/19 0616  AST 35  ALT 25  ALKPHOS 65  BILITOT 0.7  PROT 8.2*  ALBUMIN 4.5   No results for input(s): LIPASE, AMYLASE in the last 168 hours. No results for input(s): AMMONIA in the last 168 hours. Coagulation Profile: No results for input(s): INR, PROTIME in the last 168 hours. Cardiac Enzymes: Recent Labs  Lab 01/23/19 0616 01/23/19 0908  TROPONINI <0.03 0.03*   BNP (last 3 results) No results for input(s): PROBNP in the last 8760 hours. HbA1C: No results for input(s): HGBA1C in the last 72 hours. CBG: Recent Labs  Lab 01/23/19 0622  GLUCAP 131*   Lipid Profile: No results for input(s): CHOL, HDL, LDLCALC, TRIG, CHOLHDL, LDLDIRECT in the last 72 hours. Thyroid Function Tests: No results for input(s): TSH, T4TOTAL, FREET4, T3FREE, THYROIDAB in the last 72 hours. Anemia Panel: No results for input(s): VITAMINB12, FOLATE, FERRITIN, TIBC, IRON, RETICCTPCT in the last 72 hours. Urine analysis:    Component Value Date/Time   COLORURINE YELLOW 01/23/2019 0620   APPEARANCEUR CLEAR 01/23/2019 0620    LABSPEC 1.008 01/23/2019 0620   PHURINE 6.0 01/23/2019 0620   GLUCOSEU NEGATIVE 01/23/2019 0620   HGBUR NEGATIVE 01/23/2019 0620   BILIRUBINUR NEGATIVE 01/23/2019 0620   KETONESUR NEGATIVE 01/23/2019 0620   PROTEINUR NEGATIVE 01/23/2019 0620   UROBILINOGEN 0.2 08/05/2012 1053   NITRITE NEGATIVE 01/23/2019 0620   LEUKOCYTESUR NEGATIVE 01/23/2019 0620   Sepsis Labs: (procalcitonin:4,lacticidven:4) ) Recent Results (from the past 240 hour(s))  SARS Coronavirus 2 (CEPHEID - Performed in [facility] hospital lab), Hosp Order     Status: None   Collection Time: 01/23/19  6:17 AM  Result Value Ref Range Status   SARS Coronavirus 2 NEGATIVE NEGATIVE Final    Comment: (NOTE) If result is NEGATIVE SARS-CoV-2 target nucleic acids are NOT DETECTED. The SARS-CoV-2 RNA is generally detectable in upper and lower  respiratory specimens during the acute phase of infection. The lowest  concentration of SARS-CoV-2 viral copies this assay can detect is 250  copies / mL. A negative result does not preclude SARS-CoV-2 infection  and should not be used as the sole basis for treatment or other  patient management decisions.  A negative result may occur with  improper specimen collection / handling, submission of specimen other  than nasopharyngeal swab, presence of viral mutation(s) within the  areas targeted by this assay, and inadequate number of viral copies  (<250 copies / mL). A negative result must be combined with clinical  observations, patient history, and epidemiological information. If result is POSITIVE SARS-CoV-2 target nucleic acids are DETECTED. The SARS-CoV-2 RNA is generally detectable in upper and lower  respiratory specimens dur ing the acute phase of infection.  Positive  results are indicative of active infection with SARS-CoV-2.  Clinical  correlation with patient history and other diagnostic information is  necessary to determine patient infection status.  Positive  results do  not rule out bacterial infection or co-infection with other viruses. If result is PRESUMPTIVE POSTIVE SARS-CoV-2 nucleic acids MAY BE PRESENT.   A presumptive positive result was obtained on the submitted specimen  and confirmed on repeat testing.  While 2019 novel coronavirus  (SARS-CoV-2) nucleic acids may be present in the submitted sample  additional confirmatory testing may be necessary for epidemiological  and / or clinical management purposes  to differentiate between  SARS-CoV-2 and other Sarbecovirus currently known to infect humans.  If clinically indicated additional testing with an alternate test  methodology [phone]) is advised. The SARS-CoV-2 RNA is generally  detectable in upper and lower respiratory sp ecimens during the acute  phase of infection. The expected result is Negative. Fact Sheet for Patients:  BoilerBrush.com.cy Fact Sheet for Healthcare Providers: https://pope.com/ This test is not yet approved or cleared by the [place] FDA and has been authorized for detection and/or diagnosis of SARS-CoV-2 by FDA under an Emergency Use Authorization (EUA).  This EUA will remain in effect (meaning this test can be used) for the duration of the COVID-19 declaration under Section 564(b)(1) of the Act, 21 U.S.C. section 360bbb-3(b)(1), unless the authorization is terminated or revoked sooner. Performed at [facility], [address], [place], [place] [address]      Radiological Exams on Admission: Dg Chest 2 View  Result Date: 01/23/2019 CLINICAL DATA:  58 year old male with intermittent cough dizziness shortness of breath nausea and diarrhea. EXAM: CHEST - 2 VIEW COMPARISON:  05/07/2015 FINDINGS: Mild cardiomegaly which has increased since 2016. Other mediastinal contours are within normal limits. Visualized tracheal air column is within normal limits. Lung volumes are mildly larger, now at the upper limits  of normal. No pneumothorax, pulmonary edema, pleural effusion or confluent pulmonary opacity. Negative visible bowel gas pattern. No acute osseous abnormality identified. IMPRESSION: 1.  No acute cardiopulmonary abnormality. 2. Suspect development of mild cardiomegaly and pulmonary hyperinflation since 2016. Electronically Signed   By: Odessa Fleming M.D.   On: 01/23/2019 07:16   Ct Head Wo Contrast  Result Date: 01/23/2019 CLINICAL DATA:  58 year old male with intermittent dizziness, headache, cough shortness of breath and diarrhea for 2 weeks. EXAM: CT HEAD WITHOUT CONTRAST TECHNIQUE:  Contiguous axial images were obtained from the base of the skull through the vertex without intravenous contrast. COMPARISON:  Brain MRI 04/16/2017 and head CT 11/12/2015. FINDINGS: Brain: No midline shift, mass effect, or evidence of intracranial mass lesion. No ventriculomegaly. No acute intracranial hemorrhage identified. 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 Patchy and confluent bilateral cerebral white matter hypodensity is similar to that on the 2018 MRI. Chronic involvement of the right basal ganglia on series 2, image 13 is stable. No cortical encephalomalacia identified. No cortically based acute infarct identified. Vascular: Calcified atherosclerosis at the skull base. No suspicious intracranial vascular hyperdensity. Skull: Negative. Sinuses/Orbits: Visualized paranasal sinuses and mastoids are stable and well pneumatized. Other: Visualized orbits and scalp soft tissues are within normal limits. IMPRESSION: 1. No acute intracranial abnormality. 2. Chronic nonspecific white matter disease, and chronic cerebellar ischemic disease appear stable since 2018. Electronically Signed   By: Odessa FlemingH  Hall M.D.   On: 01/23/2019 07:08    EKG: Independently reviewed. Sinus Rhythm with RBBB  Assessment/Plan Principal Problem:   Chest pain - non acute EKG- pain was coming and going and felt "grabbing" ? If he  was having esophageal spasms related to his other GI symptoms - will check 3 sets of troponin- due to his high risk for MI (h/o CVA, smoking, hypertensin and HLD) I think it would be reasonable to get a stress test tomorrow  Active Problems:    Hypertensive urgency - cont HCTZ and Bisoprolol  - will add Hydralazine IV today and follow BP    Abdominal pain/ nausea, poor appetite and diarrhea - ? Gastritis or GERD- if hehas GERD, it may have caused above chest pain - will start Protonix BID - we have had a long discussion about the type of foods to avoid and to avoid alcohol completely - if symptoms do not improve, he will need further work up by GI     Tobacco abuse - I have advised him to stop smoking considering the fact that he has already had a CVA and is a high risk for further atherosclerotic disease - he would like a nicotine patch which I have prescribed    H/O: CVA (cerebrovascular accident)  - I have resumed the Crestor that he has not gotten refilled- cont ASA 325 mg   DVT prophylaxis:   SCDS  Code Status: Full code  Disposition Plan: likely home tomorrow if normal stress test Consults called: cardiology   Admission status: observation    Calvert CantorSaima Xylah Early MD [facility] Pager: [email or web address] Password [email or web address] [phone], please contact night-coverage   01/23/2019, 1:15 PM

## 2019-01-23 NOTE — ED Triage Notes (Addendum)
Pt reports intermittent periods of SOB, cough, nausea, dizziness and diarrhea x 2 weeks

## 2019-01-24 ENCOUNTER — Observation Stay (HOSPITAL_BASED_OUTPATIENT_CLINIC_OR_DEPARTMENT_OTHER): Payer: BLUE CROSS/BLUE SHIELD

## 2019-01-24 ENCOUNTER — Other Ambulatory Visit: Payer: Self-pay | Admitting: Physician Assistant

## 2019-01-24 DIAGNOSIS — R072 Precordial pain: Secondary | ICD-10-CM | POA: Diagnosis not present

## 2019-01-24 DIAGNOSIS — I160 Hypertensive urgency: Secondary | ICD-10-CM | POA: Diagnosis not present

## 2019-01-24 DIAGNOSIS — R079 Chest pain, unspecified: Secondary | ICD-10-CM

## 2019-01-24 DIAGNOSIS — E785 Hyperlipidemia, unspecified: Secondary | ICD-10-CM

## 2019-01-24 DIAGNOSIS — Z720 Tobacco use: Secondary | ICD-10-CM | POA: Diagnosis not present

## 2019-01-24 DIAGNOSIS — Z8673 Personal history of transient ischemic attack (TIA), and cerebral infarction without residual deficits: Secondary | ICD-10-CM | POA: Diagnosis not present

## 2019-01-24 LAB — ECHOCARDIOGRAM COMPLETE
Height: 67 in
Weight: 2952 oz

## 2019-01-24 LAB — HIV ANTIBODY (ROUTINE TESTING W REFLEX): HIV Screen 4th Generation wRfx: NONREACTIVE

## 2019-01-24 MED ORDER — AMLODIPINE BESYLATE 10 MG PO TABS: 10.0000 mg | ORAL_TABLET | Freq: Every day | ORAL | 0 refills | Status: DC

## 2019-01-24 MED ORDER — AMLODIPINE BESYLATE 5 MG PO TABS
10.0000 mg | ORAL_TABLET | Freq: Every day | ORAL | Status: DC
  Administered 2019-01-24: 10 mg via ORAL
  Filled 2019-01-24: qty 2

## 2019-01-24 MED ORDER — BISOPROLOL-HYDROCHLOROTHIAZIDE 5-6.25 MG PO TABS: 1.0000 | ORAL_TABLET | Freq: Every day | ORAL | 0 refills | Status: DC

## 2019-01-24 MED ORDER — ROSUVASTATIN CALCIUM 10 MG PO TABS: 10.0000 mg | ORAL_TABLET | Freq: Every day | ORAL | 0 refills | Status: DC

## 2019-01-24 MED ORDER — PANTOPRAZOLE SODIUM 40 MG PO TBEC: 40.0000 mg | DELAYED_RELEASE_TABLET | Freq: Two times a day (BID) | ORAL | 0 refills | Status: DC

## 2019-01-24 NOTE — Progress Notes (Signed)
*  PRELIMINARY RESULTS* Echocardiogram 2D Echocardiogram has been performed.  Stacey Drain 01/24/2019, 12:22 PM

## 2019-01-24 NOTE — Discharge Summary (Signed)
Physician Discharge Summary  [name] ZOX:[id number] DOB: [birth year] DOA: 01/23/2019  PCP: [facility]  Admit date: 01/23/2019 Discharge date: 01/24/2019  Admitted From: home Disposition:  home   Recommendations for Outpatient Follow-up:  1. Needs to f/u with cardiology as outpt  Home Health:  none  Equipment/Devices:  none    Discharge Condition:  stable   CODE STATUS:  Full code   Diet recommendation:  Heart healthy, bland Consultations:  cardiology    Discharge Diagnoses:  Principal Problem:   Chest pain Active Problems:    Abdominal pain   Hypertensive urgency   H/O: CVA (cerebrovascular accident)   Tobacco abuse      Brief Summary: [name] is a 58 y.o. male with medical history of HTN, tobacco abuse and a CVA in 2013, HLD who presents for chest discomfort which he noted while getting ready to go to work this AM, which he describes as a "grabbing pain" in the mid chest which would come and go which was a 7/10 when severe. He had trouble breathing at the same time, felt lightheaded and dizzy. His pain went away by the time he arrived to the ED. He states that over the past few weeks he had been feeling nauseated and having diarrhea for about 2 wks now. He has had pain in his upper abdomen as well. He appetite comes and goes but he has not had any weight loss.  He feels that spicy food causes more GI symptoms. He used to drink coffee every morning but has not been able to for the past month as it makes his stomach "sour". He admits to feeling that his food gets stuck in his chest sometimes but has not had any regurgitation. He has had loose stools, up to 4 a day. No blood noted in stool. He takes a 325 mg of aspirin every day since his stroke but does not take Ibuprofen or other NSAIDs regulary. He drinks a beer or two a day after work and occasionally drinks more that that. He takes his medications appropriately but has not gotten a  refill on his cholesterol medication recently.    Hospital Course:  Principal Problem:   Chest pain - non acute EKG- pain was coming and going and felt "grabbing" ? If he was having esophageal spasms related to his other GI symptoms -   3 sets of troponin negative- 2 D ECHO unrevealing- see report below- cardiology has been consulted and recommends outpt CT coronaries which they will arrange  Active Problems:    Hypertensive urgency - cont HCTZ and Bisoprolol  - added Amlodipine- advised to check BP BID and keep record to show PCP next week    Abdominal pain/ nausea, poor appetite and diarrhea - ? Gastritis or GERD- if hehas GERD, it may have caused above chest pain - will start Protonix BID - we have had a long discussion about the type of foods to avoid and to avoid alcohol completely - if symptoms do not improve, he will need further work up by GI     Tobacco abuse - I have advised him to stop smoking considering the fact that he has already had a CVA and is a high risk for further atherosclerotic disease     H/O: CVA (cerebrovascular accident)  - I have resumed the Crestor that he has not gotten refilled- cont ASA 325 mg   Discharge Exam: Vitals:   01/23/19 2341 01/24/19 0500  BP: Marland Kitchen)  168/84 (!) 163/97  Pulse: 62 64  Resp:  17  Temp:  98.6 F (37 C)  SpO2:  99%   Vitals:   01/23/19 1321 01/23/19 2126 01/23/19 2341 01/24/19 0500  BP: (!) 173/97 (!) 185/109 (!) 168/84 (!) 163/97  Pulse: (!) 53 (!) 57 62 64  Resp: 16 18  17   Temp: 98.3 F (36.8 C) 99 F (37.2 C)  98.6 F (37 C)  TempSrc: Oral Oral  Oral  SpO2: 99% 97%  99%  Weight:      Height:        General: Pt is alert, awake, not in acute distress Cardiovascular: RRR, S1/S2 +, no rubs, no gallops Respiratory: CTA bilaterally, no wheezing, no rhonchi Abdominal: Soft, NT, ND, bowel sounds + Extremities: no edema, no cyanosis   Discharge Instructions  Discharge Instructions    Diet - low sodium  heart healthy   Complete by:  As directed    Increase activity slowly   Complete by:  As directed      Allergies as of 01/24/2019      Reactions   Lisinopril Swelling   Doxycycline       Medication List    TAKE these medications   amLODipine 10 MG tablet Commonly known as:  NORVASC Take 1 tablet (10 mg total) by mouth daily. Start taking on:  Jan 25, 2019   aspirin 325 MG tablet Take 325 mg by mouth daily.   bisoprolol-hydrochlorothiazide 5-6.25 MG tablet Commonly known as:  ZIAC Take 1 tablet by mouth daily.   pantoprazole 40 MG tablet Commonly known as:  PROTONIX Take 1 tablet (40 mg total) by mouth 2 (two) times daily before a meal.   rosuvastatin 10 MG tablet Commonly known as:  CRESTOR Take 1 tablet (10 mg total) by mouth daily. Start taking on:  Jan 25, 2019       Allergies  Allergen Reactions  . Lisinopril Swelling  . Doxycycline      Procedures/Studies: 2 D ECHO  1. The left ventricle has normal systolic function, with an ejection fraction of 55-60%. The cavity size was normal. There is mild concentric left ventricular hypertrophy. Left ventricular diastolic Doppler parameters are consistent with impaired  relaxation. No evidence of left ventricular regional wall motion abnormalities.  2. The right ventricle has normal systolic function. The cavity was normal. There is no increase in right ventricular wall thickness.  3. Right atrial size was mildly dilated.  4. The mitral valve is grossly normal.  5. The tricuspid valve is grossly normal.  6. The aortic valve is tricuspid. Mild thickening of the aortic valve. Mild calcification of the aortic valve.  7. The aortic root is normal in size and structure.   Dg Chest 2 View  Result Date: 01/23/2019 CLINICAL DATA:  58 year old male with intermittent cough dizziness shortness of breath nausea and diarrhea. EXAM: CHEST - 2 VIEW COMPARISON:  05/07/2015 FINDINGS: Mild cardiomegaly which has increased since  2016. Other mediastinal contours are within normal limits. Visualized tracheal air column is within normal limits. Lung volumes are mildly larger, now at the upper limits of normal. No pneumothorax, pulmonary edema, pleural effusion or confluent pulmonary opacity. Negative visible bowel gas pattern. No acute osseous abnormality identified. IMPRESSION: 1.  No acute cardiopulmonary abnormality. 2. Suspect development of mild cardiomegaly and pulmonary hyperinflation since 2016. Electronically Signed   By: Odessa FlemingH  Hall M.D.   On: 01/23/2019 07:16   Ct Head Wo Contrast  Result Date: 01/23/2019 CLINICAL  DATA:  58 year old male with intermittent dizziness, headache, cough shortness of breath and diarrhea for 2 weeks. EXAM: CT HEAD WITHOUT CONTRAST TECHNIQUE: Contiguous axial images were obtained from the base of the skull through the vertex without intravenous contrast. COMPARISON:  Brain MRI 04/16/2017 and head CT 11/12/2015. FINDINGS: Brain: No midline shift, mass effect, or evidence of intracranial mass lesion. No ventriculomegaly. No acute intracranial hemorrhage identified. 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 Patchy and confluent bilateral cerebral white matter hypodensity is similar to that on the 2018 MRI. Chronic involvement of the right basal ganglia on series 2, image 13 is stable. No cortical encephalomalacia identified. No cortically based acute infarct identified. Vascular: Calcified atherosclerosis at the skull base. No suspicious intracranial vascular hyperdensity. Skull: Negative. Sinuses/Orbits: Visualized paranasal sinuses and mastoids are stable and well pneumatized. Other: Visualized orbits and scalp soft tissues are within normal limits. IMPRESSION: 1. No acute intracranial abnormality. 2. Chronic nonspecific white matter disease, and chronic cerebellar ischemic disease appear stable since 2018. Electronically Signed   By: Odessa Fleming M.D.   On: 01/23/2019 07:08      The  results of significant diagnostics from this hospitalization (including imaging, microbiology, ancillary and laboratory) are listed below for reference.     Microbiology: Recent Results (from the past 240 hour(s))  SARS Coronavirus 2 (CEPHEID - Performed in [facility] hospital lab), Hosp Order     Status: None   Collection Time: 01/23/19  6:17 AM  Result Value Ref Range Status   SARS Coronavirus 2 NEGATIVE NEGATIVE Final    Comment: (NOTE) If result is NEGATIVE SARS-CoV-2 target nucleic acids are NOT DETECTED. The SARS-CoV-2 RNA is generally detectable in upper and lower  respiratory specimens during the acute phase of infection. The lowest  concentration of SARS-CoV-2 viral copies this assay can detect is 250  copies / mL. A negative result does not preclude SARS-CoV-2 infection  and should not be used as the sole basis for treatment or other  patient management decisions.  A negative result may occur with  improper specimen collection / handling, submission of specimen other  than nasopharyngeal swab, presence of viral mutation(s) within the  areas targeted by this assay, and inadequate number of viral copies  (<250 copies / mL). A negative result must be combined with clinical  observations, patient history, and epidemiological information. If result is POSITIVE SARS-CoV-2 target nucleic acids are DETECTED. The SARS-CoV-2 RNA is generally detectable in upper and lower  respiratory specimens dur ing the acute phase of infection.  Positive  results are indicative of active infection with SARS-CoV-2.  Clinical  correlation with patient history and other diagnostic information is  necessary to determine patient infection status.  Positive results do  not rule out bacterial infection or co-infection with other viruses. If result is PRESUMPTIVE POSTIVE SARS-CoV-2 nucleic acids MAY BE PRESENT.   A presumptive positive result was obtained on the submitted specimen  and confirmed on  repeat testing.  While 2019 novel coronavirus  (SARS-CoV-2) nucleic acids may be present in the submitted sample  additional confirmatory testing may be necessary for epidemiological  and / or clinical management purposes  to differentiate between  SARS-CoV-2 and other Sarbecovirus currently known to infect humans.  If clinically indicated additional testing with an alternate test  methodology [phone]) is advised. The SARS-CoV-2 RNA is generally  detectable in upper and lower respiratory sp ecimens during the acute  phase of infection. The expected result is Negative.  Fact Sheet for Patients:  BoilerBrush.com.cy Fact Sheet for Healthcare Providers: https://pope.com/ This test is not yet approved or cleared by the [place] FDA and has been authorized for detection and/or diagnosis of SARS-CoV-2 by FDA under an Emergency Use Authorization (EUA).  This EUA will remain in effect (meaning this test can be used) for the duration of the COVID-19 declaration under Section 564(b)(1) of the Act, 21 U.S.C. section 360bbb-3(b)(1), unless the authorization is terminated or revoked sooner. Performed at [facility], [address] [place], [place] [address]      Labs: BNP (last 3 results) Recent Labs    01/23/19 0616  BNP 50.0   Basic Metabolic Panel: Recent Labs  Lab 01/23/19 0616  NA 138  K 4.2  CL 104  CO2 22  GLUCOSE 117*  BUN 11  CREATININE 0.97  CALCIUM 9.4   Liver Function Tests: Recent Labs  Lab 01/23/19 0616  AST 35  ALT 25  ALKPHOS 65  BILITOT 0.7  PROT 8.2*  ALBUMIN 4.5   No results for input(s): LIPASE, AMYLASE in the last 168 hours. No results for input(s): AMMONIA in the last 168 hours. CBC: Recent Labs  Lab 01/23/19 0616  WBC 7.6  NEUTROABS 3.8  HGB 15.6  HCT 45.9  MCV 96.6  PLT 244   Cardiac Enzymes: Recent Labs  Lab 01/23/19 0616 01/23/19 0908 01/23/19 1940  TROPONINI <0.03 0.03* 0.03*    BNP: Invalid input(s): POCBNP CBG: Recent Labs  Lab 01/23/19 0622  GLUCAP 131*   D-Dimer No results for input(s): DDIMER in the last 72 hours. Hgb A1c No results for input(s): HGBA1C in the last 72 hours. Lipid Profile No results for input(s): CHOL, HDL, LDLCALC, TRIG, CHOLHDL, LDLDIRECT in the last 72 hours. Thyroid function studies No results for input(s): TSH, T4TOTAL, T3FREE, THYROIDAB in the last 72 hours.  Invalid input(s): FREET3 Anemia work up No results for input(s): VITAMINB12, FOLATE, FERRITIN, TIBC, IRON, RETICCTPCT in the last 72 hours. Urinalysis    Component Value Date/Time   COLORURINE YELLOW 01/23/2019 0620   APPEARANCEUR CLEAR 01/23/2019 0620   LABSPEC 1.008 01/23/2019 0620   PHURINE 6.0 01/23/2019 0620   GLUCOSEU NEGATIVE 01/23/2019 0620   HGBUR NEGATIVE 01/23/2019 0620   BILIRUBINUR NEGATIVE 01/23/2019 0620   KETONESUR NEGATIVE 01/23/2019 0620   PROTEINUR NEGATIVE 01/23/2019 0620   UROBILINOGEN 0.2 08/05/2012 1053   NITRITE NEGATIVE 01/23/2019 0620   LEUKOCYTESUR NEGATIVE 01/23/2019 0620   Sepsis Labs Invalid input(s): PROCALCITONIN,  WBC,  LACTICIDVEN Microbiology Recent Results (from the past 240 hour(s))  SARS Coronavirus 2 (CEPHEID - Performed in [facility] hospital lab), Hosp Order     Status: None   Collection Time: 01/23/19  6:17 AM  Result Value Ref Range Status   SARS Coronavirus 2 NEGATIVE NEGATIVE Final    Comment: (NOTE) If result is NEGATIVE SARS-CoV-2 target nucleic acids are NOT DETECTED. The SARS-CoV-2 RNA is generally detectable in upper and lower  respiratory specimens during the acute phase of infection. The lowest  concentration of SARS-CoV-2 viral copies this assay can detect is 250  copies / mL. A negative result does not preclude SARS-CoV-2 infection  and should not be used as the sole basis for treatment or other  patient management decisions.  A negative result may occur with  improper specimen collection /  handling, submission of specimen other  than nasopharyngeal swab, presence of viral mutation(s) within the  areas targeted by this assay, and inadequate number of viral copies  (<  250 copies / mL). A negative result must be combined with clinical  observations, patient history, and epidemiological information. If result is POSITIVE SARS-CoV-2 target nucleic acids are DETECTED. The SARS-CoV-2 RNA is generally detectable in upper and lower  respiratory specimens dur ing the acute phase of infection.  Positive  results are indicative of active infection with SARS-CoV-2.  Clinical  correlation with patient history and other diagnostic information is  necessary to determine patient infection status.  Positive results do  not rule out bacterial infection or co-infection with other viruses. If result is PRESUMPTIVE POSTIVE SARS-CoV-2 nucleic acids MAY BE PRESENT.   A presumptive positive result was obtained on the submitted specimen  and confirmed on repeat testing.  While 2019 novel coronavirus  (SARS-CoV-2) nucleic acids may be present in the submitted sample  additional confirmatory testing may be necessary for epidemiological  and / or clinical management purposes  to differentiate between  SARS-CoV-2 and other Sarbecovirus currently known to infect humans.  If clinically indicated additional testing with an alternate test  methodology [phone]) is advised. The SARS-CoV-2 RNA is generally  detectable in upper and lower respiratory sp ecimens during the acute  phase of infection. The expected result is Negative. Fact Sheet for Patients:  BoilerBrush.com.cy Fact Sheet for Healthcare Providers: https://pope.com/ This test is not yet approved or cleared by the [place] FDA and has been authorized for detection and/or diagnosis of SARS-CoV-2 by FDA under an Emergency Use Authorization (EUA).  This EUA will remain in effect (meaning this  test can be used) for the duration of the COVID-19 declaration under Section 564(b)(1) of the Act, 21 U.S.C. section 360bbb-3(b)(1), unless the authorization is terminated or revoked sooner. Performed at [facility], [address], [place], [place] [address]      Time coordinating discharge in minutes: 65  SIGNED:   Calvert Cantor, MD  [facility] 01/24/2019, 3:01 PM Pager   If 7PM-7AM, please contact night-coverage [email or web address] Password [email or web address]

## 2019-01-24 NOTE — Consult Note (Signed)
Cardiology Consultation:   Patient ID: CALIL AMOR; [id number]; [birth year]   Admit date: 01/23/2019 Date of Consult: 01/24/2019  Primary Care Provider: [facility] Primary Cardiologist: Prentice Docker, MD New Primary Electrophysiologist:  None   Patient Profile:   [name] is a 58 y.o. male with a hx of CVA 2013, HTN, HLD, tob use, hx cocaine/ETOH abuse, who is being seen today for the evaluation of chest pain at the request of Dr Butler Denmark.  History of Present Illness:   Mr. Pellicane was scheduled for an evaluation for chest pain 03/25/2018 but did not show up.   He was admitted 05/14 for chest pain, cards asked to see.   Attending addendum:  The patient is a 58 year old male with a history of hypertension, CVA in 2013, hyperlipidemia, and tobacco abuse.  He ran out of several medications 2 months ago and has been taking aspirin and bisoprolol.  Yesterday while getting ready for work he had some sharp retrosternal chest pains accompanied by shortness of breath and dizziness.  Because of the COVID pandemic and his shortness of breath he decided to come to the ED.  He notes that chest pain and shortness of breath actually improved with exertion.  He describes a sharp retrosternal pain which occurred yesterday and a dull left-sided chest pain.  He has had episodic chest pains over the last few weeks which improve when he gets physical activity and does push-ups.  He notices worsening symptoms after eating foods rich in starch and oily/fried foods.  He currently denies any chest pain or shortness of breath.  Troponins have been nonspecifically elevated with 2 readings of 0.03.  ECG shows sinus rhythm with a PAC and right bundle branch block.    Past Medical History:  Diagnosis Date   Anginal pain (HCC)    Hepatitis B 1991   History of alcohol abuse    History of cocaine abuse (HCC)    Hypercholesterolemia    Hypertension    Mood  disorder (HCC)    NOS. Admitted to [facility] for detox in 12/2005   Stroke [facility]) 08/05/2012   denies residual (08/05/2012)   Tobacco abuse     Past Surgical History:  Procedure Laterality Date   MOUTH SURGERY  2011   "had to cut tooth out of my gum" (08/05/2012)   TEE WITHOUT CARDIOVERSION  08/07/2012   Procedure: TRANSESOPHAGEAL ECHOCARDIOGRAM (TEE);  Surgeon: Vesta Mixer, MD;  Location: [facility] ENDOSCOPY;  Service: Cardiovascular;  Laterality: N/A;     Prior to Admission medications   Medication Sig Start Date End Date Taking? Authorizing Provider  bisoprolol-hydrochlorothiazide (ZIAC) 5-6.25 MG tablet Take 1 tablet by mouth daily. 04/16/17  Yes Doug Sou, MD  lisinopril-hydrochlorothiazide (PRINZIDE,ZESTORETIC) 20-12.5 MG per tablet Take 1 tablet by mouth daily.  04/01/14  [provider]    Inpatient Medications: Scheduled Meds:  bisoprolol  5 mg Oral Daily   hydrochlorothiazide  6.25 mg Oral Daily   nicotine  7 mg Transdermal Daily   pantoprazole  40 mg Oral BID AC   rosuvastatin  10 mg Oral Daily   Continuous Infusions:  PRN Meds: acetaminophen **OR** acetaminophen, hydrALAZINE  Allergies:    Allergies  Allergen Reactions   Lisinopril Swelling   Doxycycline     Social History:   Social History   Socioeconomic History   Marital status: Married    Spouse name: Not on file   Number of children: 1   Years of education: 1y college  Highest education level: Not on file  Occupational History   Occupation: FIELD OPERATIONS    Employer: UNEMPLOYED  Social Network engineer strain: Not on file   Food insecurity:    Worry: Not on file    Inability: Not on file   Transportation needs:    Medical: Not on file    Non-medical: Not on file  Tobacco Use   Smoking status: Current Every Day Smoker    Packs/day: 1.50    Years: 35.00    Pack years: 52.50    Types: Cigarettes   Smokeless tobacco: Current User    Types: Snuff    Substance and Sexual Activity   Alcohol use: Yes    Alcohol/week: 2.0 standard drinks    Types: 2 Cans of beer per week    Comment: 1-2 beers a week / previously drank heavily until 2009 6 pack or more of beer daily.   Drug use: Yes    Types: Marijuana, Heroin    Comment: stopped in [facility], smoked cocaine, some heroin (quit in 2011); last UDS 03/2012 + for cocaine/resident note 08/05/2012   Sexual activity: Yes    Comment: patient reports no longer uses cocaine.   Lifestyle   Physical activity:    Days per week: Not on file    Minutes per session: Not on file   Stress: Not on file  Relationships   Social connections:    Talks on phone: Not on file    Gets together: Not on file    Attends religious service: Not on file    Active member of club or organization: Not on file    Attends meetings of clubs or organizations: Not on file    Relationship status: Not on file   Intimate partner violence:    Fear of current or ex partner: Not on file    Emotionally abused: Not on file    Physically abused: Not on file    Forced sexual activity: Not on file  Other Topics Concern   Not on file  Social History Narrative   Lives in [place], [place] alone.    Family History:   Family History  Problem Relation Age of Onset   Hypertension Mother    Osteoarthritis Mother    Hyperlipidemia Mother    Prostate cancer Father    CAD Maternal Grandmother    Lung cancer Paternal Grandfather        tobacco abuse   Family Status:  Family Status  Relation Name Status   Mother  (Not Specified)   Father  (Not Specified)   MGM  (Not Specified)   PGF  (Not Specified)    ROS:  Please see the history of present illness.  All other ROS reviewed and negative.     Physical Exam/Data:   Vitals:   01/23/19 1321 01/23/19 2126 01/23/19 2341 01/24/19 0500  BP: (!) 173/97 (!) 185/109 (!) 168/84 (!) 163/97  Pulse: (!) 53 (!) 57 62 64  Resp: Temp: 98.3 F (36.8 C) 99 F  (37.2 C)  98.6 F (37 C)  TempSrc: Oral Oral  Oral  SpO2: 99% 97%  99%  Weight:      Height:        Intake/Output Summary (Last 24 hours) at 01/24/2019 0738 Last data filed at 01/23/2019 2100 Gross per 24 hour  Intake 720 ml  Output 400 ml  Net 320 ml   Filed Weights   01/23/19 0556 01/23/19  1048  Weight: 83.9 kg 83.7 kg   Body mass index is 28.9 kg/m.  General:  Well nourished, well developed, in no acute distress HEENT: normal Lymph: no adenopathy Neck: no JVD Endocrine:  No thryomegaly Vascular: No carotid bruits; 4/4 extremity pulses 2+, without bruits  Cardiac:  normal S1, S2; RRR; no murmur  Lungs:  clear to auscultation bilaterally, no wheezing, rhonchi or rales  Abd: soft, nontender, no hepatomegaly  Ext: no edema Musculoskeletal:  No deformities, BUE and BLE strength normal and equal Skin: warm and dry  Neuro:  CNs 2-12 intact, no focal abnormalities noted Psych:  Normal affect   EKG:  The EKG was personally reviewed and demonstrates:  05/14 ECG is SR, HR 60, RBBB is old, no sig change from 04/2017 Telemetry:  Telemetry was personally reviewed and demonstrates: Sinus rhythm  Relevant CV Studies:  ECHO: 08/06/2012 - Left ventricle: The cavity size was normal. There was mild concentric hypertrophy. Systolic function was normal. The estimated ejection fraction was in the range of 50% to 55%. Wall motion was normal; there were no regional wall motion abnormalities. There are prominent apical false tendonae (normal variants). Doppler parameters are consistent with abnormal left ventricular relaxation (grade 1 diastolic dysfunction). The E/e' ratio is >10, suggesting elevated LV filling pressure. - Mitral valve: Mildly thickened leaflets . Trivial regurgitation. - Left atrium: The atrium was normal in size. - Atrial septum: No defect or patent foramen ovale was identified. - Inferior vena cava: The vessel was normal in size;  the respirophasic diameter changes were in the normal range (= 50%); findings are consistent with normal central venous pressure. - Impressions: No cardiac source of emboli was indentified. Impressions:  - No cardiac source of emboli was indentified.  Laboratory Data:  Chemistry Recent Labs  Lab 01/23/19 0616  NA 138  K 4.2  CL 104  CO2 22  GLUCOSE 117*  BUN 11  CREATININE 0.97  CALCIUM 9.4  GFRNONAA >60  GFRAA >60  ANIONGAP 12    Lab Results  Component Value Date   ALT 25 01/23/2019   AST 35 01/23/2019   ALKPHOS 65 01/23/2019   BILITOT 0.7 01/23/2019   Hematology Recent Labs  Lab 01/23/19 0616  WBC 7.6  RBC 4.75  HGB 15.6  HCT 45.9  MCV 96.6  MCH 32.8  MCHC 34.0  RDW 12.8  PLT 244   Cardiac Enzymes Recent Labs  Lab 01/23/19 0616 01/23/19 0908 01/23/19 1940  TROPONINI <0.03 0.03* 0.03*     BNP Recent Labs  Lab 01/23/19 0616  BNP 50.0    TSH:  Lab Results  Component Value Date   TSH 0.654 08/05/2012   Lipids: Lab Results  Component Value Date   CHOL 164 08/05/2012   HDL 38 (L) 08/05/2012   LDLCALC 101 (H) 08/05/2012   TRIG 125 08/05/2012   CHOLHDL 4.3 08/05/2012   HgbA1c: Lab Results  Component Value Date   HGBA1C 5.5 08/05/2012   Magnesium:  Magnesium  Date Value Ref Range Status  08/07/2012 1.8 1.5 - 2.5 mg/dL Final   Drugs of Abuse     Component Value Date/Time   LABOPIA NONE DETECTED 01/23/2019 0620   COCAINSCRNUR NONE DETECTED 01/23/2019 0620   LABBENZ NONE DETECTED 01/23/2019 0620   AMPHETMU NONE DETECTED 01/23/2019 0620   THCU POSITIVE (A) 01/23/2019 0620   LABBARB NONE DETECTED 01/23/2019 4098     Radiology/Studies:  Dg Chest 2 View  Result Date: 01/23/2019 CLINICAL DATA:  58 year old male  with intermittent cough dizziness shortness of breath nausea and diarrhea. EXAM: CHEST - 2 VIEW COMPARISON:  05/07/2015 FINDINGS: Mild cardiomegaly which has increased since 2016. Other mediastinal contours are within  normal limits. Visualized tracheal air column is within normal limits. Lung volumes are mildly larger, now at the upper limits of normal. No pneumothorax, pulmonary edema, pleural effusion or confluent pulmonary opacity. Negative visible bowel gas pattern. No acute osseous abnormality identified. IMPRESSION: 1.  No acute cardiopulmonary abnormality. 2. Suspect development of mild cardiomegaly and pulmonary hyperinflation since 2016. Electronically Signed   By: Odessa FlemingH  Hall M.D.   On: 01/23/2019 07:16   Ct Head Wo Contrast  Result Date: 01/23/2019 CLINICAL DATA:  58 year old male with intermittent dizziness, headache, cough shortness of breath and diarrhea for 2 weeks. EXAM: CT HEAD WITHOUT CONTRAST TECHNIQUE: Contiguous axial images were obtained from the base of the skull through the vertex without intravenous contrast. COMPARISON:  Brain MRI 04/16/2017 and head CT 11/12/2015. FINDINGS: Brain: No midline shift, mass effect, or evidence of intracranial mass lesion. No ventriculomegaly. No acute intracranial hemorrhage identified. 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 Patchy and confluent bilateral cerebral white matter hypodensity is similar to that on the 2018 MRI. Chronic involvement of the right basal ganglia on series 2, image 13 is stable. No cortical encephalomalacia identified. No cortically based acute infarct identified. Vascular: Calcified atherosclerosis at the skull base. No suspicious intracranial vascular hyperdensity. Skull: Negative. Sinuses/Orbits: Visualized paranasal sinuses and mastoids are stable and well pneumatized. Other: Visualized orbits and scalp soft tissues are within normal limits. IMPRESSION: 1. No acute intracranial abnormality. 2. Chronic nonspecific white matter disease, and chronic cerebellar ischemic disease appear stable since 2018. Electronically Signed   By: Odessa FlemingH  Hall M.D.   On: 01/23/2019 07:08    Assessment and Plan:   1. Chest pain: Symptoms are  atypical in that they improved with exertion.  Troponins are nonspecifically elevated at 0.03 and are likely due to hypertensive urgency. I will order a 2-D echocardiogram with Doppler to evaluate cardiac structure, function, and regional wall motion. I will arrange for outpatient coronary CT angiography to assess for obstructive coronary artery disease.  Continue aspirin, beta-blocker, and statin.  2.  Hypertensive urgency: Blood pressure remains elevated this morning.  He has been restarted on bisoprolol and hydrochlorothiazide.  3.  Tobacco abuse: He has smoked a pack of cigarettes daily for the past 45 years.  He needs complete cessation and we talked about this.  4.  Hyperlipidemia: He ran out of rosuvastatin 2 months ago.  This has been resumed.  5.  History of CVA: Needs optimal blood pressure control.  Statin therapy has been resumed.  Disposition: I will obtain an echocardiogram this morning.  I will arrange for outpatient coronary CT angiography and follow-up with cardiology.  Continue aspirin, beta-blocker, and statin therapy.  He can be discharged from my standpoint.  Prentice DockerSuresh Rayola Everhart, M.D., F.A.C.C.   For questions or updates, please contact [facility] Please consult [email or web address] for contact info under Cardiology/STEMI.   Signed, Theodore DemarkRhonda Barrett, PA-C  01/24/2019 7:38 AM  The patient was seen and examined, and I agree with the history, physical exam, assessment and plan as documented above.  I have made modifications to the HPI, physical exam, and assessment/plan. I have also personally reviewed all relevant documentation, old records, labs, and both radiographic and cardiovascular studies. I have also independently interpreted old and new ECG's.   Prentice DockerSuresh Kristine Chahal, MD, [facility]  01/24/2019  9:43 AM

## 2019-01-24 NOTE — Clinical Social Work Note (Signed)
Reviewed pt.'s record today. Patient is low risk on the readmission scale.  No immediate TOC needs identified. Will be available if needs arise.     Willia Lampert, Juleen China, LCSW

## 2019-01-24 NOTE — Progress Notes (Signed)
[name] to be D/C'd per MD order. Discussed with the patient and all questions fully answered. ? VSS, Skin clean, dry and intact without evidence of skin break down, no evidence of skin tears noted. ? IV catheter discontinued intact. Site without signs and symptoms of complications. Dressing and pressure applied. ? An After Visit Summary was printed and given to the patient. Patient informed where to pickup prescriptions. ? D/c education completed with patient/family including follow up instructions, medication list, d/c activities limitations if indicated, with other d/c instructions as indicated by MD - patient able to verbalize understanding, all questions fully answered.  ? Patient instructed to return to ED, call 911, or call MD for any changes in condition.  ? Patient to be escorted via WC, and D/C home via private auto.

## 2019-02-24 ENCOUNTER — Ambulatory Visit
Admission: EM | Admit: 2019-02-24 | Discharge: 2019-02-24 | Disposition: A | Discharge status: Home / Self Care | Payer: BC Managed Care – PPO | Attending: Emergency Medicine | Admitting: Emergency Medicine

## 2019-02-24 ENCOUNTER — Other Ambulatory Visit: Payer: Self-pay

## 2019-02-24 ENCOUNTER — Telehealth: Payer: Self-pay | Admitting: Hematology

## 2019-02-24 ENCOUNTER — Telehealth: Payer: Self-pay | Admitting: *Deleted

## 2019-02-24 DIAGNOSIS — Z20822 Contact with and (suspected) exposure to covid-19: Secondary | ICD-10-CM

## 2019-02-24 DIAGNOSIS — R6889 Other general symptoms and signs: Secondary | ICD-10-CM | POA: Diagnosis not present

## 2019-02-24 MED ORDER — BENZONATATE 100 MG PO CAPS: 100.0000 mg | ORAL_CAPSULE | Freq: Three times a day (TID) | ORAL | 0 refills | Status: DC

## 2019-02-24 MED ORDER — CETIRIZINE HCL 10 MG PO CHEW: 10.0000 mg | CHEWABLE_TABLET | Freq: Every day | ORAL | 0 refills | Status: DC

## 2019-02-24 MED ORDER — FLUTICASONE PROPIONATE 50 MCG/ACT NA SUSP: 2.0000 | Freq: Every day | NASAL | 0 refills | Status: DC

## 2019-02-24 NOTE — Telephone Encounter (Signed)
Attempted to call patient, however his voicemail is not set up so I could not leave a message.

## 2019-02-24 NOTE — Discharge Instructions (Addendum)
COVID testing ordered.  Outpatient center will contact you regarding your appointment  In the meantime: You should remain isolated in your home for 7 days from symptom onset AND greater than 72 hours after symptoms resolution (absence of fever without the use of fever-reducing medication and improvement in respiratory symptoms), whichever is longer Get plenty of rest and push fluids Zyrtec and flonase prescribed for runny nose, congestion and sore throat Tessalon perles prescribed for cough Take OTC tylenol as needed for fever, body aches, and/or chills Call or go to the ED if you have any new or worsening symptoms such as fever, worsening cough, shortness of breath, chest tightness, chest pain, turning blue, changes in mental status, etc..Marland Kitchen

## 2019-02-24 NOTE — ED Triage Notes (Signed)
Pt describes flu like symptoms that began on Saturday, pt works in food production and states that may have had exposure to covid positive coworkers

## 2019-02-24 NOTE — Telephone Encounter (Signed)
Attempted x 2 but automated message received that pt does not have voicemail box set up at this time.

## 2019-02-24 NOTE — Telephone Encounter (Signed)
-----   Message from Lestine Box, [place] sent at 02/24/2019  1:27 PM EDT ----- Regarding: COVID testing FLU LIKE SYMPTOMS POSSIBLE EXPOSURE TO COVID AT WORK

## 2019-02-24 NOTE — Telephone Encounter (Signed)
-----   Message from Brittany Wurst, PA-C sent at 02/24/2019  1:27 PM EDT ----- Regarding: COVID testing FLU LIKE SYMPTOMS POSSIBLE EXPOSURE TO COVID AT WORK  

## 2019-02-24 NOTE — ED Provider Notes (Signed)
[facility]   [id number] 02/24/19 Arrival Time: 1247   CC: URI symptoms   SUBJECTIVE: History from: patient.  [name] is a 58 y.o. male hx significant for anginal pain, hep B, alcohol abuse, cocaine abuse, hypercholesterolemia, HTN, mood disorder, stroke, and tobacco abuse, who presents with abrupt onset of body aches, fatigue, runny nose, congestion, sore throat, and mild productive cough x 2 days.  Admits to possible exposure to COVID at work.  Has NOT tried OTC medication.  Denies aggravating factors.  Reports previous symptoms in the past with flu/ cold symptoms.   Complains of subjective fever, and SOB.  Denies sinus pain, wheezing, chest pain, nausea, changes in bowel or bladder habits.    ROS: As per HPI.  Past Medical History:  Diagnosis Date  . Anginal pain (HCC)   . Hepatitis B 1991  . History of alcohol abuse   . History of cocaine abuse (HCC)   . Hypercholesterolemia   . Hypertension   . Mood disorder (HCC)    NOS. [facility]  . Stroke (HCC) 08/05/2012   denies residual (08/05/2012)  . Tobacco abuse    Past Surgical History:  Procedure Laterality Date  . MOUTH SURGERY  2011   "had to cut tooth out of my gum" (08/05/2012)  . TEE WITHOUT CARDIOVERSION  08/07/2012   Procedure: TRANSESOPHAGEAL ECHOCARDIOGRAM (TEE);  Surgeon: Vesta MixerPhilip J Nahser, MD;  [facility];  Service: Cardiovascular;  Laterality: N/A;   Allergies  Allergen Reactions  . Lisinopril Swelling  . Doxycycline    No current facility-administered medications on file prior to encounter.    Current Outpatient Medications on File Prior to Encounter  Medication Sig Dispense Refill  . amLODipine (NORVASC) 10 MG tablet Take 1 tablet (10 mg total) by mouth daily. 30 tablet 0  . aspirin 325 MG tablet Take 325 mg by mouth daily.    . bisoprolol-hydrochlorothiazide (ZIAC) 5-6.25 MG tablet Take 1 tablet by mouth daily. 30 tablet 0  . pantoprazole (PROTONIX)  40 MG tablet Take 1 tablet (40 mg total) by mouth 2 (two) times daily before a meal. 60 tablet 0  . rosuvastatin (CRESTOR) 10 MG tablet Take 1 tablet (10 mg total) by mouth daily. 30 tablet 0   Social History   Socioeconomic History  . Marital status: Married    Spouse name: Not on file  . Number of children: 1  . Years of education: 1y college  . Highest education level: Not on file  Occupational History  . Occupation: FIELD OPERATIONS    Employer: UNEMPLOYED  Social Needs  . Financial resource strain: Not on file  . Food insecurity    Worry: Not on file    Inability: Not on file  . Transportation needs    Medical: Not on file    Non-medical: Not on file  Tobacco Use  . Smoking status: Current Every Day Smoker    Packs/day: 1.50    Years: 35.00    Pack years: 52.50    Types: Cigarettes  . Smokeless tobacco: Current User    Types: Snuff  Substance and Sexual Activity  . Alcohol use: Yes    Alcohol/week: 2.0 standard drinks    Types: 2 Cans of beer per week    Comment: 1-2 beers a week / previously drank heavily until 2009 6 pack or more of beer daily.  . Drug use: Yes    Types: Marijuana, Heroin    Comment: [facility], smoked cocaine, some heroin (quit in 2011); last UDS 03/2012 + for cocaine/resident note 08/05/2012  . Sexual activity: Yes    Comment: patient reports no longer uses cocaine.   Lifestyle  . Physical activity    Days per week: Not on file    Minutes per session: Not on file  . Stress: Not on file  Relationships  . Social Musicianconnections    Talks on phone: Not on file    Gets together: Not on file    Attends religious service: Not on file    Active member of club or organization: Not on file    Attends meetings of clubs or organizations: Not on file    Relationship status: Not on file  . Intimate partner violence    Fear of current or ex partner: Not on file    Emotionally abused: Not on file    Physically abused: Not on file    Forced sexual  activity: Not on file  Other Topics Concern  . Not on file  Social History Narrative   [place]   Family History  Problem Relation Age of Onset  . Hypertension Mother   . Osteoarthritis Mother   . Hyperlipidemia Mother   . Prostate cancer Father   . CAD Maternal Grandmother   . Lung cancer Paternal Grandfather        tobacco abuse    OBJECTIVE:  Vitals:   02/24/19 1301  BP: (!) 161/96  Pulse: (!) 57  Resp: 20  Temp: 98.7 F (37.1 C)  SpO2: 95%     General appearance: alert; appears mildly fatigued, but nontoxic; speaking in full sentences and tolerating own secretions HEENT: NCAT; Ears: EACs clear, TMs pearly gray; Eyes: PERRL.  EOM grossly intact. Nose: nares patent without rhinorrhea, Throat: oropharynx erythematous, but clear, tonsils non erythematous or enlarged, uvula midline  Neck: supple without LAD Lungs: unlabored respirations, symmetrical air entry; cough: mild; no respiratory distress; CTAB Heart: regular rate and rhythm.  Radial pulses 2+ symmetrical bilaterally Skin: warm and dry Psychological: alert and cooperative; normal mood and affect  ASSESSMENT & PLAN:  1. Suspected Covid-19 Virus Infection     Meds ordered this encounter  Medications  . cetirizine (ZYRTEC) 10 MG chewable tablet    Sig: Chew 1 tablet (10 mg total) by mouth daily.    Dispense:  20 tablet    Refill:  0    Order Specific Question:   Supervising Provider    Answer:   Eustace MooreNELSON, YVONNE SUE [[id number]]  . fluticasone (FLONASE) 50 MCG/ACT nasal spray    Sig: Place 2 sprays into both nostrils daily.    Dispense:  16 g    Refill:  0    Order Specific Question:   Supervising Provider    Answer:   Eustace MooreNELSON, YVONNE SUE [[id number]]  . benzonatate (TESSALON) 100 MG capsule    Sig: Take 1 capsule (100 mg total) by mouth every 8 (eight) hours.    Dispense:  21 capsule    Refill:  0    Order Specific Question:   Supervising Provider    Answer:   Eustace MooreELSON, YVONNE SUE [[id number]]     COVID testing ordered.  Outpatient center will contact you regarding your appointment  In the meantime: You should remain isolated in your home for 7 days from symptom onset AND greater than 72 hours after symptoms resolution (absence of fever without the use of fever-reducing medication and improvement in respiratory symptoms),  whichever is longer Get plenty of rest and push fluids Zyrtec and flonase prescribed for runny nose, congestion and sore throat Tessalon perles prescribed for cough Take OTC tylenol as needed for fever, body aches, and/or chills Call or go to the ED if you have any new or worsening symptoms such as fever, worsening cough, shortness of breath, chest tightness, chest pain, turning blue, changes in mental status, etc...   Reviewed expectations re: course of current medical issues. Questions answered. Outlined signs and symptoms indicating need for more acute intervention. Patient verbalized understanding. After Visit Summary given.         Lestine Box, PA-C 02/24/19 1510

## 2019-02-25 ENCOUNTER — Telehealth: Payer: Self-pay | Admitting: *Deleted

## 2019-02-25 ENCOUNTER — Other Ambulatory Visit: Payer: BC Managed Care – PPO

## 2019-02-25 ENCOUNTER — Telehealth: Payer: Self-pay | Admitting: Hematology

## 2019-02-25 DIAGNOSIS — R079 Chest pain, unspecified: Secondary | ICD-10-CM

## 2019-02-25 DIAGNOSIS — Z20822 Contact with and (suspected) exposure to covid-19: Secondary | ICD-10-CM

## 2019-02-25 NOTE — Telephone Encounter (Signed)
-----   Message from Lestine Box, [place] sent at 02/25/2019  2:26 PM EDT ----- Regarding: FW: COVID testing Pt called office today.  States phone died yesterday.  Can be reached at [phone].    Thank you, [place] ----- Message ----- From: Kendrick Ranch, RN Sent: 02/24/2019   3:21 PM EDT To: [place], PA-C Subject: RE: COVID testing                              Attempted to call patient, however his voicemail is not set up so I could not leave a message. Thanks,  Tiffany ----- Message ----- From: Lestine Box, Hershal Coria Sent: 02/24/2019   1:27 PM EDT To: Sandria Manly, RN, # Subject: COVID testing                                  FLU LIKE SYMPTOMS POSSIBLE EXPOSURE TO COVID AT WORK

## 2019-02-25 NOTE — Telephone Encounter (Addendum)
Atempted to contact pt to schedule testing; unable to leave message; pre-recorded message states that voicemail has not been set up.   ----- Message from Lestine Box, PA-C sent at 02/25/2019  2:26 PM EDT ----- Regarding: FW: COVID testing Pt called office today.  States phone died yesterday.  Can be reached at [phone].    Thank you, [place] ----- Message ----- From: Kendrick Ranch, RN Sent: 02/24/2019   3:21 PM EDT To: [place], PA-C Subject: RE: COVID testing                              Attempted to call patient, however his voicemail is not set up so I could not leave a message. Thanks,  Tiffany ----- Message ----- From: Lestine Box, Hershal Coria Sent: 02/24/2019   1:27 PM EDT To: Sandria Manly, RN, # Subject: COVID testing                                  FLU LIKE SYMPTOMS POSSIBLE EXPOSURE TO COVID AT WORK

## 2019-02-25 NOTE — Telephone Encounter (Signed)
-----   Message from Lestine Box, [place] sent at 02/25/2019 12:11 PM EDT ----- Regarding: RE: COVID testing Pt called office today.  Stated his phone died yesterday.  You can get in touch with him at [phone].  Thank you, [place] ----- Message ----- From: Kendrick Ranch, RN Sent: 02/24/2019   3:21 PM EDT To: [place], PA-C Subject: RE: COVID testing                              Attempted to call patient, however his voicemail is not set up so I could not leave a message. Thanks,  Saga Balthazar ----- Message ----- From: Lestine Box, Hershal Coria Sent: 02/24/2019   1:27 PM EDT To: Sandria Manly, RN, # Subject: COVID testing                                  FLU LIKE SYMPTOMS POSSIBLE EXPOSURE TO COVID AT WORK

## 2019-02-25 NOTE — Telephone Encounter (Signed)
Attempted to contact patient to schedule COVID testing. / Patient's voicemail is not set up and I am not able to leave a message / Attempt to call the patient x3

## 2019-02-25 NOTE — Telephone Encounter (Signed)
Pt called and scheduled for testing at [facility] site on 02/25/19. Pt advised to wear a mask and to remain in car at appt time. Understanding verbalized.

## 2019-02-25 NOTE — Telephone Encounter (Signed)
Patient states he got the test done today. / I do see the ordered was placed by Cristal Generous, RN / Closing file.

## 2019-02-27 LAB — NOVEL CORONAVIRUS, NAA: SARS-CoV-2, NAA: NOT DETECTED

## 2019-02-28 ENCOUNTER — Telehealth (HOSPITAL_COMMUNITY): Payer: Self-pay | Admitting: Emergency Medicine

## 2019-02-28 NOTE — Telephone Encounter (Signed)
Your test for COVID-19 was negative.  Please continue good preventive care measures, including:  frequent hand-washing, avoid touching your face, cover coughs/sneezes, stay out of crowds and keep a 6 foot distance from others.  If you develop fever/cough/breathlessness, please stay home for 10 days and until you have had 3 consecutive days with cough/breathlessness improving and without fever (without taking a fever reducer). Go to the nearest hospital ED tent for assessment if fever/cough/breathlessness are severe or illness seems like a threat to life..  Patient contacted and made aware of all results, all questions answered.  

## 2019-03-06 ENCOUNTER — Encounter: Payer: Self-pay | Admitting: Physician Assistant

## 2019-07-31 ENCOUNTER — Encounter: Payer: Self-pay | Admitting: Gastroenterology

## 2019-08-10 NOTE — Progress Notes (Deleted)
Referring Provider: Donnelly Stager, PA-C Primary Care Physician:  [facility] Primary Gastroenterologist:  Dr. Bonnetta Barry chief complaint on file.   HPI:   [name] is a 58 y.o. male presenting today at the request of Donnelly Stager, PA-C for colon cancer screening. OV due to alcohol use.   Reviewed recent PCP note dated 07/28/2019.  Patient was following up for HTN, HLD, BPH, and impaired fasting glucose. Also complained of left shoulder pain. His HTN was not adequately controlled with blood pressure of 200/110, repeated at 180/110 in office.  He was asymptomatic. His bisoprolol-HCTZ was doubled with plans to follow up in 1 month. Advised to continue current mediations for HLD, resume FLOMAX for BPH, obtain X-ray of his shoulder with considerations for ortho referral. Referred to GI for colon cancer screening.   Today he states   Past Medical History:  Diagnosis Date  . Anginal pain (HCC)   . Hepatitis B 1991  . History of alcohol abuse   . History of cocaine abuse (HCC)   . Hypercholesterolemia   . Hypertension   . Mood disorder (HCC)    NOS. Admitted to [facility] for detox in 12/2005  . Stroke (HCC) 08/05/2012   denies residual (08/05/2012)  . Tobacco abuse     Past Surgical History:  Procedure Laterality Date  . MOUTH SURGERY  2011   "had to cut tooth out of my gum" (08/05/2012)  . TEE WITHOUT CARDIOVERSION  08/07/2012   Procedure: TRANSESOPHAGEAL ECHOCARDIOGRAM (TEE);  Surgeon: Vesta Mixer, MD;  Location: [facility] ENDOSCOPY;  Service: Cardiovascular;  Laterality: N/A;    Current Outpatient Medications  Medication Sig Dispense Refill  . amLODipine (NORVASC) 10 MG tablet Take 1 tablet (10 mg total) by mouth daily. 30 tablet 0  . aspirin 325 MG tablet Take 325 mg by mouth daily.    . benzonatate (TESSALON) 100 MG capsule Take 1 capsule (100 mg total) by mouth every 8 (eight) hours. 21 capsule 0  .  bisoprolol-hydrochlorothiazide (ZIAC) 5-6.25 MG tablet Take 1 tablet by mouth daily. 30 tablet 0  . cetirizine (ZYRTEC) 10 MG chewable tablet Chew 1 tablet (10 mg total) by mouth daily. 20 tablet 0  . fluticasone (FLONASE) 50 MCG/ACT nasal spray Place 2 sprays into both nostrils daily. 16 g 0  . pantoprazole (PROTONIX) 40 MG tablet Take 1 tablet (40 mg total) by mouth 2 (two) times daily before a meal. 60 tablet 0  . rosuvastatin (CRESTOR) 10 MG tablet Take 1 tablet (10 mg total) by mouth daily. 30 tablet 0   No current facility-administered medications for this visit.     Allergies as of 08/11/2019 - Review Complete 02/24/2019  Allergen Reaction Noted  . Lisinopril Swelling 04/01/2014  . Doxycycline  11/12/2015    Family History  Problem Relation Age of Onset  . Hypertension Mother   . Osteoarthritis Mother   . Hyperlipidemia Mother   . Prostate cancer Father   . CAD Maternal Grandmother   . Lung cancer Paternal Grandfather        tobacco abuse    Social History   Socioeconomic History  . Marital status: Married    Spouse name: Not on file  . Number of children: 1  . Years of education: 1y college  . Highest education level: Not on file  Occupational History  . Occupation: FIELD OPERATIONS    Employer: UNEMPLOYED  Social Needs  . Financial resource strain: Not on file  .  Food insecurity    Worry: Not on file    Inability: Not on file  . Transportation needs    Medical: Not on file    Non-medical: Not on file  Tobacco Use  . Smoking status: Current Every Day Smoker    Packs/day: 1.50    Years: 35.00    Pack years: 52.50    Types: Cigarettes  . Smokeless tobacco: Current User    Types: Snuff  Substance and Sexual Activity  . Alcohol use: Yes    Alcohol/week: 2.0 standard drinks    Types: 2 Cans of beer per week    Comment: 1-2 beers a week / previously drank heavily until 2009 6 pack or more of beer daily.  . Drug use: Yes    Types: Marijuana, Heroin     Comment: stopped in [facility], smoked cocaine, some heroin (quit in 2011); last UDS 03/2012 + for cocaine/resident note 08/05/2012  . Sexual activity: Yes    Comment: patient reports no longer uses cocaine.   Lifestyle  . Physical activity    Days per week: Not on file    Minutes per session: Not on file  . Stress: Not on file  Relationships  . Social Herbalist on phone: Not on file    Gets together: Not on file    Attends religious service: Not on file    Active member of club or organization: Not on file    Attends meetings of clubs or organizations: Not on file    Relationship status: Not on file  . Intimate partner violence    Fear of current or ex partner: Not on file    Emotionally abused: Not on file    Physically abused: Not on file    Forced sexual activity: Not on file  Other Topics Concern  . Not on file  Social History Narrative   Lives in [place], [place] alone.    Review of Systems: Gen: Denies any fever, chills, fatigue, weight loss, lack of appetite.  CV: Denies chest pain, heart palpitations, peripheral edema, syncope.  Resp: Denies shortness of breath at rest or with exertion. Denies wheezing or cough.  GI: Denies dysphagia or odynophagia. Denies jaundice, hematemesis, fecal incontinence. GU : Denies urinary burning, urinary frequency, urinary hesitancy MS: Denies joint pain, muscle weakness, cramps, or limitation of movement.  Derm: Denies rash, itching, dry skin Psych: Denies depression, anxiety, memory loss, and confusion Heme: Denies bruising, bleeding, and enlarged lymph nodes.  Physical Exam: There were no vitals taken for this visit. General:   Alert and oriented. Pleasant and cooperative. Well-nourished and well-developed.  Head:  Normocephalic and atraumatic. Eyes:  Without icterus, sclera clear and conjunctiva pink.  Ears:  Normal auditory acuity. Nose:  No deformity, discharge,  or lesions. Mouth:  No deformity or lesions, oral mucosa pink.   Neck:  Supple, without mass or thyromegaly. Lungs:  Clear to auscultation bilaterally. No wheezes, rales, or rhonchi. No distress.  Heart:  S1, S2 present without murmurs appreciated.  Abdomen:  +BS, soft, non-tender and non-distended. No HSM noted. No guarding or rebound. No masses appreciated.  Rectal:  Deferred  Msk:  Symmetrical without gross deformities. Normal posture. Pulses:  Normal pulses noted. Extremities:  Without clubbing or edema. Neurologic:  Alert and  oriented x4;  grossly normal neurologically. Skin:  Intact without significant lesions or rashes. Cervical Nodes:  No significant cervical adenopathy. Psych:  Alert and cooperative. Normal mood and affect.

## 2019-08-11 ENCOUNTER — Telehealth: Payer: Self-pay | Admitting: Gastroenterology

## 2019-08-11 ENCOUNTER — Ambulatory Visit: Payer: BC Managed Care – PPO | Admitting: Gastroenterology

## 2019-08-11 ENCOUNTER — Encounter: Payer: Self-pay | Admitting: Gastroenterology

## 2019-08-11 NOTE — Telephone Encounter (Signed)
Patient was a no show and letter sent  °

## 2019-08-11 NOTE — Telephone Encounter (Signed)
Noted  

## 2019-11-22 ENCOUNTER — Ambulatory Visit: Payer: BC Managed Care – PPO

## 2020-04-22 ENCOUNTER — Ambulatory Visit: Payer: Self-pay | Attending: Internal Medicine

## 2020-04-22 DIAGNOSIS — Z23 Encounter for immunization: Secondary | ICD-10-CM

## 2020-04-22 NOTE — Progress Notes (Signed)
   Covid-19 Vaccination Clinic  Name:  NORVIL MARTENSEN    MRN: [id number] DOB: 1961-01-02  04/22/2020  Mr. Guttman was observed post Covid-19 immunization for 15 minutes without incident. He was provided with Vaccine Information Sheet and instruction to access the V-Safe system.   Mr. Vences was instructed to call 911 with any severe reactions post vaccine: Marland Kitchen Difficulty breathing  . Swelling of face and throat  . A fast heartbeat  . A bad rash all over body  . Dizziness and weakness   Immunizations Administered    Name Date Dose VIS Date Route   Pfizer COVID-19 Vaccine 04/22/2020 11:27 AM 0.3 mL 11/05/2018 Intramuscular   Manufacturer: [employer], [employer]   Lot: [id number]   NDC: [address]-7

## 2020-04-27 ENCOUNTER — Ambulatory Visit
Admission: EM | Admit: 2020-04-27 | Discharge: 2020-04-27 | Disposition: A | Discharge status: Home / Self Care | Payer: BC Managed Care – PPO | Attending: Emergency Medicine | Admitting: Emergency Medicine

## 2020-04-27 DIAGNOSIS — R112 Nausea with vomiting, unspecified: Secondary | ICD-10-CM | POA: Diagnosis not present

## 2020-04-27 DIAGNOSIS — R197 Diarrhea, unspecified: Secondary | ICD-10-CM

## 2020-04-27 DIAGNOSIS — A059 Bacterial foodborne intoxication, unspecified: Secondary | ICD-10-CM

## 2020-04-27 MED ORDER — ONDANSETRON HCL 4 MG PO TABS: 4.0000 mg | ORAL_TABLET | Freq: Four times a day (QID) | ORAL | 0 refills | Status: DC

## 2020-04-27 NOTE — ED Triage Notes (Signed)
Pt presents with c/o stomach cramping that began on Sunday, no other symptoms

## 2020-04-27 NOTE — Discharge Instructions (Signed)
Get rest and drink fluids Zofran prescribed.  Take as directed.    DIET Instructions:  30 minutes after taking nausea medicine, begin with sips of clear liquids. If able to hold down 2 - 4 ounces for 30 minutes, begin drinking more. Increase your fluid intake to replace losses. Clear liquids only for 24 hours (water, tea, sport drinks, clear flat ginger ale or cola and juices, broth, jello, popsicles, ect). Advance to bland foods, applesauce, rice, baked or boiled chicken, ect. Avoid milk, greasy foods and anything that doesn't agree with you.  If you experience new or worsening symptoms return or go to ER such as fever, chills, nausea, vomiting, diarrhea, bloody or dark tarry stools, constipation, urinary symptoms, worsening abdominal discomfort, symptoms that do not improve with medications, inability to keep fluids down, etc... 

## 2020-04-27 NOTE — ED Provider Notes (Signed)
[facility]   [id number] 04/27/20 Arrival Time: 1125  CC: ABDOMINAL DISCOMFORT  SUBJECTIVE:  [name] is a 59 y.o. male who presents with complaint of abdominal discomfort that began 3 days ago.  Symptoms began after eating some bad meat.  Pain is genearlized.  Describes as improving, intermittent  and cramping in character.  Has not tried OTC medications.  Denies alleviating or aggravating factors.  Reports similar symptoms in the past with food poisoning.  Reports associated nausea and vomiting, that's now improved.  Complains of associated chills, night sweats, diarrhea x 3-4 episodes daily.    Denies fever, chills, chest pain, SOB, constipation, hematochezia, melena, dysuria, difficulty urinating, increased frequency or urgency, flank pain, loss of bowel or bladder function.  No LMP for male patient.  ROS: As per HPI.  All other pertinent ROS negative.     Past Medical History:  Diagnosis Date  . Anginal pain (HCC)   . Hepatitis B 1991  . History of alcohol abuse   . History of cocaine abuse (HCC)   . Hypercholesterolemia   . Hypertension   . Mood disorder (HCC)    NOS. Admitted to [facility] for detox in 12/2005  . Stroke (HCC) 08/05/2012   denies residual (08/05/2012)  . Tobacco abuse    Past Surgical History:  Procedure Laterality Date  . MOUTH SURGERY  2011   "had to cut tooth out of my gum" (08/05/2012)  . TEE WITHOUT CARDIOVERSION  08/07/2012   Procedure: TRANSESOPHAGEAL ECHOCARDIOGRAM (TEE);  Surgeon: Vesta Mixer, MD;  Location: [facility] ENDOSCOPY;  Service: Cardiovascular;  Laterality: N/A;   Allergies  Allergen Reactions  . Lisinopril Swelling  . Doxycycline    No current facility-administered medications on file prior to encounter.   Current Outpatient Medications on File Prior to Encounter  Medication Sig Dispense Refill  . amLODipine (NORVASC) 10 MG tablet Take 1 tablet (10 mg total) by mouth daily. 30 tablet 0  . aspirin 325 MG tablet Take 325  mg by mouth daily.    . benzonatate (TESSALON) 100 MG capsule Take 1 capsule (100 mg total) by mouth every 8 (eight) hours. 21 capsule 0  . bisoprolol-hydrochlorothiazide (ZIAC) 5-6.25 MG tablet Take 1 tablet by mouth daily. 30 tablet 0  . cetirizine (ZYRTEC) 10 MG chewable tablet Chew 1 tablet (10 mg total) by mouth daily. 20 tablet 0  . fluticasone (FLONASE) 50 MCG/ACT nasal spray Place 2 sprays into both nostrils daily. 16 g 0  . pantoprazole (PROTONIX) 40 MG tablet Take 1 tablet (40 mg total) by mouth 2 (two) times daily before a meal. 60 tablet 0  . rosuvastatin (CRESTOR) 10 MG tablet Take 1 tablet (10 mg total) by mouth daily. 30 tablet 0   Social History   Socioeconomic History  . Marital status: Married    Spouse name: Not on file  . Number of children: 1  . Years of education: 1y college  . Highest education level: Not on file  Occupational History  . Occupation: FIELD OPERATIONS    Employer: UNEMPLOYED  Tobacco Use  . Smoking status: Current Every Day Smoker    Packs/day: 1.50    Years: 35.00    Pack years: 52.50    Types: Cigarettes  . Smokeless tobacco: Current User    Types: Snuff  Vaping Use  . Vaping Use: Never used  Substance and Sexual Activity  . Alcohol use: Yes    Alcohol/week: 2.0 standard drinks    Types: 2 Cans  of beer per week    Comment: 1-2 beers a week / previously drank heavily until 2009 6 pack or more of beer daily.  . Drug use: Yes    Types: Marijuana, Heroin    Comment: stopped in [facility], smoked cocaine, some heroin (quit in 2011); last UDS 03/2012 + for cocaine/resident note 08/05/2012  . Sexual activity: Yes    Comment: patient reports no longer uses cocaine.   Other Topics Concern  . Not on file  Social History Narrative   Lives in [place], [place] alone.   Social Determinants of Health   Financial Resource Strain:   . Difficulty of Paying Living Expenses:   Food Insecurity:   . Worried About Programme researcher, broadcasting/film/video in the Last Year:   . Occupational psychologist in the Last Year:   Transportation Needs:   . Freight forwarder (Medical):   Marland Kitchen Lack of Transportation (Non-Medical):   Physical Activity:   . Days of Exercise per Week:   . Minutes of Exercise per Session:   Stress:   . Feeling of Stress :   Social Connections:   . Frequency of Communication with Friends and Family:   . Frequency of Social Gatherings with Friends and Family:   . Attends Religious Services:   . Active Member of Clubs or Organizations:   . Attends Banker Meetings:   Marland Kitchen Marital Status:   Intimate Partner Violence:   . Fear of Current or Ex-Partner:   . Emotionally Abused:   Marland Kitchen Physically Abused:   . Sexually Abused:    Family History  Problem Relation Age of Onset  . Hypertension Mother   . Osteoarthritis Mother   . Hyperlipidemia Mother   . Prostate cancer Father   . CAD Maternal Grandmother   . Lung cancer Paternal Grandfather        tobacco abuse     OBJECTIVE:  Vitals:   04/27/20 1130  BP: (!) 148/90  Pulse: (!) 52  Resp: 16  Temp: 98.5 F (36.9 C)  TempSrc: Oral  SpO2: 97%    General appearance: Alert; NAD HEENT: NCAT.  Oropharynx clear.  Lungs: clear to auscultation bilaterally without adventitious breath sounds Heart: regular rate and rhythm.   Abdomen: soft, non-distended; normal active bowel sounds; mild diffuse TTP; no guarding Back: no CVA tenderness Extremities: no edema; symmetrical with no gross deformities Skin: warm and dry Neurologic: normal gait Psychological: alert and cooperative; normal mood and affect  ASSESSMENT & PLAN:  1. Non-intractable vomiting with nausea, unspecified vomiting type   2. Food poisoning   3. Diarrhea, unspecified type     Meds ordered this encounter  Medications  . ondansetron (ZOFRAN) 4 MG tablet    Sig: Take 1 tablet (4 mg total) by mouth every 6 (six) hours.    Dispense:  12 tablet    Refill:  0    Order Specific Question:   Supervising Provider    Answer:    Eustace Moore [[id number]]   Get rest and drink fluids Zofran prescribed.  Take as directed.    DIET Instructions:  30 minutes after taking nausea medicine, begin with sips of clear liquids. If able to hold down 2 - 4 ounces for 30 minutes, begin drinking more. Increase your fluid intake to replace losses. Clear liquids only for 24 hours (water, tea, sport drinks, clear flat ginger ale or cola and juices, broth, jello, popsicles, ect). Advance to bland foods, applesauce, rice, baked or  boiled chicken, ect. Avoid milk, greasy foods and anything that doesn't agree with you.  If you experience new or worsening symptoms return or go to ER such as fever, chills, nausea, vomiting, diarrhea, bloody or dark tarry stools, constipation, urinary symptoms, worsening abdominal discomfort, symptoms that do not improve with medications, inability to keep fluids down, etc...  Reviewed expectations re: course of current medical issues. Questions answered. Outlined signs and symptoms indicating need for more acute intervention. Patient verbalized understanding. After Visit Summary given.   Rennis Harding, PA-C 04/27/20 1203

## 2020-05-13 ENCOUNTER — Ambulatory Visit: Payer: BC Managed Care – PPO | Attending: Internal Medicine

## 2020-05-13 DIAGNOSIS — Z23 Encounter for immunization: Secondary | ICD-10-CM

## 2020-05-13 NOTE — Progress Notes (Signed)
   Covid-19 Vaccination Clinic  Name:  TARRENCE ENCK    MRN: [id number] DOB: [birth year]  05/13/2020  Mr. Faulkenberry was observed post Covid-19 immunization for 15 minutes without incident. He was provided with Vaccine Information Sheet and instruction to access the V-Safe system.   Mr. Covello was instructed to call 911 with any severe reactions post vaccine: Marland Kitchen Difficulty breathing  . Swelling of face and throat  . A fast heartbeat  . A bad rash all over body  . Dizziness and weakness   Immunizations Administered    Name Date Dose VIS Date Route   Pfizer COVID-19 Vaccine 05/13/2020 11:47 AM 0.3 mL 11/05/2018 Intranasal   Manufacturer: [employer], [employer]   Lot: [id number]   NDC: [address]-0

## 2020-06-24 ENCOUNTER — Emergency Department (HOSPITAL_COMMUNITY)
Admission: EM | Admit: 2020-06-24 | Discharge: 2020-06-24 | Disposition: A | Discharge status: Home / Self Care | Payer: BC Managed Care – PPO | Attending: Emergency Medicine | Admitting: Emergency Medicine

## 2020-06-24 ENCOUNTER — Encounter (HOSPITAL_COMMUNITY): Payer: Self-pay

## 2020-06-24 ENCOUNTER — Other Ambulatory Visit: Payer: Self-pay

## 2020-06-24 DIAGNOSIS — Z20822 Contact with and (suspected) exposure to covid-19: Secondary | ICD-10-CM | POA: Diagnosis not present

## 2020-06-24 DIAGNOSIS — I10 Essential (primary) hypertension: Secondary | ICD-10-CM | POA: Diagnosis not present

## 2020-06-24 DIAGNOSIS — Z7982 Long term (current) use of aspirin: Secondary | ICD-10-CM | POA: Insufficient documentation

## 2020-06-24 DIAGNOSIS — F17210 Nicotine dependence, cigarettes, uncomplicated: Secondary | ICD-10-CM | POA: Insufficient documentation

## 2020-06-24 DIAGNOSIS — R42 Dizziness and giddiness: Secondary | ICD-10-CM | POA: Diagnosis present

## 2020-06-24 DIAGNOSIS — B349 Viral infection, unspecified: Secondary | ICD-10-CM | POA: Diagnosis not present

## 2020-06-24 DIAGNOSIS — Z79899 Other long term (current) drug therapy: Secondary | ICD-10-CM | POA: Diagnosis not present

## 2020-06-24 LAB — RESPIRATORY PANEL BY RT PCR (FLU A&B, COVID)
Influenza A by PCR: NEGATIVE
Influenza B by PCR: NEGATIVE
SARS Coronavirus 2 by RT PCR: NEGATIVE

## 2020-06-24 LAB — CBC WITH DIFFERENTIAL/PLATELET
Abs Immature Granulocytes: 0.02 10*3/uL (ref 0.00–0.07)
Basophils Absolute: 0 10*3/uL (ref 0.0–0.1)
Basophils Relative: 0 %
Eosinophils Absolute: 0.1 10*3/uL (ref 0.0–0.5)
Eosinophils Relative: 1 %
HCT: 40.7 % (ref 39.0–52.0)
Hemoglobin: 14.2 g/dL (ref 13.0–17.0)
Immature Granulocytes: 0 %
Lymphocytes Relative: 25 %
Lymphs Abs: 2.2 10*3/uL (ref 0.7–4.0)
MCH: 32.9 pg (ref 26.0–34.0)
MCHC: 34.9 g/dL (ref 30.0–36.0)
MCV: 94.2 fL (ref 80.0–100.0)
Monocytes Absolute: 0.5 10*3/uL (ref 0.1–1.0)
Monocytes Relative: 6 %
Neutro Abs: 5.9 10*3/uL (ref 1.7–7.7)
Neutrophils Relative %: 68 %
Platelets: 243 10*3/uL (ref 150–400)
RBC: 4.32 MIL/uL (ref 4.22–5.81)
RDW: 12.8 % (ref 11.5–15.5)
WBC: 8.6 10*3/uL (ref 4.0–10.5)
nRBC: 0 % (ref 0.0–0.2)

## 2020-06-24 LAB — URINALYSIS, ROUTINE W REFLEX MICROSCOPIC
Bilirubin Urine: NEGATIVE
Glucose, UA: NEGATIVE mg/dL
Hgb urine dipstick: NEGATIVE
Ketones, ur: NEGATIVE mg/dL
Leukocytes,Ua: NEGATIVE
Nitrite: NEGATIVE
Protein, ur: NEGATIVE mg/dL
Specific Gravity, Urine: 1.008 (ref 1.005–1.030)
pH: 7 (ref 5.0–8.0)

## 2020-06-24 LAB — COMPREHENSIVE METABOLIC PANEL
ALT: 23 U/L (ref 0–44)
AST: 30 U/L (ref 15–41)
Albumin: 4.4 g/dL (ref 3.5–5.0)
Alkaline Phosphatase: 61 U/L (ref 38–126)
Anion gap: 13 (ref 5–15)
BUN: 10 mg/dL (ref 6–20)
CO2: 26 mmol/L (ref 22–32)
Calcium: 9.7 mg/dL (ref 8.9–10.3)
Chloride: 97 mmol/L — ABNORMAL LOW (ref 98–111)
Creatinine, Ser: 0.85 mg/dL (ref 0.61–1.24)
GFR, Estimated: >60 mL/min (ref >60)
Glucose, Bld: 118 mg/dL — ABNORMAL HIGH (ref 70–99)
Potassium: 3.4 mmol/L — ABNORMAL LOW (ref 3.5–5.1)
Sodium: 136 mmol/L (ref 135–145)
Total Bilirubin: 0.5 mg/dL (ref 0.3–1.2)
Total Protein: 8.1 g/dL (ref 6.5–8.1)

## 2020-06-24 MED ORDER — SODIUM CHLORIDE 0.9 % IV BOLUS
500.0000 mL | Freq: Once | INTRAVENOUS | Status: AC
  Administered 2020-06-24: 500 mL via INTRAVENOUS

## 2020-06-24 NOTE — ED Notes (Signed)
Pt upset and angry  IV is out fo hand and pt states tech pulled it out while doing an EKG, told him he would tell someone and left   (this nurse not told)  Pt apologized to, IV stopped, and IV site wound dressed

## 2020-06-24 NOTE — ED Triage Notes (Addendum)
Pt presents to the ED with dizziness, headache, diarrhea that started yesterday. Pt says diarrhea has eased off, but pt still has bad headache and feels dizzy. Pt NIHHS is 0 at this time. Pt also reports tightness and stiffness to neck. Blurry vision that comes and goes. Pt says several people at work have been out with [employer]

## 2020-06-24 NOTE — Discharge Instructions (Signed)
It is important you stay hydrated, drink plenty of water. You can treat your pain with over-the-counter medications such as Tylenol or ibuprofen. Please follow-up with your primary care provider symptoms persist.

## 2020-06-24 NOTE — ED Provider Notes (Signed)
[facility] EMERGENCY DEPARTMENT Provider Note   CSN: [id number] Arrival date & time: 06/24/20  2027     History Chief Complaint  Patient presents with  . Dizziness  . Headache    HOY FALLERT is a 59 y.o. male presenting to the emergency department with complaint of illness that began yesterday.  He states he began having fatigue, nausea, diarrhea, headache and intermittent dizziness yesterday.  Diarrhea has resolved.  The dizziness is not positional.  He states he may be urinating more frequently though no associated dysuria.  He denies associated cough, abdominal pain, loss of taste or smell or fever.  He is treated his symptoms with extra strength Tylenol.  His coworkers have been out with Covid, however he has completed [employer] vaccines.   The history is provided by the patient.       Past Medical History:  Diagnosis Date  . Anginal pain (HCC)   . Hepatitis B 1991  . History of alcohol abuse   . History of cocaine abuse (HCC)   . Hypercholesterolemia   . Hypertension   . Mood disorder (HCC)    NOS. Admitted to [facility] for detox in 12/2005  . Stroke (HCC) 08/05/2012   denies residual (08/05/2012)  . Tobacco abuse     Patient Active Problem List   Diagnosis Date Noted  . Chest pain 01/23/2019  . Abdominal pain 01/23/2019  . Hypertensive urgency 01/23/2019  . H/O: CVA (cerebrovascular accident) 01/23/2019  . Stroke (HCC) 08/05/2012  . Hypertension 08/05/2012  . Tobacco abuse 08/05/2012  . History of cocaine abuse (HCC)   . History of alcohol abuse     Past Surgical History:  Procedure Laterality Date  . MOUTH SURGERY  2011   "had to cut tooth out of my gum" (08/05/2012)  . TEE WITHOUT CARDIOVERSION  08/07/2012   Procedure: TRANSESOPHAGEAL ECHOCARDIOGRAM (TEE);  Surgeon: Vesta Mixer, MD;  Location: [facility] ENDOSCOPY;  Service: Cardiovascular;  Laterality: N/A;       Family History  Problem Relation Age of Onset  . Hypertension Mother   . Osteoarthritis  Mother   . Hyperlipidemia Mother   . Prostate cancer Father   . CAD Maternal Grandmother   . Lung cancer Paternal Grandfather        tobacco abuse    Social History   Tobacco Use  . Smoking status: Current Every Day Smoker    Packs/day: 1.50    Years: 35.00    Pack years: 52.50    Types: Cigarettes  . Smokeless tobacco: Current User    Types: Snuff  Vaping Use  . Vaping Use: Never used  Substance Use Topics  . Alcohol use: Yes    Alcohol/week: 2.0 standard drinks    Types: 2 Cans of beer per week    Comment: 1-2 beers a week / previously drank heavily until 2009 6 pack or more of beer daily.  . Drug use: Yes    Types: Marijuana, Heroin    Comment: stopped in [facility], smoked cocaine, some heroin (quit in 2011); last UDS 03/2012 + for cocaine/resident note 08/05/2012    Home Medications Prior to Admission medications   Medication Sig Start Date End Date Taking? Authorizing Provider  amLODipine (NORVASC) 10 MG tablet Take 1 tablet (10 mg total) by mouth daily. 01/25/19   Calvert Cantor, MD  aspirin 325 MG tablet Take 325 mg by mouth daily.    [provider]  benzonatate (TESSALON) 100 MG capsule Take 1 capsule (  100 mg total) by mouth every 8 (eight) hours. 02/24/19   Wurst, [place], PA-C  bisoprolol-hydrochlorothiazide (ZIAC) 5-6.25 MG tablet Take 1 tablet by mouth daily. 01/24/19   Calvert Cantor, MD  cetirizine (ZYRTEC) 10 MG chewable tablet Chew 1 tablet (10 mg total) by mouth daily. 02/24/19   Wurst, [place], PA-C  fluticasone (FLONASE) 50 MCG/ACT nasal spray Place 2 sprays into both nostrils daily. 02/24/19   Wurst, [place], PA-C  ondansetron (ZOFRAN) 4 MG tablet Take 1 tablet (4 mg total) by mouth every 6 (six) hours. 04/27/20   Wurst, [place], PA-C  pantoprazole (PROTONIX) 40 MG tablet Take 1 tablet (40 mg total) by mouth 2 (two) times daily before a meal. 01/24/19   Calvert Cantor, MD  rosuvastatin (CRESTOR) 10 MG tablet Take 1 tablet (10 mg total) by mouth daily. 01/25/19    Calvert Cantor, MD    Allergies    Lisinopril and Doxycycline  Review of Systems   Review of Systems  All other systems reviewed and are negative.   Physical Exam Updated Vital Signs BP (!) 158/92 (BP Location: Right Arm)   Pulse 72   Temp 97.7 F (36.5 C) (Oral)   Resp 16   Ht 5\' 7"  (1.702 m)   Wt 81.6 kg   SpO2 98%   BMI 28.19 kg/m   Physical Exam Vitals and nursing note reviewed.  Constitutional:      General: He is not in acute distress.    Appearance: He is well-developed. He is not ill-appearing.  HENT:     Head: Normocephalic and atraumatic.  Eyes:     Extraocular Movements: Extraocular movements intact.     Conjunctiva/sclera: Conjunctivae normal.     Pupils: Pupils are equal, round, and reactive to light.  Cardiovascular:     Rate and Rhythm: Normal rate and regular rhythm.  Pulmonary:     Effort: Pulmonary effort is normal. No respiratory distress.     Breath sounds: Normal breath sounds.  Abdominal:     General: Bowel sounds are normal.     Palpations: Abdomen is soft.     Tenderness: There is no abdominal tenderness.  Skin:    General: Skin is warm.  Neurological:     Mental Status: He is alert.     Comments: Mental Status:  Alert, oriented, thought content appropriate, able to give a coherent history. Speech fluent without evidence of aphasia. Able to follow 2 step commands without difficulty.  Cranial Nerves grossly intact. Motor:  Normal tone. 5/5 strength in upper and lower extremities bilaterally including strong and equal grip strength and dorsiflexion/plantar flexion Sensory: grossly normal in all extremities.  Cerebellar: normal finger-to-nose with bilateral upper extremities Gait: normal gait and balance CV: distal pulses palpable throughout    Psychiatric:        Behavior: Behavior normal.     ED Results / Procedures / Treatments   Labs (all labs ordered are listed, but only abnormal results are displayed) Labs Reviewed    COMPREHENSIVE METABOLIC PANEL - Abnormal; Notable for the following components:      Result Value   Potassium 3.4 (*)    Chloride 97 (*)    Glucose, Bld 118 (*)    All other components within normal limits  URINALYSIS, ROUTINE W REFLEX MICROSCOPIC - Abnormal; Notable for the following components:   Color, Urine STRAW (*)    All other components within normal limits  RESPIRATORY PANEL BY RT PCR (FLU A&B, COVID)  CBC WITH DIFFERENTIAL/PLATELET  EKG None  Radiology No results found.  Procedures Procedures (including critical care time)  Medications Ordered in ED Medications  sodium chloride 0.9 % bolus 500 mL (0 mLs Intravenous Stopped 06/24/20 2247)    ED Course  I have reviewed the triage vital signs and the nursing notes.  Pertinent labs & imaging results that were available during my care of the patient were reviewed by me and considered in my medical decision making (see chart for details).  JOSIAS TOMERLIN was evaluated in Emergency Department on 06/24/2020 for the symptoms described in the history of present illness. He was evaluated in the context of the global COVID-19 pandemic, which necessitated consideration that the patient might be at risk for infection with the SARS-CoV-2 virus that causes COVID-19. Institutional protocols and algorithms that pertain to the evaluation of patients at risk for COVID-19 are in a state of rapid change based on information released by regulatory bodies including the CDC and federal and state organizations. These policies and algorithms were followed during the patient's care in the ED.    MDM Rules/Calculators/A&P                          Patient presenting with symptoms consistent with likely viral illness that began yesterday.  He is afebrile and in no distress with stable vital signs.  His EKG is unchanged from baseline.  Labs are very reassuring with no white count, no anemia.  UA is negative.  Metabolic panel is for acute  significant abnormalities.  Negative Covid and flu swabs.  Patient is given IV fluids for symptoms without recurrence of dizziness.  Recommend continued symptomatic management and PCP follow-up as needed.  return precautions discussed.  Patient is appropriate for discharge at this time.  Final Clinical Impression(s) / ED Diagnoses Final diagnoses:  Viral illness    Rx / DC Orders ED Discharge Orders    None       Acelynn Dejonge, [place] N, PA-C 06/24/20 2327    Bethann Berkshire, MD 06/25/20 1121

## 2020-06-24 NOTE — ED Notes (Signed)
Multiple complaints:  Dizzy, stiff neck, recent diarrhea  Recent covid outbreak at work   Here for eval

## 2020-09-15 ENCOUNTER — Other Ambulatory Visit: Payer: Self-pay

## 2020-09-15 ENCOUNTER — Emergency Department (HOSPITAL_COMMUNITY): Payer: BC Managed Care – PPO

## 2020-09-15 ENCOUNTER — Emergency Department (HOSPITAL_COMMUNITY)
Admission: EM | Admit: 2020-09-15 | Discharge: 2020-09-15 | Disposition: A | Discharge status: Home / Self Care | Payer: BC Managed Care – PPO | Attending: Emergency Medicine | Admitting: Emergency Medicine

## 2020-09-15 ENCOUNTER — Encounter (HOSPITAL_COMMUNITY): Payer: Self-pay | Admitting: Emergency Medicine

## 2020-09-15 DIAGNOSIS — R059 Cough, unspecified: Secondary | ICD-10-CM | POA: Diagnosis present

## 2020-09-15 DIAGNOSIS — U071 COVID-19: Secondary | ICD-10-CM | POA: Diagnosis not present

## 2020-09-15 DIAGNOSIS — Z79899 Other long term (current) drug therapy: Secondary | ICD-10-CM | POA: Diagnosis not present

## 2020-09-15 DIAGNOSIS — F17210 Nicotine dependence, cigarettes, uncomplicated: Secondary | ICD-10-CM | POA: Insufficient documentation

## 2020-09-15 DIAGNOSIS — Z7982 Long term (current) use of aspirin: Secondary | ICD-10-CM | POA: Diagnosis not present

## 2020-09-15 DIAGNOSIS — I10 Essential (primary) hypertension: Secondary | ICD-10-CM | POA: Insufficient documentation

## 2020-09-15 DIAGNOSIS — Z20822 Contact with and (suspected) exposure to covid-19: Secondary | ICD-10-CM

## 2020-09-15 LAB — RESP PANEL BY RT-PCR (FLU A&B, COVID) ARPGX2
Influenza A by PCR: NEGATIVE
Influenza B by PCR: NEGATIVE
SARS Coronavirus 2 by RT PCR: POSITIVE — AB

## 2020-09-15 MED ORDER — ALBUTEROL SULFATE HFA 108 (90 BASE) MCG/ACT IN AERS: 1.0000 | INHALATION_SPRAY | Freq: Four times a day (QID) | RESPIRATORY_TRACT | 0 refills | Status: DC | PRN

## 2020-09-15 MED ORDER — GUAIFENESIN ER 600 MG PO TB12: 600.0000 mg | ORAL_TABLET | Freq: Two times a day (BID) | ORAL | 0 refills | Status: DC

## 2020-09-15 MED ORDER — BENZONATATE 100 MG PO CAPS: 100.0000 mg | ORAL_CAPSULE | Freq: Three times a day (TID) | ORAL | 0 refills | Status: AC

## 2020-09-15 NOTE — ED Provider Notes (Signed)
[facility] EMERGENCY DEPARTMENT Provider Note   CSN: [id number] Arrival date & time: 09/15/20  1237     History Chief Complaint  Patient presents with  . Cough    [name] is a 60 y.o. male.  HPI   60 year old male with history of anginal pain, hep C, alcohol abuse, cocaine abuse, hypercholesterolemia, hypertension, mood disorder, CVA, tobacco abuse, who presents to the emergency department today for evaluation of URI symptoms.  States for the last few days he has had congestion, chills, body aches, cough and shortness of breath.  He has been fully vaccinated against Covid but states that he has had several positive exposures recently.  Past Medical History:  Diagnosis Date  . Anginal pain (HCC)   . Hepatitis B 1991  . History of alcohol abuse   . History of cocaine abuse (HCC)   . Hypercholesterolemia   . Hypertension   . Mood disorder (HCC)    NOS. Admitted to [facility] for detox in 12/2005  . Stroke (HCC) 08/05/2012   denies residual (08/05/2012)  . Tobacco abuse     Patient Active Problem List   Diagnosis Date Noted  . Chest pain 01/23/2019  . Abdominal pain 01/23/2019  . Hypertensive urgency 01/23/2019  . H/O: CVA (cerebrovascular accident) 01/23/2019  . Stroke (HCC) 08/05/2012  . Hypertension 08/05/2012  . Tobacco abuse 08/05/2012  . History of cocaine abuse (HCC)   . History of alcohol abuse     Past Surgical History:  Procedure Laterality Date  . MOUTH SURGERY  2011   "had to cut tooth out of my gum" (08/05/2012)  . TEE WITHOUT CARDIOVERSION  08/07/2012   Procedure: TRANSESOPHAGEAL ECHOCARDIOGRAM (TEE);  Surgeon: Vesta Mixer, MD;  Location: [facility] ENDOSCOPY;  Service: Cardiovascular;  Laterality: N/A;       Family History  Problem Relation Age of Onset  . Hypertension Mother   . Osteoarthritis Mother   . Hyperlipidemia Mother   . Prostate cancer Father   . CAD Maternal Grandmother   . Lung cancer Paternal Grandfather        tobacco abuse     Social History   Tobacco Use  . Smoking status: Current Every Day Smoker    Packs/day: 1.50    Years: 35.00    Pack years: 52.50    Types: Cigarettes  . Smokeless tobacco: Current User    Types: Snuff  Vaping Use  . Vaping Use: Never used  Substance Use Topics  . Alcohol use: Yes    Alcohol/week: 2.0 standard drinks    Types: 2 Cans of beer per week    Comment: 1-2 beers a week / previously drank heavily until 2009 6 pack or more of beer daily.  . Drug use: Yes    Frequency: 7.0 times per week    Types: Marijuana, Heroin    Comment: stopped in [facility], smoked cocaine, some heroi0n (quit in 2011); last UDS 03/2012 + for cocaine/resident note 08/05/2012    Home Medications Prior to Admission medications   Medication Sig Start Date End Date Taking? Authorizing Provider  albuterol (VENTOLIN HFA) 108 (90 Base) MCG/ACT inhaler Inhale 1-2 puffs into the lungs every 6 (six) hours as needed for wheezing or shortness of breath. 09/15/20  Yes Manning Luna S, PA-C  benzonatate (TESSALON) 100 MG capsule Take 1 capsule (100 mg total) by mouth every 8 (eight) hours for 5 days. 09/15/20 09/20/20 Yes Mailee Klaas S, PA-C  guaiFENesin (MUCINEX) 600 MG 12 hr tablet Take  1 tablet (600 mg total) by mouth 2 (two) times daily. 09/15/20  Yes Jaquavian Firkus S, PA-C  amLODipine (NORVASC) 10 MG tablet Take 1 tablet (10 mg total) by mouth daily. 01/25/19   Debbe Odea, MD  aspirin 325 MG tablet Take 325 mg by mouth daily.    [provider]  bisoprolol-hydrochlorothiazide (ZIAC) 5-6.25 MG tablet Take 1 tablet by mouth daily. 01/24/19   Debbe Odea, MD  cetirizine (ZYRTEC) 10 MG chewable tablet Chew 1 tablet (10 mg total) by mouth daily. 02/24/19   Wurst, [place], PA-C  fluticasone (FLONASE) 50 MCG/ACT nasal spray Place 2 sprays into both nostrils daily. 02/24/19   Wurst, [place], PA-C  ondansetron (ZOFRAN) 4 MG tablet Take 1 tablet (4 mg total) by mouth every 6 (six) hours. 04/27/20   Wurst,  [place], PA-C  pantoprazole (PROTONIX) 40 MG tablet Take 1 tablet (40 mg total) by mouth 2 (two) times daily before a meal. 01/24/19   Debbe Odea, MD  rosuvastatin (CRESTOR) 10 MG tablet Take 1 tablet (10 mg total) by mouth daily. 01/25/19   Debbe Odea, MD    Allergies    Lisinopril and Doxycycline  Review of Systems   Review of Systems  Constitutional: Positive for chills, diaphoresis and fever.  HENT: Positive for congestion. Negative for ear pain and sore throat.   Eyes: Negative for pain and visual disturbance.  Respiratory: Positive for cough and shortness of breath.   Cardiovascular: Negative for chest pain and palpitations.  Gastrointestinal: Negative for abdominal pain, constipation, diarrhea, nausea and vomiting.  Genitourinary: Negative for dysuria and hematuria.  Musculoskeletal: Positive for myalgias. Negative for back pain.  Skin: Negative for color change and rash.  Neurological: Positive for headaches. Negative for seizures and syncope.  All other systems reviewed and are negative.   Physical Exam Updated Vital Signs BP (!) 177/122 (BP Location: Right Arm)   Pulse 63   Temp 98.6 F (37 C) (Oral)   Resp 16   Ht 5\' 7"  (1.702 m)   Wt 83.9 kg   SpO2 100%   BMI 28.98 kg/m   Physical Exam Vitals and nursing note reviewed.  Constitutional:      Appearance: He is well-developed and well-nourished.  HENT:     Head: Normocephalic and atraumatic.  Eyes:     Conjunctiva/sclera: Conjunctivae normal.  Cardiovascular:     Rate and Rhythm: Normal rate and regular rhythm.     Pulses: Normal pulses.     Heart sounds: Normal heart sounds. No murmur heard.   Pulmonary:     Effort: Pulmonary effort is normal. No respiratory distress.     Breath sounds: Normal breath sounds. No wheezing, rhonchi or rales.  Abdominal:     General: Bowel sounds are normal.     Palpations: Abdomen is soft.     Tenderness: There is no abdominal tenderness. There is no guarding or  rebound.  Musculoskeletal:        General: No edema.     Cervical back: Neck supple.  Skin:    General: Skin is warm and dry.  Neurological:     Mental Status: He is alert.  Psychiatric:        Mood and Affect: Mood and affect normal.     ED Results / Procedures / Treatments   Labs (all labs ordered are listed, but only abnormal results are displayed) Labs Reviewed  RESP PANEL BY RT-PCR (FLU A&B, COVID) [id number]    EKG None  Radiology DG Chest  Portable 1 View  Result Date: 09/15/2020 CLINICAL DATA:  Cough EXAM: PORTABLE CHEST 1 VIEW COMPARISON:  01/23/2019 FINDINGS: Cardiac enlargement without heart failure. Lungs are well aerated and clear without infiltrate or effusion. IMPRESSION: No active disease. Electronically Signed   By: Marlan Palau M.D.   On: 09/15/2020 14:23    Procedures Procedures (including critical care time)  Medications Ordered in ED Medications - No data to display  ED Course  I have reviewed the triage vital signs and the nursing notes.  Pertinent labs & imaging results that were available during my care of the patient were reviewed by me and considered in my medical decision making (see chart for details).    MDM Rules/Calculators/A&P                          Patient presenting for evaluation for Covid.  Reports symptoms ongoing for several days.  Patient nontoxic, well-appearing, no distress.  Vital signs are reassuring.  Chest x-ray negative for pna, ptx.  Tested for Covid in the ED. Results pending at the time of discharge. Advised on quarantine measures. Will give Rx for symptomatic management. Advised on f/u and return precautions. Pt voiced understanding of the plan and reasons to return. All questions answered, pt stable for d/c.  RUDI BUNYARD was evaluated in Emergency Department on 09/15/2020 for the symptoms described in the history of present illness. He was evaluated in the context of the global COVID-19 pandemic, which necessitated  consideration that the patient might be at risk for infection with the SARS-CoV-2 virus that causes COVID-19. Institutional protocols and algorithms that pertain to the evaluation of patients at risk for COVID-19 are in a state of rapid change based on information released by regulatory bodies including the CDC and federal and state organizations. These policies and algorithms were followed during the patient's care in the ED.   Final Clinical Impression(s) / ED Diagnoses Final diagnoses:  Person under investigation for COVID-19    Rx / DC Orders ED Discharge Orders         Ordered    benzonatate (TESSALON) 100 MG capsule  Every 8 hours        09/15/20 1446    albuterol (VENTOLIN HFA) 108 (90 Base) MCG/ACT inhaler  Every 6 hours PRN        09/15/20 1446    guaiFENesin (MUCINEX) 600 MG 12 hr tablet  2 times daily        09/15/20 [address], Javonn Gauger S, PA-C 09/15/20 1448    Bethann Berkshire, MD 09/16/20 1038

## 2020-09-15 NOTE — ED Triage Notes (Addendum)
Pt complains of Covid Sx that began 09/12/20. Pt reports known exposure.   Pt is hypertensive in triage. Pt report he is prescribed meds for Htn, but has not taken it today.

## 2020-09-15 NOTE — Discharge Instructions (Signed)
Your chest x-ray did not show any evidence of pneumonia, collapsed lung or other emergent abnormality.  Your Covid test is pending at the time of your discharge.  You can follow-up on this result on your my chart.  If it is positive, You should be isolated for at least 7 days since the onset of your symptoms AND >72 hours after symptoms resolution (absence of fever without the use of fever reducing medicaiton and improvement in respiratory symptoms), whichever is longer  You are given cough medication and inhaler to help with your symptoms.  You are also given Mucinex to help with your chest and nasal congestion.  Please take all medications as directed.  Please follow up with your primary care provider within 5-7 days for re-evaluation of your symptoms. If you do not have a primary care provider, information for a healthcare clinic has been provided for you to make arrangements for follow up care. Please return to the emergency department for any new or worsening symptoms.

## 2020-09-15 NOTE — ED Notes (Signed)
CRITICAL VALUE ALERT  Critical Value:  Covid Positive  Date & Time Notied:  09/15/20 1710  Provider Notified: Dr. Denton Lank  Orders Received/Actions taken: EDP notified

## 2020-12-07 ENCOUNTER — Ambulatory Visit: Payer: BC Managed Care – PPO | Admitting: Neurology

## 2021-02-10 ENCOUNTER — Encounter (HOSPITAL_COMMUNITY): Payer: Self-pay

## 2021-02-10 ENCOUNTER — Other Ambulatory Visit: Payer: Self-pay

## 2021-02-10 ENCOUNTER — Emergency Department (HOSPITAL_COMMUNITY)
Admission: EM | Admit: 2021-02-10 | Discharge: 2021-02-10 | Disposition: A | Discharge status: Home / Self Care | Payer: Worker's Compensation | Attending: Emergency Medicine | Admitting: Emergency Medicine

## 2021-02-10 DIAGNOSIS — I10 Essential (primary) hypertension: Secondary | ICD-10-CM | POA: Diagnosis not present

## 2021-02-10 DIAGNOSIS — Z01812 Encounter for preprocedural laboratory examination: Secondary | ICD-10-CM | POA: Diagnosis present

## 2021-02-10 DIAGNOSIS — F17210 Nicotine dependence, cigarettes, uncomplicated: Secondary | ICD-10-CM | POA: Diagnosis not present

## 2021-02-10 DIAGNOSIS — Z0189 Encounter for other specified special examinations: Secondary | ICD-10-CM

## 2021-02-10 LAB — ETHANOL: Alcohol, Ethyl (B): 130 mg/dL — ABNORMAL HIGH (ref <10)

## 2021-02-10 NOTE — Discharge Instructions (Signed)
You were seen in the emerge department today with request for a lab test to be drawn.  I have attached the results to your paperwork here.  Please establish care with a primary care doctor.  Return to the emergency department any new or suddenly worsening symptoms.

## 2021-02-10 NOTE — ED Provider Notes (Signed)
Emergency Department Provider Note   I have reviewed the triage vital signs and the nursing notes.   HISTORY  Chief Complaint Medical Clearance   HPI [name] is a 60 y.o. male with past medical history reviewed below presents to the emergency department with his supervisor at bedside for a alcohol level to be drawn.  The patient's work thinks that he may have shown up to work intoxicated.  Patient will neither confirm or deny this.  He is not having any physical plaints at this time.  Patient tells me that he understands that this is a voluntary blood draw and this is being requested but not required.  Patient tells me he understands that the results are his alone and he can share them if he wishes.    Past Medical History:  Diagnosis Date  . Anginal pain (HCC)   . Hepatitis B 1991  . History of alcohol abuse   . History of cocaine abuse (HCC)   . Hypercholesterolemia   . Hypertension   . Mood disorder (HCC)    NOS. Admitted to [facility] for detox in 12/2005  . Stroke (HCC) 08/05/2012   denies residual (08/05/2012)  . Tobacco abuse     Patient Active Problem List   Diagnosis Date Noted  . Chest pain 01/23/2019  . Abdominal pain 01/23/2019  . Hypertensive urgency 01/23/2019  . H/O: CVA (cerebrovascular accident) 01/23/2019  . Stroke (HCC) 08/05/2012  . Hypertension 08/05/2012  . Tobacco abuse 08/05/2012  . History of cocaine abuse (HCC)   . History of alcohol abuse     Past Surgical History:  Procedure Laterality Date  . MOUTH SURGERY  2011   "had to cut tooth out of my gum" (08/05/2012)  . TEE WITHOUT CARDIOVERSION  08/07/2012   Procedure: TRANSESOPHAGEAL ECHOCARDIOGRAM (TEE);  Surgeon: Vesta Mixer, MD;  Location: [facility] ENDOSCOPY;  Service: Cardiovascular;  Laterality: N/A;    Allergies Lisinopril and Doxycycline  Family History  Problem Relation Age of Onset  . Hypertension Mother   . Osteoarthritis Mother   . Hyperlipidemia Mother   . Prostate cancer  Father   . CAD Maternal Grandmother   . Lung cancer Paternal Grandfather        tobacco abuse    Social History Social History   Tobacco Use  . Smoking status: Current Every Day Smoker    Packs/day: 1.50    Years: 35.00    Pack years: 52.50    Types: Cigarettes  . Smokeless tobacco: Current User    Types: Snuff  Vaping Use  . Vaping Use: Never used  Substance Use Topics  . Alcohol use: Yes    Alcohol/week: 2.0 standard drinks    Types: 2 Cans of beer per week    Comment: 1-2 beers a week / previously drank heavily until 2009 6 pack or more of beer daily.  . Drug use: Yes    Frequency: 7.0 times per week    Types: Marijuana, Heroin    Comment: stopped in [facility], smoked cocaine, some heroi0n (quit in 2011); last UDS 03/2012 + for cocaine/resident note 08/05/2012    Review of Systems  Constitutional: No fever/chills Eyes: No visual changes. ENT: No sore throat. Cardiovascular: Denies chest pain. Respiratory: Denies shortness of breath. Gastrointestinal: No abdominal pain.  No nausea, no vomiting.  No diarrhea.  No constipation. Genitourinary: Negative for dysuria. Musculoskeletal: Negative for back pain. Skin: Negative for rash. Neurological: Negative for headaches, focal weakness or numbness.  10-point  ROS otherwise negative.  ____________________________________________   PHYSICAL EXAM:  VITAL SIGNS: ED Triage Vitals  Enc Vitals Group     BP 02/10/21 0730 (!) 153/91     Pulse Rate 02/10/21 0730 84     Resp 02/10/21 0735 15     Temp 02/10/21 0735 99.1 F (37.3 C)     Temp Source 02/10/21 0735 Oral     SpO2 02/10/21 0730 97 %     Weight 02/10/21 0729 180 lb (81.6 kg)     Height 02/10/21 0729 5\' 7"  (1.702 m)   Constitutional: Alert and oriented. Well appearing and in no acute distress. Eyes: Conjunctivae are normal. Head: Atraumatic. Nose: No congestion/rhinnorhea. Mouth/Throat: Mucous membranes are moist.  Neck: No stridor.  Cardiovascular: Normal rate,  regular rhythm. Good peripheral circulation. Grossly normal heart sounds.   Respiratory: Normal respiratory effort.  No retractions. Lungs CTAB. Gastrointestinal: Soft and nontender. No distention.  Musculoskeletal: No gross deformities of extremities. Neurologic:  Normal speech and language.  Skin:  Skin is warm, dry and intact. No rash noted.  ____________________________________________   LABS (all labs ordered are listed, but only abnormal results are displayed)  Labs Reviewed  ETHANOL - Abnormal; Notable for the following components:      Result Value   Alcohol, Ethyl (B) 130 (*)    All other components within normal limits   ____________________________________________   PROCEDURES  Procedure(s) performed:   Procedures  None ____________________________________________   INITIAL IMPRESSION / ASSESSMENT AND PLAN / ED COURSE  Pertinent labs & imaging results that were available during my care of the patient were reviewed by me and considered in my medical decision making (see chart for details).   Patient arrives to the emergency department requesting an alcohol level so that he can provide this to his employer.  He is not under arrest.  I do not have a warrant or other order to force compliance.  I discussed this with the patient that the results will be his and his alone.  He can choose to share that or not.  We also discussed that he could decide to not be tested today.  Patient understands this.  He has no medical complaints at this time.  He does tell me that he is going through "a lot" right now. Denies any SI/HI.   Lab test reviewed and results provided to patient. Plan for d/c. He is ambulatory with a  Steady gait. He is has a ride home.  ____________________________________________  FINAL CLINICAL IMPRESSION(S) / ED DIAGNOSES  Final diagnoses:  Laboratory test    Note:  This document was prepared using Dragon voice recognition software and may include  unintentional dictation errors.  , MD, [facility] Emergency Medicine    Demarius Archila, [facility], MD 02/10/21 [phone]

## 2021-02-10 NOTE — ED Triage Notes (Signed)
Pt arrived with his supervisor from work , [employer] to be tested for alcohol.

## 2021-03-15 ENCOUNTER — Other Ambulatory Visit: Payer: Self-pay

## 2021-03-15 ENCOUNTER — Other Ambulatory Visit (HOSPITAL_COMMUNITY)
Admission: RE | Admit: 2021-03-15 | Discharge: 2021-03-15 | Disposition: A | Discharge status: Home / Self Care | Payer: Self-pay | Source: Ambulatory Visit | Attending: Internal Medicine | Admitting: Internal Medicine

## 2021-03-15 ENCOUNTER — Other Ambulatory Visit (HOSPITAL_COMMUNITY): Payer: Self-pay | Admitting: Gerontology

## 2021-03-15 ENCOUNTER — Ambulatory Visit (HOSPITAL_COMMUNITY)
Admission: RE | Admit: 2021-03-15 | Discharge: 2021-03-15 | Disposition: A | Discharge status: Home / Self Care | Payer: Self-pay | Source: Ambulatory Visit | Attending: Gerontology | Admitting: Gerontology

## 2021-03-15 DIAGNOSIS — R059 Cough, unspecified: Secondary | ICD-10-CM

## 2021-03-15 DIAGNOSIS — Z79899 Other long term (current) drug therapy: Secondary | ICD-10-CM | POA: Insufficient documentation

## 2021-03-15 DIAGNOSIS — Z125 Encounter for screening for malignant neoplasm of prostate: Secondary | ICD-10-CM | POA: Insufficient documentation

## 2021-03-15 DIAGNOSIS — I10 Essential (primary) hypertension: Secondary | ICD-10-CM | POA: Insufficient documentation

## 2021-03-15 LAB — CBC WITH DIFFERENTIAL/PLATELET
Abs Immature Granulocytes: 0.02 10*3/uL (ref 0.00–0.07)
Basophils Absolute: 0.1 10*3/uL (ref 0.0–0.1)
Basophils Relative: 1 %
Eosinophils Absolute: 0 10*3/uL (ref 0.0–0.5)
Eosinophils Relative: 0 %
HCT: 42 % (ref 39.0–52.0)
Hemoglobin: 14.4 g/dL (ref 13.0–17.0)
Immature Granulocytes: 0 %
Lymphocytes Relative: 24 %
Lymphs Abs: 2.1 10*3/uL (ref 0.7–4.0)
MCH: 32.7 pg (ref 26.0–34.0)
MCHC: 34.3 g/dL (ref 30.0–36.0)
MCV: 95.5 fL (ref 80.0–100.0)
Monocytes Absolute: 0.5 10*3/uL (ref 0.1–1.0)
Monocytes Relative: 6 %
Neutro Abs: 5.8 10*3/uL (ref 1.7–7.7)
Neutrophils Relative %: 69 %
Platelets: 254 10*3/uL (ref 150–400)
RBC: 4.4 MIL/uL (ref 4.22–5.81)
RDW: 13.4 % (ref 11.5–15.5)
WBC: 8.5 10*3/uL (ref 4.0–10.5)
nRBC: 0 % (ref 0.0–0.2)

## 2021-03-15 LAB — BASIC METABOLIC PANEL
Anion gap: 8 (ref 5–15)
BUN: 7 mg/dL (ref 6–20)
CO2: 27 mmol/L (ref 22–32)
Calcium: 9.2 mg/dL (ref 8.9–10.3)
Chloride: 102 mmol/L (ref 98–111)
Creatinine, Ser: 0.95 mg/dL (ref 0.61–1.24)
GFR, Estimated: >60 mL/min (ref >60)
Glucose, Bld: 114 mg/dL — ABNORMAL HIGH (ref 70–99)
Potassium: 3.6 mmol/L (ref 3.5–5.1)
Sodium: 137 mmol/L (ref 135–145)

## 2021-03-15 LAB — LIPID PANEL
Cholesterol: 202 mg/dL — ABNORMAL HIGH (ref 0–200)
HDL: 47 mg/dL (ref >40)
LDL Cholesterol: 120 mg/dL — ABNORMAL HIGH (ref 0–99)
Total CHOL/HDL Ratio: 4.3 RATIO
Triglycerides: 173 mg/dL — ABNORMAL HIGH (ref <150)
VLDL: 35 mg/dL (ref 0–40)

## 2021-03-15 LAB — HEPATIC FUNCTION PANEL
ALT: 36 U/L (ref 0–44)
AST: 41 U/L (ref 15–41)
Albumin: 4 g/dL (ref 3.5–5.0)
Alkaline Phosphatase: 72 U/L (ref 38–126)
Bilirubin, Direct: 0.1 mg/dL (ref 0.0–0.2)
Indirect Bilirubin: 0.5 mg/dL (ref 0.3–0.9)
Total Bilirubin: 0.6 mg/dL (ref 0.3–1.2)
Total Protein: 7.6 g/dL (ref 6.5–8.1)

## 2021-03-15 LAB — URIC ACID: Uric Acid, Serum: 6.1 mg/dL (ref 3.7–8.6)

## 2021-03-15 LAB — PSA: Prostatic Specific Antigen: 0.78 ng/mL (ref 0.00–4.00)

## 2021-10-16 ENCOUNTER — Other Ambulatory Visit: Payer: Self-pay

## 2021-10-16 ENCOUNTER — Emergency Department (HOSPITAL_COMMUNITY)
Admission: EM | Admit: 2021-10-16 | Discharge: 2021-10-16 | Disposition: A | Discharge status: Home / Self Care | Payer: Self-pay | Attending: Emergency Medicine | Admitting: Emergency Medicine

## 2021-10-16 ENCOUNTER — Encounter (HOSPITAL_COMMUNITY): Payer: Self-pay

## 2021-10-16 DIAGNOSIS — R200 Anesthesia of skin: Secondary | ICD-10-CM | POA: Insufficient documentation

## 2021-10-16 DIAGNOSIS — F17210 Nicotine dependence, cigarettes, uncomplicated: Secondary | ICD-10-CM | POA: Insufficient documentation

## 2021-10-16 DIAGNOSIS — I10 Essential (primary) hypertension: Secondary | ICD-10-CM | POA: Insufficient documentation

## 2021-10-16 NOTE — ED Provider Notes (Signed)
[facility]-EMERGENCY DEPT [facility] Emergency Department Provider Note MRN:  [id number]  Arrival date & time: 10/16/21     Chief Complaint   leg numbness (Right X 6 months)   History of Present Illness   [name] is a 61 y.o. year-old male with a history of stroke presenting to the ED with chief complaint of leg numbness.  Decreased sensation to the right leg for at least 6 months.  No significant changes this evening.  Just becoming frustrated with the symptoms.  No weakness to the arms or legs, no trouble with speech, no vision changes, no swallowing issues, no other complaints.  Drinking and using cocaine this evening.  Review of Systems  A thorough review of systems was obtained and all systems are negative except as noted in the HPI and PMH.   Patient's Health History    Past Medical History:  Diagnosis Date   Anginal pain (HCC)    Hepatitis B 1991   History of alcohol abuse    History of cocaine abuse (HCC)    Hypercholesterolemia    Hypertension    Mood disorder (HCC)    NOS. Admitted to [facility] for detox in 12/2005   Stroke [facility]) 08/05/2012   denies residual (08/05/2012)   Tobacco abuse     Past Surgical History:  Procedure Laterality Date   MOUTH SURGERY  2011   "had to cut tooth out of my gum" (08/05/2012)   TEE WITHOUT CARDIOVERSION  08/07/2012   Procedure: TRANSESOPHAGEAL ECHOCARDIOGRAM (TEE);  Surgeon: Vesta Mixer, MD;  Location: [facility] ENDOSCOPY;  Service: Cardiovascular;  Laterality: N/A;    Family History  Problem Relation Age of Onset   Hypertension Mother    Osteoarthritis Mother    Hyperlipidemia Mother    Prostate cancer Father    CAD Maternal Grandmother    Lung cancer Paternal Grandfather        tobacco abuse    Social History   Socioeconomic History   Marital status: Married    Spouse name: Not on file   Number of children: 1   Years of education: 1y college   Highest education level: Not on file  Occupational History   Occupation:  [employer]    Employer: UNEMPLOYED  Tobacco Use   Smoking status: Every Day    Packs/day: 1.50    Years: 35.00    Pack years: 52.50    Types: Cigarettes   Smokeless tobacco: Current    Types: Snuff  Vaping Use   Vaping Use: Never used  Substance and Sexual Activity   Alcohol use: Yes    Alcohol/week: 2.0 standard drinks    Types: 2 Cans of beer per week    Comment: 1-2 beers a week / previously drank heavily until 2009 6 pack or more of beer daily.   Drug use: Yes    Frequency: 7.0 times per week    Types: Marijuana, Heroin    Comment: stopped in [facility], smoked cocaine, some heroi0n (quit in 2011); last UDS 03/2012 + for cocaine/resident note 08/05/2012   Sexual activity: Yes    Comment: patient reports no longer uses cocaine.   Other Topics Concern   Not on file  Social History Narrative   Lives in [place], [place] alone.   Social Determinants of Health   Financial Resource Strain: Not on file  Food Insecurity: Not on file  Transportation Needs: Not on file  Physical Activity: Not on file  Stress: Not on file  Social Connections:  Not on file  Intimate Partner Violence: Not on file     Physical Exam   Vitals:   10/16/21 0048 10/16/21 0100  BP: (!) 145/94 (!) 168/93  Pulse: 79 80  Resp: 16 18  Temp: 98.6 F (37 C)   SpO2: 96% 98%    CONSTITUTIONAL: Well-appearing, NAD NEURO/PSYCH:  Alert and oriented x 3, normal and symmetric strength to the arms and legs, no facial droop.  Subjective decreased sensation to the right leg EYES:  eyes equal and reactive ENT/NECK:  no LAD, no JVD CARDIO: Regular rate, well-perfused, normal S1 and S2 PULM:  CTAB no wheezing or rhonchi GI/GU:  non-distended, non-tender MSK/SPINE:  No gross deformities, no edema SKIN:  no rash, atraumatic   *Additional and/or pertinent findings included in MDM below  Diagnostic and Interventional Summary    EKG Interpretation  Date/Time:    Ventricular Rate:    PR Interval:    QRS  Duration:   QT Interval:    QTC Calculation:   R Axis:     Text Interpretation:         Labs Reviewed - No data to display  No orders to display    Medications - No data to display   Procedures  /  Critical Care Procedures  ED Course and Medical Decision Making  Initial Impression and Ddx Chronic sensory deficit to the right lower extremity, no acute changes.  Vital signs normal, nothing to suggest acute stroke.  This would be more of a completed chronic stroke.  Other considerations include some type of radiculopathy of the lumbar spine, overall doubt CNS lesion causing an isolated mild sensory deficit.  Little benefit in diagnostics this evening, will refer to neurology.  Past medical/surgical history that increases complexity of ED encounter: History of stroke  Interpretation of Diagnostics Not applicable  Patient Reassessment and Ultimate Disposition/Management Discharge home  Patient management required discussion with the following services or consulting groups:    Complexity of Problems Addressed Acute illness or injury that poses threat of life of bodily function  Additional Data Reviewed and Analyzed Further history obtained from: Recent Consult notes   Elmer Sow. Pilar Plate, MD [facility] Emergency Medicine [facility] [email or web address]  Final Clinical Impressions(s) / ED Diagnoses     ICD-10-CM   1. Leg numbness  R20.0       ED Discharge Orders          Ordered    Ambulatory referral to Neurology       Comments: An appointment is requested in approximately: 2 weeks   10/16/21 0151             Discharge Instructions Discussed with and Provided to Patient:    Discharge Instructions      You were evaluated in the Emergency Department and after careful evaluation, we did not find any emergent condition requiring admission or further testing in the hospital.  Your numbness may be due to a stroke but occurred several months ago.   It is important that you follow-up with a neurologist for further care and management.  Please return to the Emergency Department if you experience any worsening of your condition.   Thank you for allowing [place] to be a part of your care.        Sabas Sous, MD 10/16/21 [phone]

## 2021-10-16 NOTE — Discharge Instructions (Signed)
You were evaluated in the Emergency Department and after careful evaluation, we did not find any emergent condition requiring admission or further testing in the hospital.  Your numbness may be due to a stroke but occurred several months ago.  It is important that you follow-up with a neurologist for further care and management.  Please return to the Emergency Department if you experience any worsening of your condition.   Thank you for allowing [place] to be a part of your care.

## 2021-10-16 NOTE — ED Notes (Signed)
During Triage, Pt presents with delayed, slurred speech. Pt endorses drinking large amount of ETOH PTA with cocaine use. Pt resting comfortably at this time.

## 2021-10-16 NOTE — ED Triage Notes (Signed)
Pt arrived via pov from home c/o on-going right leg numbness X 6 months. Pt reports he was working a lot with constant has Hx of sciatic pain. Pt denies injury. Pt unable to recollect name of neurologist he has seen in the past. Pt also reports difficulty micturating for same duration of time. Pt reports sensation of feeling the urge to micturate with inability to empty bladder completely. Pt reports family Hx of prostate problems.

## 2021-10-16 NOTE — ED Notes (Signed)
ED Provider at bedside. 

## 2021-12-26 ENCOUNTER — Ambulatory Visit: Payer: Self-pay | Admitting: Neurology

## 2021-12-26 ENCOUNTER — Encounter: Payer: Self-pay | Admitting: Neurology

## 2022-04-03 ENCOUNTER — Emergency Department (HOSPITAL_COMMUNITY)
Admission: EM | Admit: 2022-04-03 | Discharge: 2022-04-04 | Disposition: A | Discharge status: Home / Self Care | Payer: 59 | Attending: Emergency Medicine | Admitting: Emergency Medicine

## 2022-04-03 ENCOUNTER — Encounter (HOSPITAL_COMMUNITY): Payer: Self-pay | Admitting: Emergency Medicine

## 2022-04-03 DIAGNOSIS — I10 Essential (primary) hypertension: Secondary | ICD-10-CM | POA: Diagnosis not present

## 2022-04-03 DIAGNOSIS — Z79899 Other long term (current) drug therapy: Secondary | ICD-10-CM | POA: Diagnosis not present

## 2022-04-03 DIAGNOSIS — R69 Illness, unspecified: Secondary | ICD-10-CM | POA: Diagnosis not present

## 2022-04-03 DIAGNOSIS — Z7982 Long term (current) use of aspirin: Secondary | ICD-10-CM | POA: Insufficient documentation

## 2022-04-03 DIAGNOSIS — F32A Depression, unspecified: Secondary | ICD-10-CM | POA: Insufficient documentation

## 2022-04-03 DIAGNOSIS — R9082 White matter disease, unspecified: Secondary | ICD-10-CM | POA: Diagnosis not present

## 2022-04-03 DIAGNOSIS — R29818 Other symptoms and signs involving the nervous system: Secondary | ICD-10-CM | POA: Diagnosis not present

## 2022-04-03 DIAGNOSIS — G9389 Other specified disorders of brain: Secondary | ICD-10-CM | POA: Diagnosis not present

## 2022-04-03 DIAGNOSIS — R202 Paresthesia of skin: Secondary | ICD-10-CM | POA: Diagnosis not present

## 2022-04-03 DIAGNOSIS — R200 Anesthesia of skin: Secondary | ICD-10-CM | POA: Diagnosis not present

## 2022-04-03 NOTE — ED Triage Notes (Signed)
Pt arrives c/o facial numbness since this morning. Pt states the numbness is on both sides of his face but worse on the left side.   Pt states he is prescribed blood thinners but hasn't taken them for the past year.   Pt states he has been drinking beer tonight. Pt also admits to smoking crack tonight. Pt also states he is now homeless.

## 2022-04-04 ENCOUNTER — Encounter (HOSPITAL_COMMUNITY): Payer: Self-pay

## 2022-04-04 ENCOUNTER — Emergency Department (HOSPITAL_COMMUNITY): Payer: 59

## 2022-04-04 ENCOUNTER — Emergency Department (HOSPITAL_COMMUNITY)
Admission: EM | Admit: 2022-04-04 | Discharge: 2022-04-04 | Disposition: A | Discharge status: Home / Self Care | Payer: 59 | Source: Home / Self Care | Attending: Emergency Medicine | Admitting: Emergency Medicine

## 2022-04-04 ENCOUNTER — Encounter (HOSPITAL_COMMUNITY): Payer: Self-pay | Admitting: Emergency Medicine

## 2022-04-04 ENCOUNTER — Other Ambulatory Visit: Payer: Self-pay

## 2022-04-04 DIAGNOSIS — R9082 White matter disease, unspecified: Secondary | ICD-10-CM | POA: Diagnosis not present

## 2022-04-04 DIAGNOSIS — I10 Essential (primary) hypertension: Secondary | ICD-10-CM | POA: Insufficient documentation

## 2022-04-04 DIAGNOSIS — G9389 Other specified disorders of brain: Secondary | ICD-10-CM | POA: Diagnosis not present

## 2022-04-04 DIAGNOSIS — R69 Illness, unspecified: Secondary | ICD-10-CM | POA: Diagnosis not present

## 2022-04-04 DIAGNOSIS — R29818 Other symptoms and signs involving the nervous system: Secondary | ICD-10-CM | POA: Diagnosis not present

## 2022-04-04 DIAGNOSIS — F32A Depression, unspecified: Secondary | ICD-10-CM | POA: Insufficient documentation

## 2022-04-04 DIAGNOSIS — R200 Anesthesia of skin: Secondary | ICD-10-CM | POA: Diagnosis not present

## 2022-04-04 DIAGNOSIS — Z79899 Other long term (current) drug therapy: Secondary | ICD-10-CM | POA: Insufficient documentation

## 2022-04-04 MED ORDER — TAMSULOSIN HCL 0.4 MG PO CAPS: 0.4000 mg | ORAL_CAPSULE | Freq: Every day | ORAL | 1 refills | Status: AC

## 2022-04-04 MED ORDER — LABETALOL HCL 200 MG PO TABS: 200.0000 mg | ORAL_TABLET | Freq: Two times a day (BID) | ORAL | 2 refills | Status: AC

## 2022-04-04 MED ORDER — LABETALOL HCL 200 MG PO TABS
200.0000 mg | ORAL_TABLET | Freq: Once | ORAL | Status: AC
  Administered 2022-04-04: 200 mg via ORAL
  Filled 2022-04-04: qty 1

## 2022-04-04 NOTE — ED Notes (Signed)
Pt admits to using crack cocaine earlier today and right sided tingling is normal for pt since previous stroke. Pt states is not taking any medication at this time. Denies taking a blood thinner

## 2022-04-04 NOTE — ED Provider Notes (Signed)
[facility] Provider Note   CSN: [id number] Arrival date & time: 04/03/22  2234     History  Chief Complaint  Patient presents with   Facial Numbness    [name] is a 61 y.o. male.  HPI     This is a 61 year old male who presents with facial numbness.  Patient reports numbness on both sides of the face.  He states his whole face feels weird.  He was drinking alcohol at onset of symptoms.  He does have a history of prior stroke with some residual right-sided paresthesias.  He reports both alcohol and crack cocaine use earlier today.  He does not any medications.  He is homeless.  Patient denies speech difficulty, facial droop, weakness, difficulty ambulating.  Home Medications Prior to Admission medications   Medication Sig Start Date End Date Taking? Authorizing Provider  albuterol (VENTOLIN HFA) 108 (90 Base) MCG/ACT inhaler Inhale 1-2 puffs into the lungs every 6 (six) hours as needed for wheezing or shortness of breath. 09/15/20   Couture, Cortni S, PA-C  amLODipine (NORVASC) 10 MG tablet Take 1 tablet (10 mg total) by mouth daily. 01/25/19   Debbe Odea, MD  aspirin 325 MG tablet Take 325 mg by mouth daily.    [provider]  bisoprolol-hydrochlorothiazide (ZIAC) 5-6.25 MG tablet Take 1 tablet by mouth daily. Patient not taking: Reported on 02/10/2021 01/24/19   Debbe Odea, MD  cetirizine (ZYRTEC) 10 MG chewable tablet Chew 1 tablet (10 mg total) by mouth daily. Patient not taking: Reported on 02/10/2021 02/24/19   Wurst, [place], PA-C  fluticasone [facility]) 50 MCG/ACT nasal spray Place 2 sprays into both nostrils daily. Patient not taking: Reported on 02/10/2021 02/24/19   Wurst, [place], PA-C  guaiFENesin (MUCINEX) 600 MG 12 hr tablet Take 1 tablet (600 mg total) by mouth 2 (two) times daily. Patient not taking: Reported on 02/10/2021 09/15/20   Couture, Cortni S, PA-C  ondansetron (ZOFRAN) 4 MG tablet Take 1 tablet (4 mg total) by mouth every 6  (six) hours. Patient not taking: Reported on 02/10/2021 04/27/20   Wurst, [place], PA-C  pantoprazole (PROTONIX) 40 MG tablet Take 1 tablet (40 mg total) by mouth 2 (two) times daily before a meal. Patient not taking: Reported on 02/10/2021 01/24/19   Debbe Odea, MD  rosuvastatin (CRESTOR) 10 MG tablet Take 1 tablet (10 mg total) by mouth daily. Patient not taking: Reported on 02/10/2021 01/25/19   Debbe Odea, MD      Allergies    Lisinopril and Doxycycline    Review of Systems   Review of Systems  Constitutional:  Negative for fever.  Neurological:  Positive for numbness. Negative for dizziness, facial asymmetry, weakness and headaches.  All other systems reviewed and are negative.   Physical Exam Updated Vital Signs BP (!) 174/101   Pulse 65   Temp 98.3 F (36.8 C) (Oral)   Resp 20   Ht 1.702 m (5\' 7" )   Wt 83.9 kg   SpO2 96%   BMI 28.97 kg/m  Physical Exam Vitals and nursing note reviewed.  Constitutional:      Appearance: He is well-developed. He is not ill-appearing.     Comments: Chronically ill-appearing but  HENT:     Head: Normocephalic and atraumatic.     Nose: Nose normal.  Eyes:     Pupils: Pupils are equal, round, and reactive to light.  Cardiovascular:     Rate and Rhythm: Normal rate and regular rhythm.  Heart sounds: Normal heart sounds. No murmur heard. Pulmonary:     Effort: Pulmonary effort is normal. No respiratory distress.     Breath sounds: Normal breath sounds. No wheezing.  Abdominal:     Palpations: Abdomen is soft.     Tenderness: There is no abdominal tenderness.  Musculoskeletal:     Cervical back: Neck supple.  Lymphadenopathy:     Cervical: No cervical adenopathy.  Skin:    General: Skin is warm and dry.  Neurological:     Mental Status: He is alert and oriented to person, place, and time.     Comments: Cranial nerves II through XII intact, 5 out of 5 strength in all 4 extremities, no dysmetria to finger-nose-finger, sensation  grossly intact     ED Results / Procedures / Treatments   Labs (all labs ordered are listed, but only abnormal results are displayed) Labs Reviewed - No data to display  EKG None  Radiology CT Head Wo Contrast  Result Date: 04/04/2022 CLINICAL DATA:  Acute neurological deficit. Stroke suspected. Facial numbness since this morning. Worse on the left side. EXAM: CT HEAD WITHOUT CONTRAST TECHNIQUE: Contiguous axial images were obtained from the base of the skull through the vertex without intravenous contrast. RADIATION DOSE REDUCTION: This exam was performed according to the departmental dose-optimization program which includes automated exposure control, adjustment of the mA and/or kV according to patient size and/or use of iterative reconstruction technique. COMPARISON:  01/23/2019 FINDINGS: Brain: Patchy low-attenuation changes in the deep white matter likely representing small vessel ischemic change although other demyelinating or dysmyelinating processes could also have this appearance. No change in pattern or distribution since previous study. Old area of encephalomalacia in the left inferior cerebellum suggesting old infarct and unchanged. No mass-effect or midline shift. No abnormal extra-axial fluid collections. Gray-white matter junctions are mostly distinct. Basal cisterns are not effaced. No acute intracranial hemorrhage. Vascular: No hyperdense vessel or unexpected calcification. Skull: Calvarium appears intact. Sinuses/Orbits: Paranasal sinuses and mastoid air cells are clear. Other: None. IMPRESSION: 1. No acute intracranial abnormalities. 2. Diffuse white matter disease, likely small vessel ischemia. 3. 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 Electronically Signed   By: Burman Nieves M.D.   On: 04/04/2022 01:13    Procedures Procedures    Medications Ordered in ED Medications - No data to display  ED Course/ Medical Decision Making/ A&P                           Medical  Decision Making Amount and/or Complexity of Data Reviewed Radiology: ordered.   This patient presents to the ED for concern of facial numbness, this involves an extensive number of treatment options, and is a complaint that carries with it a high risk of complications and morbidity.  I considered the following differential and admission for this acute, potentially life threatening condition.  The differential diagnosis includes paresthesias, vasospasm, less likely stroke given bilateral nature  MDM:    This is a 61 year old male who presents with numbness of the face.  He is nontoxic and vital signs are reassuring with the exception of a blood pressure of 174/101.  Reports alcohol and cocaine use today.  His complaint is bilateral.  Low suspicion for CVA although he does have a history of the same.  His exam is largely reassuring.  CT scan shows no evidence of acute bleed.  It does show old left cerebellar stroke.  Suspect his symptoms may be  related to acute drug and alcohol abuse.  We discussed the dangers of this.  Also encouraged him to take his medications as prescribed.  (Labs, imaging, consults)  Labs: I Ordered, and personally interpreted labs.  The pertinent results include: None  Imaging Studies ordered: I ordered imaging studies including CT head I independently visualized and interpreted imaging. I agree with the radiologist interpretation  Additional history obtained from chart review.  External records from outside source obtained and reviewed including stroke work-up  Cardiac Monitoring: The patient was maintained on a cardiac monitor.  I personally viewed and interpreted the cardiac monitored which showed an underlying rhythm of: Normal sinus rhythm  Reevaluation: After the interventions noted above, I reevaluated the patient and found that they have :stayed the same  Social Determinants of Health: Homeless, alcohol and drug abuse  Disposition: Discharge  Co  morbidities that complicate the patient evaluation  Past Medical History:  Diagnosis Date   Anginal pain (HCC)    Hepatitis B 1991   History of alcohol abuse    History of cocaine abuse (HCC)    Hypercholesterolemia    Hypertension    Mood disorder (HCC)    NOS. Admitted to [facility] for detox in 12/2005   Stroke [facility]) 08/05/2012   denies residual (08/05/2012)   Tobacco abuse      Medicines No orders of the defined types were placed in this encounter.   I have reviewed the patients home medicines and have made adjustments as needed  Problem List / ED Course: Problem List Items Addressed This Visit   None Visit Diagnoses     Paresthesias    -  Primary                   Final Clinical Impression(s) / ED Diagnoses Final diagnoses:  Paresthesias    Rx / DC Orders ED Discharge Orders     None         Shon Baton, MD 04/04/22 0140

## 2022-04-04 NOTE — ED Provider Notes (Signed)
[name] EMERGENCY DEPARTMENT Provider Note   CSN: [id number] Arrival date & time: 04/04/22  0865     History {Add pertinent medical, surgical, social history, OB history to HPI:1} Chief Complaint  Patient presents with   Depression    [name] is a 61 y.o. male.  Patient was brought to the emergency department because of depression.  Patient has a history of hypertension and urinary urgency   Depression       Home Medications Prior to Admission medications   Medication Sig Start Date End Date Taking? Authorizing Provider  labetalol (NORMODYNE) 200 MG tablet Take 1 tablet (200 mg total) by mouth 2 (two) times daily. 04/04/22  Yes Bethann Berkshire, MD      Allergies    Lisinopril and Doxycycline    Review of Systems   Review of Systems  Psychiatric/Behavioral:  Positive for depression.     Physical Exam Updated Vital Signs BP (!) 171/105 (BP Location: Right Arm)   Pulse 70   Temp 97.8 F (36.6 C)   Resp 18   Ht 5\' 7"  (1.702 m)   Wt 83.9 kg   SpO2 95%   BMI 28.98 kg/m  Physical Exam  ED Results / Procedures / Treatments   Labs (all labs ordered are listed, but only abnormal results are displayed) Labs Reviewed - No data to display  EKG None  Radiology CT Head Wo Contrast  Result Date: 04/04/2022 CLINICAL DATA:  Acute neurological deficit. Stroke suspected. Facial numbness since this morning. Worse on the left side. EXAM: CT HEAD WITHOUT CONTRAST TECHNIQUE: Contiguous axial images were obtained from the base of the skull through the vertex without intravenous contrast. RADIATION DOSE REDUCTION: This exam was performed according to the departmental dose-optimization program which includes automated exposure control, adjustment of the mA and/or kV according to patient size and/or use of iterative reconstruction technique. COMPARISON:  01/23/2019 FINDINGS: Brain: Patchy low-attenuation changes in the deep white matter likely representing small vessel  ischemic change although other demyelinating or dysmyelinating processes could also have this appearance. No change in pattern or distribution since previous study. Old area of encephalomalacia in the left inferior cerebellum suggesting old infarct and unchanged. No mass-effect or midline shift. No abnormal extra-axial fluid collections. Gray-white matter junctions are mostly distinct. Basal cisterns are not effaced. No acute intracranial hemorrhage. Vascular: No hyperdense vessel or unexpected calcification. Skull: Calvarium appears intact. Sinuses/Orbits: Paranasal sinuses and mastoid air cells are clear. Other: None. IMPRESSION: 1. No acute intracranial abnormalities. 2. Diffuse white matter disease, likely small vessel ischemia. 3. 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 Electronically Signed   By: 01/25/2019 M.D.   On: 04/04/2022 01:13    Procedures Procedures  {Document cardiac monitor, telemetry assessment procedure when appropriate:1}  Medications Ordered in ED Medications  labetalol (NORMODYNE) tablet 200 mg (200 mg Oral Given 04/04/22 04/06/22)    ED Course/ Medical Decision Making/ A&P                           Medical Decision Making Risk Prescription drug management.   Patient depressed but not suicidal.  He is referred to [facility].  Patient also with hypertension but not on any medicines.  He was started on labetalol and will follow-up with a family doctor  {Document critical care time when appropriate:1} {Document review of labs and clinical decision tools ie heart score, Chads2Vasc2 etc:1}  {Document your independent review of radiology images, and any outside  records:1} {Document your discussion with family members, caretakers, and with consultants:1} {Document social determinants of health affecting pt's care:1} {Document your decision making why or why not admission, treatments were needed:1} Final Clinical Impression(s) / ED Diagnoses Final diagnoses:  Primary  hypertension  Acute depression    Rx / DC Orders ED Discharge Orders          Ordered    labetalol (NORMODYNE) 200 MG tablet  2 times daily        04/04/22 1028

## 2022-04-04 NOTE — Discharge Instructions (Signed)
You were seen today for numbness.  This was while smoking and drinking.  Your CT scan does not show any new stroke.  You need to make sure that you are taking your medications as prescribed.  Additionally you should avoid alcohol and illicit drug use.

## 2022-04-04 NOTE — Discharge Instructions (Signed)
Follow-up at [facility] for your depression.  He did not call make an appointment or you can just show up.  Have your blood pressure rechecked in a couple weeks

## 2022-04-04 NOTE — ED Triage Notes (Signed)
PD brought pt in voluntarily.  Patient called out from an abandoned house c/o depression.  Patient reports yesterday he was having thoughts of hurting himself Plan-run out in front of a truck Patient reports being homeless

## 2022-04-04 NOTE — ED Notes (Signed)
ED Provider at bedside. 

## 2022-04-11 DIAGNOSIS — G629 Polyneuropathy, unspecified: Secondary | ICD-10-CM | POA: Diagnosis not present

## 2022-04-11 DIAGNOSIS — Z8673 Personal history of transient ischemic attack (TIA), and cerebral infarction without residual deficits: Secondary | ICD-10-CM | POA: Diagnosis not present

## 2022-04-11 DIAGNOSIS — E663 Overweight: Secondary | ICD-10-CM | POA: Diagnosis not present

## 2022-04-11 DIAGNOSIS — Z139 Encounter for screening, unspecified: Secondary | ICD-10-CM | POA: Diagnosis not present

## 2022-04-11 DIAGNOSIS — N400 Enlarged prostate without lower urinary tract symptoms: Secondary | ICD-10-CM | POA: Diagnosis not present

## 2022-04-11 DIAGNOSIS — Z125 Encounter for screening for malignant neoplasm of prostate: Secondary | ICD-10-CM | POA: Diagnosis not present

## 2022-04-11 DIAGNOSIS — Z6828 Body mass index (BMI) 28.0-28.9, adult: Secondary | ICD-10-CM | POA: Diagnosis not present

## 2022-04-11 DIAGNOSIS — I10 Essential (primary) hypertension: Secondary | ICD-10-CM | POA: Diagnosis not present

## 2022-04-26 DIAGNOSIS — Z139 Encounter for screening, unspecified: Secondary | ICD-10-CM | POA: Diagnosis not present

## 2022-04-26 DIAGNOSIS — I10 Essential (primary) hypertension: Secondary | ICD-10-CM | POA: Diagnosis not present

## 2022-04-26 DIAGNOSIS — Z125 Encounter for screening for malignant neoplasm of prostate: Secondary | ICD-10-CM | POA: Diagnosis not present

## 2022-04-28 ENCOUNTER — Emergency Department (HOSPITAL_COMMUNITY)
Admission: EM | Admit: 2022-04-28 | Discharge: 2022-04-28 | Disposition: A | Discharge status: Home / Self Care | Payer: 59 | Attending: Emergency Medicine | Admitting: Emergency Medicine

## 2022-04-28 ENCOUNTER — Other Ambulatory Visit: Payer: Self-pay

## 2022-04-28 ENCOUNTER — Encounter (HOSPITAL_COMMUNITY): Payer: Self-pay | Admitting: Emergency Medicine

## 2022-04-28 DIAGNOSIS — I160 Hypertensive urgency: Secondary | ICD-10-CM

## 2022-04-28 DIAGNOSIS — I10 Essential (primary) hypertension: Secondary | ICD-10-CM | POA: Diagnosis not present

## 2022-04-28 DIAGNOSIS — Z79899 Other long term (current) drug therapy: Secondary | ICD-10-CM | POA: Insufficient documentation

## 2022-04-28 DIAGNOSIS — R519 Headache, unspecified: Secondary | ICD-10-CM | POA: Diagnosis not present

## 2022-04-28 MED ORDER — AMLODIPINE BESYLATE 5 MG PO TABS
10.0000 mg | ORAL_TABLET | Freq: Once | ORAL | Status: AC
  Administered 2022-04-28: 10 mg via ORAL
  Filled 2022-04-28: qty 2

## 2022-04-28 MED ORDER — AMLODIPINE BESYLATE 10 MG PO TABS: 10.0000 mg | ORAL_TABLET | Freq: Every day | ORAL | 0 refills | Status: DC

## 2022-04-28 MED ORDER — AMLODIPINE BESYLATE 10 MG PO TABS: 10.0000 mg | ORAL_TABLET | Freq: Every day | ORAL | 1 refills | Status: AC

## 2022-04-28 NOTE — ED Notes (Signed)
Pt d/c home per MD order. Discharge summary reviewed, pt verbalizes understanding. Ambulatory. No s/s of acute distress noted at discharge.  

## 2022-04-28 NOTE — Discharge Instructions (Addendum)
Please keep a blood pressure log and monitor your blood pressure twice per day if you can or at least once per day.  Just keep a 1 sentence symptom check below that number she can follow-up with Dr. Margo Aye and have your numbers.  I have refilled your amlodipine.  Please take your first dose tonight.  Please follow-up with Dr. Margo Aye on Tuesday.  Return to the emergency department for any worsening symptoms.

## 2022-04-28 NOTE — ED Triage Notes (Signed)
Pt presents with hypertension, took he' last bp pill prescribed by [facility] this am, needs primary and bp meds.

## 2022-04-28 NOTE — ED Provider Notes (Signed)
[facility] EMERGENCY DEPARTMENT Provider Note   CSN: [id number] Arrival date & time: 04/28/22  1146     History Chief Complaint  Patient presents with   Hypertension    [name] is a 61 y.o. male with history of hypertension who presents to the emergency department for further evaluation of elevated blood pressures.  Patient had a physical with his job today and was sent here for elevated blood pressures.  He initially had a headache earlier today but that is since resolved.  He states that he has been on amlodipine for years and that has done a great job controlling his blood pressure.  He ran out of that medication and was seen here in the emergency room 3 weeks ago and placed on labetalol.  He states that labetalol is not working his blood pressures have still been elevated.  He took his last labetalol today.  Patient has no numbness, weakness, chest pain, shortness of breath, headache, blurred vision, hematuria, abdominal pain, nausea, vomiting, diarrhea.  He recently establish care with Dr. Margo Aye and will see him on Tuesday.   Hypertension       Home Medications Prior to Admission medications   Medication Sig Start Date End Date Taking? Authorizing Provider  amLODipine (NORVASC) 10 MG tablet Take 1 tablet (10 mg total) by mouth daily. 04/28/22   Teressa Lower, PA-C  labetalol (NORMODYNE) 200 MG tablet Take 1 tablet (200 mg total) by mouth 2 (two) times daily. 04/04/22   Bethann Berkshire, MD  tamsulosin (FLOMAX) 0.4 MG CAPS capsule Take 1 capsule (0.4 mg total) by mouth daily. 04/04/22   Bethann Berkshire, MD      Allergies    Lisinopril and Doxycycline    Review of Systems   Review of Systems  All other systems reviewed and are negative.   Physical Exam Updated Vital Signs BP (!) 208/119 (BP Location: Left Arm)   Pulse (!) 55   Temp 98.2 F (36.8 C) (Oral)   Resp 18   Ht 5\' 7"  (1.702 m)   Wt 81.6 kg   SpO2 97%   BMI 28.19 kg/m  Physical Exam Vitals and  nursing note reviewed.  Constitutional:      General: He is not in acute distress.    Appearance: Normal appearance.  HENT:     Head: Normocephalic and atraumatic.  Eyes:     General:        Right eye: No discharge.        Left eye: No discharge.  Cardiovascular:     Comments: Regular rate and rhythm.  S1/S2 are distinct without any evidence of murmur, rubs, or gallops.  Radial pulses are 2+ bilaterally.  Dorsalis pedis pulses are 2+ bilaterally.  No evidence of pedal edema. Pulmonary:     Comments: Clear to auscultation bilaterally.  Normal effort.  No respiratory distress.  No evidence of wheezes, rales, or rhonchi heard throughout. Abdominal:     General: Abdomen is flat. Bowel sounds are normal. There is no distension.     Tenderness: There is no abdominal tenderness. There is no guarding or rebound.  Musculoskeletal:        General: Normal range of motion.     Cervical back: Neck supple.  Skin:    General: Skin is warm and dry.     Findings: No rash.  Neurological:     General: No focal deficit present.     Mental Status: He is alert and oriented to person,  place, and time.     Cranial Nerves: Cranial nerves 2-12 are intact.     Sensory: Sensation is intact.     Motor: Motor function is intact.     Coordination: Coordination is intact.  Psychiatric:        Mood and Affect: Mood normal.        Behavior: Behavior normal.     ED Results / Procedures / Treatments   Labs (all labs ordered are listed, but only abnormal results are displayed) Labs Reviewed - No data to display  EKG None  Radiology No results found.  Procedures Procedures    Medications Ordered in ED Medications  amLODipine (NORVASC) tablet 10 mg (has no administration in time range)    ED Course/ Medical Decision Making/ A&P                           Medical Decision Making [name] is a 61 y.o. male patient who presents to the emergency department today for elevated blood pressures.   Patient is asymptomatic and has no complaints.  Do not feel that labs or imaging are necessary at this time.  This is likely hypertensive urgency.  I will place the patient on amlodipine 10.  I had a long discussion with him about checking his blood pressure regularly and he will begin keeping a blood pressure log in addition to a symptom log.  He has a follow-up appointment on Tuesday with Dr. Margo Aye with primary care.  Strict return precautions were discussed.  He is safe for discharge.   Risk Prescription drug management.   Final Clinical Impression(s) / ED Diagnoses Final diagnoses:  Hypertensive urgency    Rx / DC Orders ED Discharge Orders          Ordered    amLODipine (NORVASC) 10 MG tablet  Daily,   Status:  Discontinued        04/28/22 1426    amLODipine (NORVASC) 10 MG tablet  Daily        04/28/22 [address] [place], [place] 04/28/22 1432    Derwood Kaplan, MD 04/29/22 [phone]

## 2022-05-01 ENCOUNTER — Other Ambulatory Visit: Payer: Self-pay | Admitting: Family Medicine

## 2022-05-01 ENCOUNTER — Other Ambulatory Visit (HOSPITAL_COMMUNITY): Payer: Self-pay | Admitting: Family Medicine

## 2022-05-01 DIAGNOSIS — E781 Pure hyperglyceridemia: Secondary | ICD-10-CM | POA: Diagnosis not present

## 2022-05-01 DIAGNOSIS — F17200 Nicotine dependence, unspecified, uncomplicated: Secondary | ICD-10-CM

## 2022-05-01 DIAGNOSIS — I10 Essential (primary) hypertension: Secondary | ICD-10-CM | POA: Diagnosis not present

## 2022-05-01 DIAGNOSIS — Z1211 Encounter for screening for malignant neoplasm of colon: Secondary | ICD-10-CM | POA: Diagnosis not present

## 2022-05-01 DIAGNOSIS — N400 Enlarged prostate without lower urinary tract symptoms: Secondary | ICD-10-CM | POA: Diagnosis not present

## 2022-05-01 DIAGNOSIS — Z8673 Personal history of transient ischemic attack (TIA), and cerebral infarction without residual deficits: Secondary | ICD-10-CM | POA: Diagnosis not present

## 2022-05-01 DIAGNOSIS — G629 Polyneuropathy, unspecified: Secondary | ICD-10-CM | POA: Diagnosis not present

## 2022-05-01 DIAGNOSIS — Z6827 Body mass index (BMI) 27.0-27.9, adult: Secondary | ICD-10-CM | POA: Diagnosis not present

## 2022-05-01 DIAGNOSIS — R69 Illness, unspecified: Secondary | ICD-10-CM | POA: Diagnosis not present

## 2022-05-03 ENCOUNTER — Encounter: Payer: Self-pay | Admitting: *Deleted

## 2022-05-23 ENCOUNTER — Encounter (HOSPITAL_COMMUNITY): Payer: Self-pay

## 2022-05-23 ENCOUNTER — Ambulatory Visit (HOSPITAL_COMMUNITY): Admission: RE | Admit: 2022-05-23 | Payer: 59 | Source: Ambulatory Visit

## 2022-07-13 DIAGNOSIS — J069 Acute upper respiratory infection, unspecified: Secondary | ICD-10-CM | POA: Diagnosis not present

## 2022-07-13 DIAGNOSIS — R059 Cough, unspecified: Secondary | ICD-10-CM | POA: Diagnosis not present

## 2022-07-13 DIAGNOSIS — Z20822 Contact with and (suspected) exposure to covid-19: Secondary | ICD-10-CM | POA: Diagnosis not present

## 2022-07-13 DIAGNOSIS — J00 Acute nasopharyngitis [common cold]: Secondary | ICD-10-CM | POA: Diagnosis not present

## 2022-10-16 ENCOUNTER — Encounter: Payer: Self-pay | Admitting: *Deleted

## 2022-10-30 ENCOUNTER — Other Ambulatory Visit (HOSPITAL_COMMUNITY): Payer: Self-pay | Admitting: Family Medicine

## 2022-10-30 DIAGNOSIS — G629 Polyneuropathy, unspecified: Secondary | ICD-10-CM | POA: Diagnosis not present

## 2022-10-30 DIAGNOSIS — E781 Pure hyperglyceridemia: Secondary | ICD-10-CM | POA: Diagnosis not present

## 2022-10-30 DIAGNOSIS — M25511 Pain in right shoulder: Secondary | ICD-10-CM

## 2022-10-30 DIAGNOSIS — I10 Essential (primary) hypertension: Secondary | ICD-10-CM | POA: Diagnosis not present

## 2022-10-30 DIAGNOSIS — Z0000 Encounter for general adult medical examination without abnormal findings: Secondary | ICD-10-CM | POA: Diagnosis not present

## 2022-10-30 DIAGNOSIS — N400 Enlarged prostate without lower urinary tract symptoms: Secondary | ICD-10-CM | POA: Diagnosis not present

## 2022-10-30 DIAGNOSIS — Z8673 Personal history of transient ischemic attack (TIA), and cerebral infarction without residual deficits: Secondary | ICD-10-CM | POA: Diagnosis not present

## 2022-10-30 DIAGNOSIS — F17210 Nicotine dependence, cigarettes, uncomplicated: Secondary | ICD-10-CM | POA: Diagnosis not present

## 2022-11-08 ENCOUNTER — Encounter: Payer: Self-pay | Admitting: *Deleted

## 2023-04-17 ENCOUNTER — Encounter: Payer: Self-pay | Admitting: *Deleted

## 2024-03-26 NOTE — Therapy (Signed)
 OUTPATIENT PHYSICAL THERAPY THORACOLUMBAR EVALUATION   Patient Name: [name] MRN: [id number] DOB:12/01/1960, 63 y.o., male Today's Date: 03/26/2024  END OF SESSION:   Past Medical History:  Diagnosis Date   Anginal pain (HCC)    Hepatitis B 1991   History of alcohol abuse    History of cocaine abuse (HCC)    Hypercholesterolemia    Hypertension    Mood disorder (HCC)    NOS. Admitted to [facility] for detox in 12/2005   Stroke [facility]) 08/05/2012   denies residual (08/05/2012)   Tobacco abuse    Past Surgical History:  Procedure Laterality Date   MOUTH SURGERY  2011   had to cut tooth out of my gum (08/05/2012)   TEE WITHOUT CARDIOVERSION  08/07/2012   Procedure: TRANSESOPHAGEAL ECHOCARDIOGRAM (TEE);  Surgeon: Aleene JINNY Passe, MD;  Location: [facility] ENDOSCOPY;  Service: Cardiovascular;  Laterality: N/A;   Patient Active Problem List   Diagnosis Date Noted   Chest pain 01/23/2019   Abdominal pain 01/23/2019   Hypertensive urgency 01/23/2019   H/O: CVA (cerebrovascular accident) 01/23/2019   Stroke (HCC) 08/05/2012   Hypertension 08/05/2012   Tobacco abuse 08/05/2012   History of cocaine abuse (HCC)    History of alcohol abuse     PCP: ***  REFERRING PROVIDER: ***  REFERRING DIAG: ***  Rationale for Evaluation and Treatment: {HABREHAB:27488}  THERAPY DIAG:  No diagnosis found.  ONSET DATE: ***  SUBJECTIVE:                                                                                                                                                                                           SUBJECTIVE STATEMENT: ***  PERTINENT HISTORY:  ***  PAIN:  Are you having pain? {OPRCPAIN:27236}  PRECAUTIONS: {Therapy precautions:24002}  RED FLAGS: {PT Red Flags:29287}   WEIGHT BEARING RESTRICTIONS: {Yes ***/No:24003}  FALLS:  Has patient fallen in last 6 months? {fallsyesno:27318}  LIVING ENVIRONMENT: Lives with: {OPRC lives with:25569::lives with their  family} Lives in: {Lives in:25570} Stairs: {opstairs:27293} Has following equipment at home: {Assistive devices:23999}  OCCUPATION: ***  PLOF: {PLOF:24004}  PATIENT GOALS: ***  NEXT MD VISIT: ***  OBJECTIVE:  Note: Objective measures were completed at Evaluation unless otherwise noted.  DIAGNOSTIC FINDINGS:  ***  PATIENT SURVEYS:  {rehab surveys:24030}  COGNITION: Overall cognitive status: {cognition:24006}     SENSATION: {sensation:27233}  MUSCLE LENGTH: Hamstrings: Right *** deg; Left *** deg Debby test: Right *** deg; Left *** deg  POSTURE: {posture:25561}  PALPATION: ***  LUMBAR ROM:   AROM eval  Flexion   Extension   Right lateral flexion   Left lateral flexion  Right rotation   Left rotation    (Blank rows = not tested)  LOWER EXTREMITY ROM:     {AROM/PROM:27142}  Right eval Left eval  Hip flexion    Hip extension    Hip abduction    Hip adduction    Hip internal rotation    Hip external rotation    Knee flexion    Knee extension    Ankle dorsiflexion    Ankle plantarflexion    Ankle inversion    Ankle eversion     (Blank rows = not tested)  LOWER EXTREMITY MMT:    MMT Right eval Left eval  Hip flexion    Hip extension    Hip abduction    Hip adduction    Hip internal rotation    Hip external rotation    Knee flexion    Knee extension    Ankle dorsiflexion    Ankle plantarflexion    Ankle inversion    Ankle eversion     (Blank rows = not tested)  LUMBAR SPECIAL TESTS:  {lumbar special test:25242}  FUNCTIONAL TESTS:  {Functional tests:24029}  GAIT: Distance walked: *** Assistive device utilized: {Assistive devices:23999} Level of assistance: {Levels of assistance:24026} Comments: ***  TREATMENT DATE: ***                                                                                                                                 PATIENT EDUCATION:  Education details: *** Person educated: {Person  educated:25204} Education method: {Education Method:25205} Education comprehension: {Education Comprehension:25206}  HOME EXERCISE PROGRAM: ***  ASSESSMENT:  CLINICAL IMPRESSION: Patient is a *** y.o. *** who was seen today for physical therapy evaluation and treatment for ***.   OBJECTIVE IMPAIRMENTS: {opptimpairments:25111}.   ACTIVITY LIMITATIONS: {activitylimitations:27494}  PARTICIPATION LIMITATIONS: {participationrestrictions:25113}  PERSONAL FACTORS: {Personal factors:25162} are also affecting patient's functional outcome.   REHAB POTENTIAL: {rehabpotential:25112}  CLINICAL DECISION MAKING: {clinical decision making:25114}  EVALUATION COMPLEXITY: {Evaluation complexity:25115}   GOALS: Goals reviewed with patient? {yes/no:20286}  SHORT TERM GOALS: Target date: ***  *** Baseline: Goal status: INITIAL  2.  *** Baseline:  Goal status: INITIAL  3.  *** Baseline:  Goal status: INITIAL  4.  *** Baseline:  Goal status: INITIAL  5.  *** Baseline:  Goal status: INITIAL  6.  *** Baseline:  Goal status: INITIAL  LONG TERM GOALS: Target date: ***  *** Baseline:  Goal status: INITIAL  2.  *** Baseline:  Goal status: INITIAL  3.  *** Baseline:  Goal status: INITIAL  4.  *** Baseline:  Goal status: INITIAL  5.  *** Baseline:  Goal status: INITIAL  6.  *** Baseline:  Goal status: INITIAL  PLAN:  PT FREQUENCY: {rehab frequency:25116}  PT DURATION: {rehab duration:25117}  PLANNED INTERVENTIONS: {rehab planned interventions:25118::97110-Therapeutic exercises,97530- Therapeutic [phone]- Neuromuscular re-education,97535- Self Rjmz,02859- Manual therapy}.  PLAN FOR NEXT SESSION: ***   Greig GORMAN Quivers, PT 03/26/2024, 12:49 PM

## 2024-03-27 ENCOUNTER — Ambulatory Visit (HOSPITAL_COMMUNITY): Attending: Family Medicine

## 2024-03-27 ENCOUNTER — Other Ambulatory Visit: Payer: Self-pay

## 2024-03-27 ENCOUNTER — Telehealth: Payer: Self-pay | Admitting: Physical Therapy

## 2024-03-27 DIAGNOSIS — R2689 Other abnormalities of gait and mobility: Secondary | ICD-10-CM | POA: Insufficient documentation

## 2024-03-27 DIAGNOSIS — R262 Difficulty in walking, not elsewhere classified: Secondary | ICD-10-CM | POA: Insufficient documentation

## 2024-03-27 DIAGNOSIS — M545 Low back pain, unspecified: Secondary | ICD-10-CM | POA: Diagnosis not present

## 2024-03-27 NOTE — Telephone Encounter (Signed)
 Called paitent to get him scheduled in [place] location for future visits no answer no vm box set up. Will try again

## 2024-04-01 ENCOUNTER — Telehealth: Payer: Self-pay | Admitting: Physical Therapy

## 2024-04-08 ENCOUNTER — Ambulatory Visit: Attending: Physical Therapy | Admitting: Physical Therapy

## 2024-05-16 ENCOUNTER — Emergency Department (HOSPITAL_BASED_OUTPATIENT_CLINIC_OR_DEPARTMENT_OTHER)

## 2024-05-16 ENCOUNTER — Observation Stay (HOSPITAL_BASED_OUTPATIENT_CLINIC_OR_DEPARTMENT_OTHER)
Admission: EM | Admit: 2024-05-16 | Discharge: 2024-05-20 | Disposition: A | Discharge status: Home / Self Care | Attending: Hospitalist | Admitting: Hospitalist

## 2024-05-16 ENCOUNTER — Other Ambulatory Visit: Payer: Self-pay

## 2024-05-16 DIAGNOSIS — R4182 Altered mental status, unspecified: Secondary | ICD-10-CM | POA: Diagnosis present

## 2024-05-16 DIAGNOSIS — R471 Dysarthria and anarthria: Secondary | ICD-10-CM | POA: Diagnosis present

## 2024-05-16 DIAGNOSIS — N400 Enlarged prostate without lower urinary tract symptoms: Secondary | ICD-10-CM | POA: Diagnosis not present

## 2024-05-16 DIAGNOSIS — F17210 Nicotine dependence, cigarettes, uncomplicated: Secondary | ICD-10-CM | POA: Diagnosis not present

## 2024-05-16 DIAGNOSIS — F10920 Alcohol use, unspecified with intoxication, uncomplicated: Secondary | ICD-10-CM | POA: Insufficient documentation

## 2024-05-16 DIAGNOSIS — Z79899 Other long term (current) drug therapy: Secondary | ICD-10-CM | POA: Diagnosis not present

## 2024-05-16 DIAGNOSIS — I639 Cerebral infarction, unspecified: Secondary | ICD-10-CM

## 2024-05-16 DIAGNOSIS — F14920 Cocaine use, unspecified with intoxication, uncomplicated: Secondary | ICD-10-CM | POA: Diagnosis not present

## 2024-05-16 DIAGNOSIS — F11920 Opioid use, unspecified with intoxication, uncomplicated: Secondary | ICD-10-CM | POA: Diagnosis not present

## 2024-05-16 DIAGNOSIS — I63039 Cerebral infarction due to thrombosis of unspecified carotid artery: Secondary | ICD-10-CM

## 2024-05-16 DIAGNOSIS — I10 Essential (primary) hypertension: Secondary | ICD-10-CM | POA: Insufficient documentation

## 2024-05-16 DIAGNOSIS — Z7982 Long term (current) use of aspirin: Secondary | ICD-10-CM | POA: Insufficient documentation

## 2024-05-16 DIAGNOSIS — F12920 Cannabis use, unspecified with intoxication, uncomplicated: Secondary | ICD-10-CM | POA: Diagnosis not present

## 2024-05-16 DIAGNOSIS — R4701 Aphasia: Secondary | ICD-10-CM

## 2024-05-16 LAB — DIFFERENTIAL
Abs Immature Granulocytes: 0.02 K/uL (ref 0.00–0.07)
Basophils Absolute: 0 K/uL (ref 0.0–0.1)
Basophils Relative: 0 %
Eosinophils Absolute: 0 K/uL (ref 0.0–0.5)
Eosinophils Relative: 0 %
Immature Granulocytes: 0 %
Lymphocytes Relative: 32 %
Lymphs Abs: 2.9 K/uL (ref 0.7–4.0)
Monocytes Absolute: 0.5 K/uL (ref 0.1–1.0)
Monocytes Relative: 5 %
Neutro Abs: 5.5 K/uL (ref 1.7–7.7)
Neutrophils Relative %: 63 %

## 2024-05-16 LAB — CBC
HCT: 40.7 % (ref 39.0–52.0)
Hemoglobin: 14.1 g/dL (ref 13.0–17.0)
MCH: 30.9 pg (ref 26.0–34.0)
MCHC: 34.6 g/dL (ref 30.0–36.0)
MCV: 89.3 fL (ref 80.0–100.0)
Platelets: 248 K/uL (ref 150–400)
RBC: 4.56 MIL/uL (ref 4.22–5.81)
RDW: 13.8 % (ref 11.5–15.5)
WBC: 9 K/uL (ref 4.0–10.5)
nRBC: 0 % (ref 0.0–0.2)

## 2024-05-16 LAB — COMPREHENSIVE METABOLIC PANEL WITH GFR
ALT: 11 U/L (ref 0–44)
AST: 20 U/L (ref 15–41)
Albumin: 4.5 g/dL (ref 3.5–5.0)
Alkaline Phosphatase: 91 U/L (ref 38–126)
Anion gap: 13 (ref 5–15)
BUN: 8 mg/dL (ref 8–23)
CO2: 22 mmol/L (ref 22–32)
Calcium: 10 mg/dL (ref 8.9–10.3)
Chloride: 101 mmol/L (ref 98–111)
Creatinine, Ser: 1.01 mg/dL (ref 0.61–1.24)
GFR, Estimated: >60 mL/min (ref >60)
Glucose, Bld: 97 mg/dL (ref 70–99)
Potassium: 3.8 mmol/L (ref 3.5–5.1)
Sodium: 136 mmol/L (ref 135–145)
Total Bilirubin: 0.5 mg/dL (ref 0.0–1.2)
Total Protein: 8 g/dL (ref 6.5–8.1)

## 2024-05-16 LAB — URINALYSIS, ROUTINE W REFLEX MICROSCOPIC
Bilirubin Urine: NEGATIVE
Glucose, UA: NEGATIVE mg/dL
Hgb urine dipstick: NEGATIVE
Ketones, ur: NEGATIVE mg/dL
Leukocytes,Ua: NEGATIVE
Nitrite: NEGATIVE
Specific Gravity, Urine: 1.029 (ref 1.005–1.030)
pH: 5.5 (ref 5.0–8.0)

## 2024-05-16 LAB — APTT: aPTT: 35 s (ref 24–36)

## 2024-05-16 LAB — ETHANOL: Alcohol, Ethyl (B): <15 mg/dL (ref <15)

## 2024-05-16 LAB — PROTIME-INR
INR: 1.1 (ref 0.8–1.2)
Prothrombin Time: 14.6 s (ref 11.4–15.2)

## 2024-05-16 MED ORDER — SODIUM CHLORIDE 0.9% FLUSH: 3.0000 mL | Freq: Once | INTRAVENOUS | Status: DC

## 2024-05-16 NOTE — ED Triage Notes (Signed)
 Pt POV reporting increased confusion and expressive aphasia x2 days, hx stroke. No unilateral weakness noted, endorses slight blurred vision.

## 2024-05-16 NOTE — ED Provider Notes (Signed)
 [place] EMERGENCY DEPARTMENT AT [facility] Provider Note   CSN: [id number] Arrival date & time: 05/16/24  1711     Patient presents with: Altered Mental Status   [name] is a 63 y.o. male.    Altered Mental Status Associated symptoms: no abdominal pain, no fever, no palpitations, no rash, no seizures and no vomiting    Since because of dysarthria and aphasia has been present since Monday.  According to wife, patient woke up he was having a very hard time speaking to the point time.  Has improved slightly but feels like it is there consistently.  Patient states that he just feels like he is having a very hard time explaining himself in a verbal manner.  He is also experience some slurring of words which wife agrees.  Usually, he is able to communicate really well obeys been very delayed and slowed here recently.  This all started on Monday spontaneously.  Does endorse a history of CVA.  Currently on a statin, aspirin , 2 different blood pressure medications.  Otherwise, denies any numbness or tingling wear.  No vision changes.  Does endorse episodes of vertigo but currently feeling at baseline some terms of vertigo.  Endorses difficulty walking feels like he is off balance at times.  No fever no chills.  No chest pain or shortness of breath.  Previous medical history reviewed : Patient last seen in the ED on August 2023.  Was seen because of hypertension.  Negative workup at that time     Prior to Admission medications   Medication Sig Start Date End Date Taking? Authorizing Provider  amLODipine  (NORVASC ) 10 MG tablet Take 1 tablet (10 mg total) by mouth daily. 04/28/22   Theotis Peers M, PA-C  labetalol  (NORMODYNE ) 200 MG tablet Take 1 tablet (200 mg total) by mouth 2 (two) times daily. 04/04/22   Zammit, Joseph, MD  tamsulosin  (FLOMAX ) 0.4 MG CAPS capsule Take 1 capsule (0.4 mg total) by mouth daily. 04/04/22   Zammit, Joseph, MD    Allergies: Lisinopril  and  Doxycycline    Review of Systems  Constitutional:  Negative for chills and fever.  HENT:  Negative for ear pain and sore throat.   Eyes:  Negative for pain and visual disturbance.  Respiratory:  Negative for cough and shortness of breath.   Cardiovascular:  Negative for chest pain and palpitations.  Gastrointestinal:  Negative for abdominal pain and vomiting.  Genitourinary:  Negative for dysuria and hematuria.  Musculoskeletal:  Negative for arthralgias and back pain.  Skin:  Negative for color change and rash.  Neurological:  Negative for seizures and syncope.  All other systems reviewed and are negative.   Updated Vital Signs BP 139/76 (BP Location: Right Arm)   Pulse (!) 54   Temp 98 F (36.7 C) (Oral)   Resp 18   SpO2 96%   Physical Exam Vitals and nursing note reviewed.  Constitutional:      General: He is not in acute distress.    Appearance: He is well-developed.  HENT:     Head: Normocephalic and atraumatic.  Eyes:     General: No visual field deficit.    Conjunctiva/sclera: Conjunctivae normal.  Cardiovascular:     Rate and Rhythm: Normal rate and regular rhythm.     Heart sounds: No murmur heard. Pulmonary:     Effort: Pulmonary effort is normal. No respiratory distress.     Breath sounds: Normal breath sounds.  Abdominal:  Palpations: Abdomen is soft.     Tenderness: There is no abdominal tenderness.  Musculoskeletal:        General: No swelling.     Cervical back: Neck supple.  Skin:    General: Skin is warm and dry.     Capillary Refill: Capillary refill takes less than 2 seconds.  Neurological:     Mental Status: He is alert and oriented to person, place, and time.     Cranial Nerves: Dysarthria present. No facial asymmetry.     Sensory: Sensation is intact. No sensory deficit.     Motor: Motor function is intact. No weakness or pronator drift.     Coordination: Coordination is intact. Romberg sign negative.     Gait: Gait is intact.   Psychiatric:        Mood and Affect: Mood normal.     (all labs ordered are listed, but only abnormal results are displayed) Labs Reviewed  PROTIME-INR  APTT  CBC  DIFFERENTIAL  COMPREHENSIVE METABOLIC PANEL WITH GFR  ETHANOL  URINALYSIS, ROUTINE W REFLEX MICROSCOPIC  CBG MONITORING, ED    EKG: EKG Interpretation Date/Time:  Friday May 16 2024 17:51:07 EDT Ventricular Rate:  60 PR Interval:  192 QRS Duration:  148 QT Interval:  432 QTC Calculation: 432 R Axis:   -41  Text Interpretation: Normal sinus rhythm Left axis deviation Right bundle branch block Abnormal ECG When compared with ECG of 24-Jun-2020 22:16,no acute changes Confirmed by Simon Rea [phone]) on 05/16/2024 5:56:23 PM  Radiology: CT HEAD WO CONTRAST Result Date: 05/16/2024 EXAM: CT HEAD WITHOUT CONTRAST 05/16/2024 06:45:06 PM TECHNIQUE: CT of the head was performed without the administration of intravenous contrast. Automated exposure control, iterative reconstruction, and/or weight based adjustment of the mA/kV was utilized to reduce the radiation dose to as low as reasonably achievable. COMPARISON: CT Head dated 04/04/22 was used for comparison. CLINICAL HISTORY: Neuro deficit, acute, stroke suspected. FINDINGS: BRAIN AND VENTRICLES: Advanced chronic small vessel ischemic disease, progressed from 04/04/22 . Chronic infarcts in the left and right cerebellum. No acute hemorrhage. No definite acute infarct. No hydrocephalus. No extra-axial collection. No mass effect or midline shift. ORBITS: No acute abnormality. SINUSES: No acute abnormality. SOFT TISSUES AND SKULL: No acute soft tissue abnormality. No skull fracture. IMPRESSION: 1. Progression of advanced chronic small vessel ischemic disease compared to 04/04/22. If there is ongoing concern for acute stroke, MRI without contrast is recommended. 2. Chronic cerebellar infarcts. Electronically signed by: Norman Gatlin MD 05/16/2024 06:55 PM EDT RP Workstation:  [id number]     Procedures   Medications Ordered in the ED  sodium chloride  flush (NS) 0.9 % injection 3 mL (0 mLs Intravenous Hold 05/16/24 2137)                      NIH Stroke Scale: 2              Medical Decision Making Amount and/or Complexity of Data Reviewed Labs: ordered. Radiology: ordered.  Risk Decision regarding hospitalization.    Since because of dysarthria and aphasia has been present since Monday.  According to wife, patient woke up he was having a very hard time speaking to the point time.  Has improved slightly but feels like it is there consistently.  Patient states that he just feels like he is having a very hard time explaining himself in a verbal manner.  He is also experience some slurring of words which wife agrees.  Usually, he is  able to communicate really well obeys been very delayed and slowed here recently.  This all started on Monday spontaneously.  Does endorse a history of CVA.  Currently on a statin, aspirin , 2 different blood pressure medications.  Otherwise, denies any numbness or tingling wear.  No vision changes.  Does endorse episodes of vertigo but currently feeling at baseline some terms of vertigo.  Endorses difficulty walking feels like he is off balance at times.  No fever no chills.  No chest pain or shortness of breath.  Previous medical history reviewed : Patient last seen in the ED on August 2023.  Was seen because of hypertension.  Negative workup at that time   Upon exam, patient hemodynamically stable.  ANO x 3 GCS 15.  No obvious facial droop.  Sensation intact in the face.  No pronator drift.  Normal finger-nose.  No sensory or motor changes of the lower extremities as well.  Patient does have dysarthria and aphasia.  Mild to moderate.  All been present since Monday.  Outside any kind of intervention window.  History of CVA as well.   Did obtain CT scan of the head.  Showed chronic infarcts.  No clear metabolic derangements.  Pending  UA.  But no leukocytosis.  No dysuria.  No large electrolyte derangements.   My biggest concerns this could be subacute CVA.  Has been compliant with his medication.  Spoke to neurology.  Recommended transfer to [facility] ED for MRI of the brain.  Patient and wife is amenable to this.   Spoke to Dr. Rogelia.  Accepted the patient.  Patient will be transported for further care.        Final diagnoses:  Altered mental status, unspecified altered mental status type  Aphasia    ED Discharge Orders     None          Simon Lavonia SAILOR, MD 05/16/24 2248

## 2024-05-16 NOTE — ED Notes (Signed)
Patient made aware of need for urine sample.  Urinal placed at bedside

## 2024-05-16 NOTE — Progress Notes (Signed)
 Hospitalist Transfer Note:    Nursing staff, Please call [facility] Admits & Consults System-Wide number on Amion [phone]) as soon as patient's arrival, so appropriate admitting provider can evaluate the pt.   Transferring facility: [facility] Requesting provider: Dr. Lavonia Pat (EDP at [facility]) Reason for transfer: admission for further evaluation and management of presenting dysarthria, expressive aphasia.    63 year old male with history of prior ischemic CVA, who presented to [facility] ED complaining of persistent slurred speech as well as word finding difficulties starting on Monday, 05/12/2024.   Imaging notable for CT head which showed no evidence of acute intracranial process, including no evidence of intracranial hemorrhage nor any evidence of acute infarct, will also demonstrating chronic cerebellar infarcts.  EDP d/w on-call neurology, Dr. Voncile, who recommended MRI brain and was amenable to ED to ED transfer in order to pursue MRI brain, with prn neurology consult if MRI brain is positive for acute process.   Subsequently, I accepted this patient for transfer for observation to a pcu bed at [facility]  for further work-up and management of the above.      Eva Pore, DO Hospitalist

## 2024-05-16 NOTE — ED Notes (Signed)
 Called [facility] to transport the patient ED To ED.  Patient going to [facility] Emergency for an MRI.  Dr. Rogelia is accepting

## 2024-05-16 NOTE — Plan of Care (Signed)
 ON [facility] NEUROLOGY NOTE  Called by EDP at ED [facility] for dysarthria and possible aphasia since Monday. CTH with significant progression of WM disease and multiple chronic appearing infarcts. An MRI would be beneficial to see if there is any new infarct or what he is having is recrudescence of old symptoms.  Please reach out for formal consultation if MRI is positive for stroke.  Otherwise workup for things that might cause recrudescence of old symptoms including looking for any underlying infection or metabolic derangements per primary team.  -- Eligio Lav, MD Neurologist [facility]

## 2024-05-16 NOTE — ED Notes (Signed)
 Called [facility] to transport the patient to [facility] 3W rm# [id number]

## 2024-05-17 ENCOUNTER — Observation Stay (HOSPITAL_COMMUNITY)

## 2024-05-17 ENCOUNTER — Observation Stay (HOSPITAL_BASED_OUTPATIENT_CLINIC_OR_DEPARTMENT_OTHER)

## 2024-05-17 DIAGNOSIS — I6389 Other cerebral infarction: Secondary | ICD-10-CM | POA: Diagnosis not present

## 2024-05-17 DIAGNOSIS — R29702 NIHSS score 2: Secondary | ICD-10-CM | POA: Diagnosis not present

## 2024-05-17 DIAGNOSIS — Z743 Need for continuous supervision: Secondary | ICD-10-CM | POA: Diagnosis not present

## 2024-05-17 DIAGNOSIS — R569 Unspecified convulsions: Secondary | ICD-10-CM | POA: Diagnosis not present

## 2024-05-17 DIAGNOSIS — R4182 Altered mental status, unspecified: Secondary | ICD-10-CM | POA: Diagnosis present

## 2024-05-17 DIAGNOSIS — R4701 Aphasia: Secondary | ICD-10-CM | POA: Diagnosis present

## 2024-05-17 DIAGNOSIS — R471 Dysarthria and anarthria: Secondary | ICD-10-CM | POA: Diagnosis not present

## 2024-05-17 DIAGNOSIS — I63512 Cerebral infarction due to unspecified occlusion or stenosis of left middle cerebral artery: Secondary | ICD-10-CM

## 2024-05-17 LAB — ECHOCARDIOGRAM COMPLETE
AR max vel: 2.01 cm2
AV Area VTI: 1.93 cm2
AV Area mean vel: 1.78 cm2
AV Mean grad: 4 mmHg
AV Peak grad: 7.3 mmHg
Ao pk vel: 1.35 m/s
Area-P 1/2: 1.91 cm2
Calc EF: 58.8 %
S' Lateral: 3.5 cm
Single Plane A2C EF: 55.2 %
Single Plane A4C EF: 60.9 %

## 2024-05-17 LAB — RAPID URINE DRUG SCREEN, HOSP PERFORMED
Amphetamines: NOT DETECTED
Barbiturates: NOT DETECTED
Benzodiazepines: NOT DETECTED
Cocaine: NOT DETECTED
Opiates: NOT DETECTED
Tetrahydrocannabinol: POSITIVE — AB

## 2024-05-17 LAB — HEMOGLOBIN A1C
Hgb A1c MFr Bld: 5.6 % (ref 4.8–5.6)
Mean Plasma Glucose: 114.02 mg/dL

## 2024-05-17 LAB — HIV ANTIBODY (ROUTINE TESTING W REFLEX): HIV Screen 4th Generation wRfx: NONREACTIVE

## 2024-05-17 MED ORDER — CLOPIDOGREL BISULFATE 75 MG PO TABS
75.0000 mg | ORAL_TABLET | Freq: Every day | ORAL | Status: DC
  Administered 2024-05-17 – 2024-05-20 (×4): 75 mg via ORAL
  Filled 2024-05-17 (×4): qty 1

## 2024-05-17 MED ORDER — STROKE: EARLY STAGES OF RECOVERY BOOK
Freq: Once | Status: AC
  Filled 2024-05-17: qty 1

## 2024-05-17 MED ORDER — IOHEXOL 350 MG/ML SOLN
75.0000 mL | Freq: Once | INTRAVENOUS | Status: AC | PRN
  Administered 2024-05-17: 75 mL via INTRAVENOUS

## 2024-05-17 MED ORDER — LORAZEPAM 1 MG PO TABS: 1.0000 mg | ORAL_TABLET | ORAL | Status: DC | PRN

## 2024-05-17 MED ORDER — TAMSULOSIN HCL 0.4 MG PO CAPS
0.4000 mg | ORAL_CAPSULE | Freq: Every day | ORAL | Status: DC
  Administered 2024-05-17 – 2024-05-20 (×4): 0.4 mg via ORAL
  Filled 2024-05-17 (×4): qty 1

## 2024-05-17 MED ORDER — ASPIRIN 325 MG PO TBEC: 325.0000 mg | DELAYED_RELEASE_TABLET | Freq: Every day | ORAL | Status: DC

## 2024-05-17 MED ORDER — ASPIRIN 81 MG PO TBEC: 81.0000 mg | DELAYED_RELEASE_TABLET | Freq: Every day | ORAL | Status: DC

## 2024-05-17 MED ORDER — ACETAMINOPHEN 325 MG PO TABS
650.0000 mg | ORAL_TABLET | ORAL | Status: DC | PRN
Start: 2024-05-17 — End: 2024-05-20
  Administered 2024-05-18 – 2024-05-20 (×2): 650 mg via ORAL
  Filled 2024-05-17 (×2): qty 2

## 2024-05-17 MED ORDER — ROSUVASTATIN CALCIUM 20 MG PO TABS
20.0000 mg | ORAL_TABLET | Freq: Every day | ORAL | Status: DC
  Administered 2024-05-17 – 2024-05-20 (×4): 20 mg via ORAL
  Filled 2024-05-17 (×4): qty 1

## 2024-05-17 MED ORDER — ENOXAPARIN SODIUM 40 MG/0.4ML IJ SOSY
40.0000 mg | PREFILLED_SYRINGE | INTRAMUSCULAR | Status: DC
  Administered 2024-05-17 – 2024-05-20 (×4): 40 mg via SUBCUTANEOUS
  Filled 2024-05-17 (×4): qty 0.4

## 2024-05-17 MED ORDER — THIAMINE HCL 100 MG/ML IJ SOLN: 100.0000 mg | Freq: Every day | INTRAMUSCULAR | Status: DC

## 2024-05-17 MED ORDER — LOSARTAN POTASSIUM 25 MG PO TABS: 25.0000 mg | ORAL_TABLET | Freq: Every day | ORAL | Status: DC

## 2024-05-17 MED ORDER — ADULT MULTIVITAMIN W/MINERALS CH
1.0000 | ORAL_TABLET | Freq: Every day | ORAL | Status: DC
  Administered 2024-05-17 – 2024-05-20 (×4): 1 via ORAL
  Filled 2024-05-17 (×4): qty 1

## 2024-05-17 MED ORDER — THIAMINE MONONITRATE 100 MG PO TABS
100.0000 mg | ORAL_TABLET | Freq: Every day | ORAL | Status: DC
  Filled 2024-05-17: qty 1

## 2024-05-17 MED ORDER — AMLODIPINE BESYLATE 5 MG PO TABS
10.0000 mg | ORAL_TABLET | Freq: Every day | ORAL | Status: DC
  Administered 2024-05-17 – 2024-05-20 (×4): 10 mg via ORAL
  Filled 2024-05-17 (×4): qty 2

## 2024-05-17 MED ORDER — ACETAMINOPHEN 650 MG RE SUPP: 650.0000 mg | RECTAL | Status: DC | PRN

## 2024-05-17 MED ORDER — FOLIC ACID 1 MG PO TABS
1.0000 mg | ORAL_TABLET | Freq: Every day | ORAL | Status: DC
  Filled 2024-05-17: qty 1

## 2024-05-17 MED ORDER — ACETAMINOPHEN 160 MG/5ML PO SOLN: 650.0000 mg | ORAL | Status: DC | PRN

## 2024-05-17 MED ORDER — LORAZEPAM 2 MG/ML IJ SOLN: 1.0000 mg | INTRAMUSCULAR | Status: DC | PRN

## 2024-05-17 MED ORDER — SODIUM CHLORIDE 0.9 % IV SOLN: INTRAVENOUS | Status: AC

## 2024-05-17 MED ORDER — GADOBUTROL 1 MMOL/ML IV SOLN
8.0000 mL | Freq: Once | INTRAVENOUS | Status: AC | PRN
  Administered 2024-05-17: 8 mL via INTRAVENOUS

## 2024-05-17 MED ORDER — ASPIRIN 81 MG PO TBEC
81.0000 mg | DELAYED_RELEASE_TABLET | Freq: Every day | ORAL | Status: DC
  Administered 2024-05-17 – 2024-05-20 (×4): 81 mg via ORAL
  Filled 2024-05-17 (×4): qty 1

## 2024-05-17 NOTE — H&P (Signed)
 History and Physical    Patient: [name] FMW:[id number] DOB: 06-Apr-1961 DOA: 05/16/2024 DOS: the patient was seen and examined on 05/17/2024 PCP: Hyacinth Honey, NP  Patient coming from: Home-lives with wife  Chief Complaint:  Chief Complaint  Patient presents with   Altered Mental Status   HPI: [name] is a 63 y.o. male with medical history significant of prior CVA, hypertension, remote history of alcohol and cocaine use.  Presented to the [facility] on 9/5 via private vehicle with family reporting increased confusion and expressive aphasia for 2 days.  No obvious unilateral weakness noted.  He reported also some slight blurred vision.  At presentation patient had modest hypertension.  On-call neurology was notified by the ED physician at [facility] regarding patient's symptoms.  CT of the head with significant progression of WM disease and multiple chronic appearing infarcts.  MRI recommended if any abnormalities were found formal neurological consultation recommended.  Hospital service was consulted for admission.  After patient presented to [facility] she was subsequently transferred to the neurological floor at [facility].  On exam he continued to have dysarthria with slow response and speech pattern.  Minimal confusion.  Was found to have decreased right upper extremity flexor sensation 3+/5.  He had decreased sensation of the L UE and L LE.  Had borderline abnormality with right finger-to-nose.  MRI was subsequently completed and this revealed advanced chronic small and medium size vessel ischemia progressed since 2018.  There was superimposed acute ischemia in the left MCA territory as well as more subacute appearing infarct at the right posterior temporal lobe without any hemorrhage or mass effect.  Neurology was formally consulted.  Review of Systems: As mentioned in the history of present illness. All other systems reviewed and are negative.  Patient denied use of alcohol or illegal  substances.  Urine drug screen was positive for THC.  Past Medical History:  Diagnosis Date   Anginal pain (HCC)    Hepatitis B 1991   History of alcohol abuse    History of cocaine abuse (HCC)    Hypercholesterolemia    Hypertension    Mood disorder (HCC)    NOS. Admitted to [facility] for detox in 12/2005   Stroke [facility]) 08/05/2012   denies residual (08/05/2012)   Tobacco abuse    Past Surgical History:  Procedure Laterality Date   MOUTH SURGERY  2011   had to cut tooth out of my gum (08/05/2012)   TEE WITHOUT CARDIOVERSION  08/07/2012   Procedure: TRANSESOPHAGEAL ECHOCARDIOGRAM (TEE);  Surgeon: Aleene JINNY Passe, MD;  Location: [facility] ENDOSCOPY;  Service: Cardiovascular;  Laterality: N/A;   Social History:  reports that he has been smoking cigarettes. He has a 52.5 pack-year smoking history. His smokeless tobacco use includes snuff. He reports current alcohol use of about 2.0 standard drinks of alcohol per week. He reports current drug use. Frequency: 7.00 times per week. Drugs: Marijuana, Heroin, and Cocaine.  Allergies  Allergen Reactions   Lisinopril  Swelling   Doxycycline     Family History  Problem Relation Age of Onset   Hypertension Mother    Osteoarthritis Mother    Hyperlipidemia Mother    Prostate cancer Father    CAD Maternal Grandmother    Lung cancer Paternal Grandfather        tobacco abuse    Prior to Admission medications   Medication Sig Start Date End Date Taking? Authorizing Provider  amLODipine  (NORVASC ) 10 MG tablet Take 1 tablet (10 mg total)  by mouth daily. 04/28/22   Theotis Cameron HERO, PA-C  aspirin  EC 81 MG tablet Take 81 mg by mouth daily. 02/11/24   [provider]  labetalol  (NORMODYNE ) 200 MG tablet Take 1 tablet (200 mg total) by mouth 2 (two) times daily. 04/04/22   Suzette Pac, MD  losartan  (COZAAR ) 25 MG tablet Take 25 mg by mouth daily. 02/11/24   [provider]  tamsulosin  (FLOMAX ) 0.4 MG CAPS capsule Take 1 capsule (0.4 mg  total) by mouth daily. 04/04/22   Suzette Pac, MD    Physical Exam: Vitals:   05/17/24 0432 05/17/24 0500 05/17/24 0603 05/17/24 0817  BP: (!) 147/96 (!) 153/93 (!) 161/81 (!) 150/83  Pulse:   (!) 54 (!) 48  Resp:   18 18  Temp:   97.8 F (36.6 C) 97.9 F (36.6 C)  TempSrc:   Oral Oral  SpO2:   100% 99%   Constitutional: NAD, calm, comfortable Respiratory: clear to auscultation bilaterally, no wheezing, no crackles. Normal respiratory effort. No accessory muscle use.  Cardiovascular: Regular rate and rhythm, no murmurs / rubs / gallops. No extremity edema. 2+ pedal pulses. No carotid bruits.  Abdomen: no tenderness, no masses palpated. No hepatosplenomegaly. Bowel sounds positive.  Musculoskeletal: no clubbing / cyanosis. No joint deformity upper and lower extremities. Good ROM, no contractures. Normal muscle tone.  Skin: no rashes, lesions, ulcers. No induration Neurologic: CN 2-12 grossly intact.  Remains dysarthric.  Sensation intact on right side but mildly decreased on left upper and lower extremities., Strength 5/5 all extremities except for decreased flexor resistance right upper extremity being 3+/5.  Borderline abnormal finger-to-nose involving right upper extremity. Psychiatric: Normal judgment and insight. Alert and oriented x 3. Normal mood.    Data Reviewed:  Sodium 136, potassium 3.8, glucose 97, BUN 8, creatinine 1.01, anion gap 13, LFTs are normal, GFR 60  WBC 9000 with normal differential, hemoglobin 14.1, platelets 248,000  Urinalysis unremarkable  Coags are normal  Ethyl alcohol less than 15  UDS positive for THC  CT head and MRI as above  Assessment and Plan: Acute bilateral ischemic CVA History of prior CVA with progressive small vessel disease Presented with dysarthria and later found to have sensation deficits on the left side and mild motor defects on the right Continue telemetry monitoring to evaluate for arrhythmia specifically atrial  fibrillation CTA head and neck Echocardiogram Hemoglobin A1c and lipid panel= initiate statin pending results of lipid panel Aspirin  325 mg daily; consider Plavix  PT/OT/SLP evaluation Permissive hypertension keeping blood pressure greater than 160/80  Hypertension Hold home antihypertensive agents (Norvasc , labetalol , Cozaar ) to allow for permissive hypertension  BPH Hold Flomax  first 24 hours in regards to meeting requirement for permissive hypertension  Substance abuse Patient denied ongoing use of alcohol or cocaine UDS found to be positive for THC Continues to smoke half a pack per day    Advance Care Planning:   Code Status: Full Code   VTE prophylaxis: Lovenox   Consults: Neurology  Family Communication: Patient only  Severity of Illness: The appropriate patient status for this patient is OBSERVATION. Observation status is judged to be reasonable and necessary in order to provide the required intensity of service to ensure the patient's safety. The patient's presenting symptoms, physical exam findings, and initial radiographic and laboratory data in the context of their medical condition is felt to place them at decreased risk for further clinical deterioration. Furthermore, it is anticipated that the patient will be medically stable for discharge from  the hospital within 2 midnights of admission.   Author: Isaiah Lever, NP 05/17/2024 11:13 AM  For on call review [email or web address].

## 2024-05-17 NOTE — Plan of Care (Signed)
  Problem: Health Behavior/Discharge Planning: Goal: Ability to manage health-related needs will improve Outcome: Progressing   Problem: Clinical Measurements: Goal: Will remain free from infection Outcome: Progressing   Problem: Safety: Goal: Ability to remain free from injury will improve Outcome: Progressing   Problem: Clinical Measurements: Goal: Will remain free from infection Outcome: Progressing

## 2024-05-17 NOTE — Evaluation (Signed)
 Speech Language Pathology Evaluation Patient Details Name: [name] MRN: [id number] DOB: 1961-05-09 Today's Date: 05/17/2024 Time: 9044-8979 SLP Time Calculation (min) (ACUTE ONLY): 25 min  Problem List:  Patient Active Problem List   Diagnosis Date Noted   Dysarthria 05/16/2024   Chest pain 01/23/2019   Abdominal pain 01/23/2019   Hypertensive urgency 01/23/2019   H/O: CVA (cerebrovascular accident) 01/23/2019   Stroke (HCC) 08/05/2012   Hypertension 08/05/2012   Tobacco abuse 08/05/2012   History of cocaine abuse (HCC)    History of alcohol abuse    Past Medical History:  Past Medical History:  Diagnosis Date   Anginal pain (HCC)    Hepatitis B 1991   History of alcohol abuse    History of cocaine abuse (HCC)    Hypercholesterolemia    Hypertension    Mood disorder (HCC)    NOS. Admitted to [facility] for detox in 12/2005   Stroke [name]) 08/05/2012   denies residual (08/05/2012)   Tobacco abuse    Past Surgical History:  Past Surgical History:  Procedure Laterality Date   MOUTH SURGERY  2011   had to cut tooth out of my gum (08/05/2012)   TEE WITHOUT CARDIOVERSION  08/07/2012   Procedure: TRANSESOPHAGEAL ECHOCARDIOGRAM (TEE);  Surgeon: Aleene JINNY Passe, MD;  Location: [facility] ENDOSCOPY;  Service: Cardiovascular;  Laterality: N/A;   HPI:  63 year old male with history of prior ischemic CVA, who presented to [facility] ED complaining of persistent slurred speech as well as word finding difficulties starting on Monday, 05/12/2024.      Imaging notable for CT head which showed no evidence of acute intracranial process, including no evidence of intracranial hemorrhage nor any evidence of acute infarct, will also demonstrating chronic cerebellar infarcts.     ST consulted for speech/language cognitive assessment.   Assessment / Plan / Recommendation Clinical Impression  Pt presents with expressive aphasia with tasks such as responsive naming, convergent/divergent naming and frequent  hesitations/dysfluency noted within conversational speech.  Pt exhibited a cumulative auditory processing delay with portions of [employer] Mental Status Examination (SLUMS) and informal observations/tasks and with portions of Western Aphasia Battery administered.  Deficits noted within areas of language processing with multi-step directives with repetition required to execute tasks (both self and SLP initiated) and slow response time.  Pt noted I want to be able to put my words into motion when speaking.  Orientation accurate within most areas with exception of time with year/month inaccurate.  STM/recall impaired with object recall without mod cueing provided.  Pt's speech observed to be min dysarthric c/b imprecise articulation, but impaired processing and expressive aphasia primary factors impacting expression.  OME unremarkable.  Pt's prior level of functioning unknown as no family available for assessment, but pt did indicate current deficits were not present with prior CVA.  Unsure of accuracy of responses without family input as pt noted living with family prior to this hospitalization.  ST will f/u for aphasia tx/cognitive/linguistic tx during acute stay.  Recommend f/u at next venue of care.  Thank you for this consult.     SLP Assessment  SLP Recommendation/Assessment: Patient needs continued Speech Language Pathology Services SLP Visit Diagnosis: Aphasia (R47.01);Cognitive communication deficit (R41.841)     Assistance Recommended at Discharge  Other (comment) (TBD)  Functional Status Assessment Patient has had a recent decline in their functional status and demonstrates the ability to make significant improvements in function in a reasonable and predictable amount of time.  Frequency and Duration min  2x/week  1 week      SLP Evaluation Cognition  Overall Cognitive Status: No family/caregiver present to determine baseline cognitive functioning Arousal/Alertness:  Awake/alert Orientation Level: Oriented to person;Oriented to place;Oriented to time;Disoriented to time;Oriented to situation Attention: Sustained Sustained Attention: Impaired Sustained Attention Impairment: Verbal basic;Functional basic Memory: Impaired Memory Impairment: Retrieval deficit;Decreased short term memory;Decreased recall of new information Decreased Short Term Memory: Verbal basic;Functional basic Awareness: Impaired Awareness Impairment: Other (comment) Behaviors: Perseveration Comments: Decreased processing of information; slow to respond, repeated directives       Comprehension  Auditory Comprehension Overall Auditory Comprehension: Impaired Commands: Impaired Multistep Basic Commands: 50-74% accurate Conversation: Simple Other Conversation Comments: aphasia/language processing impairment impacting overall Interfering Components: Attention;Processing speed;Working memory EffectiveTechniques: Repetition;Extra processing time Visual Recognition/Discrimination Discrimination: Exceptions to [facility] (able to read environmental signs; did not fully assess reading ability) Reading Comprehension Reading Status: Not tested    Expression Expression Primary Mode of Expression: Verbal Verbal Expression Overall Verbal Expression: Impaired Level of Generative/Spontaneous Verbalization: Sentence Naming: Impairment Responsive: 51-75% accurate Confrontation: Within functional limits Convergent: 25-49% accurate Divergent: 25-49% accurate Verbal Errors: Perseveration;Other (comment) (dysfluent, hesitations) Pragmatics: No impairment Interfering Components: Attention;Other (comment) (unsure of prior level of functioning) Non-Verbal Means of Communication: Not applicable Written Expression Dominant Hand: Right Written Expression: Exceptions to [facility] Interfering Components: Attention;Thought organization   Oral / Motor  Oral Motor/Sensory Function Overall Oral Motor/Sensory  Function: Within functional limits Motor Speech Overall Motor Speech: Other (comment) (DTA) Respiration: Within functional limits Phonation: Normal Resonance: Within functional limits Articulation: Within functional limitis Intelligibility: Intelligible Motor Planning: Not tested Motor Speech Errors: Not applicable            Pat Jayleon Mcfarlane,M.S.,CCC-SLP 05/17/2024, 10:51 AM

## 2024-05-17 NOTE — Consult Note (Signed)
 NEUROLOGY CONSULT NOTE   Date of service: May 17, 2024 Patient Name: [name] MRN:  [id number] DOB:  December 07, 1960 Chief Complaint: Dysarthria and possible aphasia Requesting Provider: Alto Isaiah CROME, NP  History of Present Illness  [name] is a 63 y.o. male with hx of EtOH abuse, cocaine abuse, HLD, HTN, stroke in 2013 without residual deficit, and hepatitis B who presented to [facility] ED 9/5 for evaluation of dysarthria and aphasia since Monday, 05/12/2024 when he woke from sleeping.  Patient was transferred to [facility] for MRI brain with findings of acute ischemia in the left MCA territory with additional subacute appearing cortically based infarct at the right posterior temporal lobe and patient was admitted for further stroke work up and evaluation.   NIHSS components Score: Comment  1a Level of Conscious 0[x]  1[]  2[]  3[]      1b LOC Questions 0[x]  1[]  2[]       1c LOC Commands 0[x]  1[]  2[]       2 Best Gaze 0[x]  1[]  2[]       3 Visual 0[x]  1[]  2[]  3[]      4 Facial Palsy 0[x]  1[]  2[]  3[]      5a Motor Arm - left 0[x]  1[]  2[]  3[]  4[]  UN[]    5b Motor Arm - Right 0[x]  1[]  2[]  3[]  4[]  UN[]    6a Motor Leg - Left 0[x]  1[]  2[]  3[]  4[]  UN[]    6b Motor Leg - Right 0[x]  1[]  2[]  3[]  4[]  UN[]    7 Limb Ataxia 0[x]  1[]  2[]  UN[]      8 Sensory 0[x]  1[]  2[]  UN[]      9 Best Language 0[]  1[x]  2[]  3[]      10 Dysarthria 0[]  1[x]  2[]  UN[]      11 Extinct. and Inattention 0[x]  1[]  2[]       TOTAL:  2    ROS  Comprehensive ROS performed and pertinent positives documented in HPI   Past History   Past Medical History:  Diagnosis Date   Anginal pain (HCC)    Hepatitis B 1991   History of alcohol abuse    History of cocaine abuse (HCC)    Hypercholesterolemia    Hypertension    Mood disorder (HCC)    NOS. Admitted to [facility] for detox in 12/2005   Stroke [facility]) 08/05/2012   denies residual (08/05/2012)   Tobacco abuse    Past Surgical History:  Procedure Laterality Date   MOUTH  SURGERY  2011   had to cut tooth out of my gum (08/05/2012)   TEE WITHOUT CARDIOVERSION  08/07/2012   Procedure: TRANSESOPHAGEAL ECHOCARDIOGRAM (TEE);  Surgeon: Aleene JINNY Passe, MD;  Location: [facility] ENDOSCOPY;  Service: Cardiovascular;  Laterality: N/A;   Family History: Family History  Problem Relation Age of Onset   Hypertension Mother    Osteoarthritis Mother    Hyperlipidemia Mother    Prostate cancer Father    CAD Maternal Grandmother    Lung cancer Paternal Grandfather        tobacco abuse   Social History  reports that he has been smoking cigarettes. He has a 52.5 pack-year smoking history. His smokeless tobacco use includes snuff. He reports current alcohol use of about 2.0 standard drinks of alcohol per week. He reports current drug use. Frequency: 7.00 times per week. Drugs: Marijuana, Heroin, and Cocaine.  Allergies  Allergen Reactions   Lisinopril  Swelling   Doxycycline    Medications   Current Facility-Administered Medications:    [START ON 05/18/2024]  stroke: early stages of recovery  book, , Does not apply, Once, Alto Isaiah CROME, NP   0.9 %  sodium chloride  infusion, , Intravenous, Continuous, Alto Isaiah CROME, NP, Last Rate: 40 mL/hr at 05/17/24 0941, New Bag at 05/17/24 0941   acetaminophen  (TYLENOL ) tablet 650 mg, 650 mg, Oral, Q4H PRN **OR** acetaminophen  (TYLENOL ) 160 MG/5ML solution 650 mg, 650 mg, Per Tube, Q4H PRN **OR** acetaminophen  (TYLENOL ) suppository 650 mg, 650 mg, Rectal, Q4H PRN, Alto Isaiah CROME, NP   enoxaparin  (LOVENOX ) injection 40 mg, 40 mg, Subcutaneous, Q24H, Alto Isaiah CROME, NP, 40 mg at 05/17/24 0941   multivitamin with minerals tablet 1 tablet, 1 tablet, Oral, Daily, Alto Isaiah CROME, NP, 1 tablet at 05/17/24 0941   sodium chloride  flush (NS) 0.9 % injection 3 mL, 3 mL, Intravenous, Once, Simon Lavonia SAILOR, MD  Vitals   Vitals:   05/17/24 0432 05/17/24 0500 05/17/24 0603 05/17/24 0817  BP: (!) 147/96 (!) 153/93 (!) 161/81 (!) 150/83  Pulse:    (!) 54 (!) 48  Resp:   18 18  Temp:   97.8 F (36.6 C) 97.9 F (36.6 C)  TempSrc:   Oral Oral  SpO2:   100% 99%    There is no height or weight on file to calculate BMI.  Physical Exam   Constitutional: Appears well-developed and well-nourished, in no acute distress Psych: Affect appropriate to situation. He is calm and cooperative with exam Eyes: No scleral injection.  HENT: No OP obstruction.  Head: Normocephalic.  Cardiovascular: Normal rate and regular rhythm.  Respiratory: Effort normal, non-labored breathing on room air GI: Soft.  No distension. There is no tenderness.  Skin: WDI.   Neurologic Examination  Mental Status: Patient is awake, alert, oriented to self, place, time, and situation.  Throughout conversation he occasionally seems to have some slight confusion requiring repeat questioning and some prompting.  He initially states the year is 2022 but when asked again, he corrects to 2025.  He seems to have some trouble understanding certain questions and needs them rephrased at times.  He states he woke up a few days ago and I lost my words.  He is able to name objects and repeat phrases without error and describe a picture accurately though during conversation he has fluent aphasia with stuttering and occasional word finding difficulties.  Speech may have some subtle dysarthria though not overt to examiner, patient reports feeling that his speech is slurred from baseline.  Cranial Nerves: II: Visual Fields are full. Pupils are equal, round, and reactive to light.   III,IV, VI: EOMI without ptosis or diploplia.  V: Facial sensation is intact and symmetric to light touch VII: Face is symmetric resting and with movement VIII: Hearing is intact to voice X: Palate elevates symmetrically XI: Shoulder shrug is symmetric. XII: Tongue protrudes midline Motor: Tone is normal. Bulk is normal. 5/5 strength was present in all four extremities without unilateral weakness or  vertical drift. Sensory: Patient reports that sensation is intact and symmetric to light touch in the arms and legs.  No extinction to DDS is present (despite some left/right confusion, he points correctly to the correct limb consistently) Plantars: Toes are downgoing bilaterally.  Cerebellar: FNF and HKS are intact bilaterally  Labs/Imaging/Neurodiagnostic studies   CBC:  Recent Labs  Lab June 07, 2024 1805  WBC 9.0  NEUTROABS 5.5  HGB 14.1  HCT 40.7  MCV 89.3  PLT 248   Basic Metabolic Panel:  Lab Results  Component Value Date   NA 136 06-07-2024  K 3.8 05/16/2024   CO2 22 05/16/2024   GLUCOSE 97 05/16/2024   BUN 8 05/16/2024   CREATININE 1.01 05/16/2024   CALCIUM  10.0 05/16/2024   GFRNONAA >60 05/16/2024   GFRAA >60 01/23/2019   Lipid Panel:  Lab Results  Component Value Date   LDLCALC 120 (H) 03/15/2021   HgbA1c:  Lab Results  Component Value Date   HGBA1C 5.5 08/05/2012   Urine Drug Screen:     Component Value Date/Time   LABOPIA NONE DETECTED 05/17/2024 0720   COCAINSCRNUR NONE DETECTED 05/17/2024 0720   LABBENZ NONE DETECTED 05/17/2024 0720   AMPHETMU NONE DETECTED 05/17/2024 0720   THCU POSITIVE (A) 05/17/2024 0720   LABBARB NONE DETECTED 05/17/2024 0720    Alcohol Level     Component Value Date/Time   ETH <15 05/16/2024 1805   INR  Lab Results  Component Value Date   INR 1.1 05/16/2024   APTT  Lab Results  Component Value Date   APTT 35 05/16/2024   Drugs of Abuse     Component Value Date/Time   LABOPIA NONE DETECTED 05/17/2024 0720   COCAINSCRNUR NONE DETECTED 05/17/2024 0720   LABBENZ NONE DETECTED 05/17/2024 0720   AMPHETMU NONE DETECTED 05/17/2024 0720   THCU POSITIVE (A) 05/17/2024 0720   LABBARB NONE DETECTED 05/17/2024 0720    CT Head without contrast 05/16/24 (Personally reviewed): 1. Progression of advanced chronic small vessel ischemic disease compared to 04/04/22. If there is ongoing concern for acute stroke, MRI without  contrast is recommended. 2. Chronic cerebellar infarcts.  MRI Brain (Personally reviewed): 1. Advanced chronic small and medium-sized vessel ischemia with substantial progression since a 2018 MRI.   2. Superimposed Acute Ischemia in the Left MCA territory: - confluent lacunar infarct left corona radiata and lentiform. - small cortically based infarct left inferior frontal gyrus   AND additional more subacute appearing cortically based infarct at the Right posterior temporal lobe. No associated hemorrhage or mass effect at any location. Mild post ischemic enhancement of the temporal lobe infarct.  ASSESSMENT   [name] is a 63 y.o. male with PMHx of EtOH and cocaine abuse, HTN, tobacco use, HLD, CVA in 2013 left cerebellar infarcts presenting with dizziness and headache without residual symptoms, and a history of medical noncompliance with medications who presented initially to [facility] ED on 9/5 for evaluation of speech disturbance since Monday, 05/12/2024.  Patient states I lost my words.  Workup revealed MRI with left MCA infarct and subacute cortically based infarct on the right posterior temporal lobe and patient was admitted for further stroke work up.   RECOMMENDATIONS  - HgbA1c, fasting lipid panel - Frequent neuro checks - Echocardiogram - Outside of permissive hypertension window with symptoms since Monday, goal blood pressure gradual normotension - Prophylactic therapy- Antiplatelet med: Aspirin  81 mg PO daily and clopidogrel  75 mg PO daily  - Risk factor modification - Telemetry monitoring - PT consult, OT consult, Speech consult - Stroke team to follow - Routine EEG ______________________________________________________________________  Signed,  Mimi LELON Ny, NP [facility]  ATTENDING ATTESTATION:  Subacute cortically based right posterior temporal lobe infarct will get EEG due to confusion episodes to ensure there is no cortical irritation.   Stroke workup as above stroke team to follow.  Dr. Nichola evaluated pt independently, reviewed imaging, chart, labs. Discussed and formulated plan with the Resident/APP. Changes were made to the note where appropriate. Please see APP/resident note above for details.    Dannell Gortney,MD

## 2024-05-17 NOTE — ED Notes (Addendum)
 This RN spoke with the patient and wife after they called out upset about the wait. Patient states this is fucking ridiculous, I have been here waiting all night and I'm tired of it. Explained to patient that he does have a bed and we are just waiting on transport. Explained to patient that we have had a large number of admissions tonight and there are only two trucks to transport all of the admissions. Patient and wife still unhappy about the wait. This RN asked if there is anything I can do to make them more comfortable and they both denied needing anything at this time.

## 2024-05-17 NOTE — Procedures (Signed)
 History: 63 year old male with a history of stroke being evaluated for speech disturbance, EEG to rule out seizures  EEG Duration: 29 minutes  Sedation: None  Patient State: Awake and asleep  Technique: This EEG was acquired with electrodes placed according to the International 10-20 electrode system (including Fp1, Fp2, F3, F4, C3, C4, P3, P4, O1, O2, T3, T4, T5, T6, A1, A2, Fz, Cz, Pz). The following electrodes were missing or displaced: none.   Background: The background consists of intermixed alpha and beta activities. There is a well defined posterior dominant rhythm of 8-9 hz that attenuates with eye opening. Sleep is recorded with normal appearing structures.   Photic stimulation: Physiologic driving is not performed  EEG Abnormalities: None  Clinical Interpretation: This normal EEG is recorded in the waking and sleep state. There was no seizure or seizure predisposition recorded on this study. Please note that lack of epileptiform activity on EEG does not preclude the possibility of epilepsy.   Aisha Seals, MD [facility]   If 7pm- 7am, please page neurology on call as listed in AMION.

## 2024-05-17 NOTE — Progress Notes (Signed)
 Routine EEG completed, results pending Neurology review and interpretation

## 2024-05-17 NOTE — Progress Notes (Signed)
 Received pt via stretcher from [facility], alert and oriented X 3, follows commands, no complaint of pain. Oriented to room and surroundings, hooked to tele monitoring, placed call bell within reach.

## 2024-05-18 DIAGNOSIS — I7090 Unspecified atherosclerosis: Secondary | ICD-10-CM

## 2024-05-18 DIAGNOSIS — F1010 Alcohol abuse, uncomplicated: Secondary | ICD-10-CM

## 2024-05-18 DIAGNOSIS — I6381 Other cerebral infarction due to occlusion or stenosis of small artery: Secondary | ICD-10-CM | POA: Diagnosis not present

## 2024-05-18 DIAGNOSIS — E785 Hyperlipidemia, unspecified: Secondary | ICD-10-CM

## 2024-05-18 DIAGNOSIS — I110 Hypertensive heart disease with heart failure: Secondary | ICD-10-CM

## 2024-05-18 DIAGNOSIS — R29701 NIHSS score 1: Secondary | ICD-10-CM

## 2024-05-18 DIAGNOSIS — F17210 Nicotine dependence, cigarettes, uncomplicated: Secondary | ICD-10-CM

## 2024-05-18 DIAGNOSIS — I6350 Cerebral infarction due to unspecified occlusion or stenosis of unspecified cerebral artery: Secondary | ICD-10-CM | POA: Diagnosis not present

## 2024-05-18 DIAGNOSIS — F1210 Cannabis abuse, uncomplicated: Secondary | ICD-10-CM

## 2024-05-18 DIAGNOSIS — I69391 Dysphagia following cerebral infarction: Secondary | ICD-10-CM

## 2024-05-18 DIAGNOSIS — I509 Heart failure, unspecified: Secondary | ICD-10-CM

## 2024-05-18 DIAGNOSIS — I739 Peripheral vascular disease, unspecified: Secondary | ICD-10-CM | POA: Diagnosis not present

## 2024-05-18 DIAGNOSIS — R471 Dysarthria and anarthria: Secondary | ICD-10-CM | POA: Diagnosis not present

## 2024-05-18 LAB — MRSA NEXT GEN BY PCR, NASAL: MRSA by PCR Next Gen: NOT DETECTED

## 2024-05-18 LAB — LIPID PANEL
Cholesterol: 162 mg/dL (ref 0–200)
HDL: 26 mg/dL — ABNORMAL LOW (ref >40)
LDL Cholesterol: 95 mg/dL (ref 0–99)
Total CHOL/HDL Ratio: 6.2 ratio
Triglycerides: 204 mg/dL — ABNORMAL HIGH (ref <150)
VLDL: 41 mg/dL — ABNORMAL HIGH (ref 0–40)

## 2024-05-18 MED ORDER — LABETALOL HCL 200 MG PO TABS
200.0000 mg | ORAL_TABLET | Freq: Two times a day (BID) | ORAL | Status: DC
  Administered 2024-05-18 – 2024-05-20 (×4): 200 mg via ORAL
  Filled 2024-05-18 (×4): qty 1

## 2024-05-18 NOTE — Progress Notes (Signed)
 PROGRESS NOTE    [name]  FMW:[id number] DOB: Jun 28, 1961 DOA: 05/16/2024 PCP: [name]   Brief Narrative:   [name] is a 63 y.o. male with medical history significant of prior CVA, hypertension, remote history of alcohol and cocaine use.  Presented to the [facility] on 9/5 via private vehicle with family reporting increased confusion and expressive aphasia for 2 days.  No obvious unilateral weakness noted.  He reported also some slight blurred vision.  Out of thrombolytic window.  Found to have acute L MCA territory stroke with more subacute appearing infarct in the right posterior temporal lobe without any hemorrhage or mass effect.  Assessment & Plan:   Principal Problem:   Dysarthria      Assessment and Plan: Acute bilateral ischemic CVA History of prior CVA with progressive small vessel disease Presented with dysarthria and later found to have sensation deficits on the left side and mild motor defects on the right    Patient does have word finding difficulties.  No focal motor deficits noted.  Patient admitted for a stroke workup.  MRI of the brain reveals acute ischemia in the left MCA territory and additionally subacute appearing cortical-based infarct at the right posterior temporal lobe, no hemorrhage.  No mass effect.  Other chronic ischemia noted. CTA shows right M3 occlusion.   Patient also underwent EEG which was negative for any epileptiform tendency. Continue telemetry monitoring to evaluate for arrhythmia specifically atrial fibrillation Echocardiogram-LVEF 50 to 55%, no PFO or thrombus. Continue aspirin  81 mg daily, Plavix , Crestor   Hemoglobin A1c 5.6 PT/OT/SLP evaluation Patient will be enrolled in sleep smart trial.  Discussed with Dr. Rosemarie     Hypertension Hold home antihypertensive agents (Norvasc , labetalol ,    BPH Continue Flomax    Substance abuse Patient denied ongoing use of alcohol or cocaine UDS found to be positive for  THC Continues to smoke half a pack per day       Advance Care Planning:   Code Status: Full Code    VTE prophylaxis: Lovenox    Consults: Neurology   Family Communication: Patient only   Severity of Illness: The appropriate patient status for this patient is OBSERVATION. Observation status is judged to be reasonable and necessary in order to provide the required intensity of service to ensure the patient's safety. The patient's presenting symptoms, physical exam findings, and initial radiographic and laboratory data in the context of their medical condition is felt to place them at decreased risk for further clinical deterioration. Furthermore, it is anticipated that the patient will be medically stable for discharge from the hospital within 2 midnights of admis    Consultants:   Procedures:   Antimicrobials:    Subjective:   Objective: Vitals:   05/18/24 0402 05/18/24 0746 05/18/24 1126 05/18/24 1523  BP: (!) 152/84 (!) 145/88 (!) 144/76 133/78  Pulse: 73 78 71 66  Resp: 19 14 16 16   Temp: 98.4 F (36.9 C) 99.6 F (37.6 C) 100 F (37.8 C) 98.5 F (36.9 C)  TempSrc:  Oral Oral Oral  SpO2: (!) 88% 95% 94% 98%    Intake/Output Summary (Last 24 hours) at 05/18/2024 1615 Last data filed at 05/18/2024 9286 Gross per 24 hour  Intake 360 ml  Output 600 ml  Net -240 ml   There were no vitals filed for this visit.  Examination:  General exam: Appears calm and comfortable  Respiratory system: Bilateral decreased breath sounds at bases Cardiovascular system: S1 & S2 heard, Rate  controlled Gastrointestinal system: Abdomen is nondistended, soft and nontender. Normal bowel sounds heard. Extremities: No cyanosis, clubbing, edema  Central nervous system: Alert and oriented. No focal neurological deficits. Moving extremities Skin: No rashes, lesions or ulcers Psychiatry: Judgement and insight appear normal. Mood & affect appropriate.     Data Reviewed: I have personally  reviewed following labs and imaging studies  CBC: Recent Labs  Lab 05/16/24 1805  WBC 9.0  NEUTROABS 5.5  HGB 14.1  HCT 40.7  MCV 89.3  PLT 248   Basic Metabolic Panel: Recent Labs  Lab 05/16/24 1805  NA 136  K 3.8  CL 101  CO2 22  GLUCOSE 97  BUN 8  CREATININE 1.01  CALCIUM  10.0   GFR: CrCl cannot be calculated (Unknown ideal weight.). Liver Function Tests: Recent Labs  Lab 05/16/24 1805  AST 20  ALT 11  ALKPHOS 91  BILITOT 0.5  PROT 8.0  ALBUMIN 4.5   No results for input(s): LIPASE, AMYLASE in the last 168 hours. No results for input(s): AMMONIA in the last 168 hours. Coagulation Profile: Recent Labs  Lab 05/16/24 1805  INR 1.1   Cardiac Enzymes: No results for input(s): CKTOTAL, CKMB, CKMBINDEX, TROPONINI in the last 168 hours. BNP (last 3 results) No results for input(s): PROBNP in the last 8760 hours. HbA1C: Recent Labs    05/17/24 1205  HGBA1C 5.6   CBG: No results for input(s): GLUCAP in the last 168 hours. Lipid Profile: Recent Labs    05/18/24 0554  CHOL 162  HDL 26*  LDLCALC 95  TRIG 795*  CHOLHDL 6.2   Thyroid Function Tests: No results for input(s): TSH, T4TOTAL, FREET4, T3FREE, THYROIDAB in the last 72 hours. Anemia Panel: No results for input(s): VITAMINB12, FOLATE, FERRITIN, TIBC, IRON, RETICCTPCT in the last 72 hours. Sepsis Labs: No results for input(s): PROCALCITON, LATICACIDVEN in the last 168 hours.  No results found for this or any previous visit (from the past 240 hours).       Radiology Studies: ECHOCARDIOGRAM COMPLETE Result Date: 05/17/2024    ECHOCARDIOGRAM REPORT   Patient Name:   [name] Date of Exam: 05/17/2024 Medical Rec #:  [id number]      Height:       67.0 in Accession #:    [id number]     Weight:       180.0 lb Date of Birth:  1961-01-30      BSA:          1.934 m Patient Age:    63 years       BP:           150/83 mmHg Patient Gender: M               HR:           48 bpm. Exam Location:  Inpatient Procedure: 2D Echo, Cardiac Doppler and Color Doppler (Both Spectral and Color            Flow Doppler were utilized during procedure). Indications:    Stroke I63.9  History:        Patient has prior history of Echocardiogram examinations, most                 recent 01/24/2019. Stroke; Risk Factors:Current Smoker,                 Dyslipidemia and Hypertension.  Sonographer:    BERNARDA ROCKS Referring Phys: ALLISON L ELLIS IMPRESSIONS  1. Low normal to  mildly reduced LV function (EF 50).  2. Left ventricular ejection fraction, by estimation, is 50 to 55%. The left ventricle has low normal function. The left ventricle has no regional wall motion abnormalities. The left ventricular internal cavity size was mildly dilated. Left ventricular diastolic parameters are consistent with Grade I diastolic dysfunction (impaired relaxation).  3. Right ventricular systolic function is normal. The right ventricular size is normal. There is normal pulmonary artery systolic pressure.  4. Left atrial size was mildly dilated.  5. The mitral valve is normal in structure. Trivial mitral valve regurgitation. No evidence of mitral stenosis.  6. The aortic valve is tricuspid. Aortic valve regurgitation is trivial. Aortic valve sclerosis is present, with no evidence of aortic valve stenosis.  7. Aortic dilatation noted. There is mild dilatation of the ascending aorta, measuring 42 mm.  8. The inferior vena cava is normal in size with greater than 50% respiratory variability, suggesting right atrial pressure of 3 mmHg. FINDINGS  Left Ventricle: Left ventricular ejection fraction, by estimation, is 50 to 55%. The left ventricle has low normal function. The left ventricle has no regional wall motion abnormalities. The left ventricular internal cavity size was mildly dilated. There is no left ventricular hypertrophy. Left ventricular diastolic parameters are consistent with Grade I diastolic  dysfunction (impaired relaxation). Right Ventricle: The right ventricular size is normal. Right ventricular systolic function is normal. There is normal pulmonary artery systolic pressure. Left Atrium: Left atrial size was mildly dilated. Right Atrium: Right atrial size was normal in size. Pericardium: There is no evidence of pericardial effusion. Mitral Valve: The mitral valve is normal in structure. Mild mitral annular calcification. Trivial mitral valve regurgitation. No evidence of mitral valve stenosis. Tricuspid Valve: The tricuspid valve is normal in structure. Tricuspid valve regurgitation is trivial. No evidence of tricuspid stenosis. Aortic Valve: The aortic valve is tricuspid. Aortic valve regurgitation is trivial. Aortic valve sclerosis is present, with no evidence of aortic valve stenosis. Pulmonic Valve: The pulmonic valve was normal in structure. Pulmonic valve regurgitation is not visualized. No evidence of pulmonic stenosis. Aorta: Aortic dilatation noted. There is mild dilatation of the ascending aorta, measuring 42 mm. Venous: The inferior vena cava is normal in size with greater than 50% respiratory variability, suggesting right atrial pressure of 3 mmHg. IAS/Shunts: No atrial level shunt detected by color flow Doppler. Additional Comments: Low normal to mildly reduced LV function (EF 50).  LEFT VENTRICLE PLAX 2D LVIDd:         5.50 cm      Diastology LVIDs:         3.50 cm      LV e' medial:    6.09 cm/s LV PW:         0.90 cm      LV E/e' medial:  8.2 LV IVS:        0.90 cm      LV e' lateral:   7.62 cm/s LVOT diam:     2.00 cm      LV E/e' lateral: 6.5 LV SV:         55 LV SV Index:   28 LVOT Area:     3.14 cm  LV Volumes (MOD) LV vol d, MOD A2C: 184.0 ml LV vol d, MOD A4C: 200.0 ml LV vol s, MOD A2C: 82.5 ml LV vol s, MOD A4C: 78.2 ml LV SV MOD A2C:     101.5 ml LV SV MOD A4C:     200.0 ml  LV SV MOD BP:      118.8 ml RIGHT VENTRICLE             IVC RV Basal diam:  3.30 cm     IVC diam: 1.90  cm RV S prime:     10.90 cm/s TAPSE (M-mode): 2.7 cm LEFT ATRIUM             Index        RIGHT ATRIUM           Index LA diam:        3.30 cm 1.71 cm/m   RA Area:     17.00 cm LA Vol (A2C):   81.2 ml 41.99 ml/m  RA Volume:   40.30 ml  20.84 ml/m LA Vol (A4C):   38.3 ml 19.81 ml/m LA Biplane Vol: 60.0 ml 31.03 ml/m  AORTIC VALVE                    PULMONIC VALVE AV Area (Vmax):    2.01 cm     PV Vmax:       0.64 m/s AV Area (Vmean):   1.78 cm     PV Peak grad:  1.6 mmHg AV Area (VTI):     1.93 cm AV Vmax:           135.00 cm/s AV Vmean:          90.600 cm/s AV VTI:            0.285 m AV Peak Grad:      7.3 mmHg AV Mean Grad:      4.0 mmHg LVOT Vmax:         86.20 cm/s LVOT Vmean:        51.200 cm/s LVOT VTI:          0.175 m LVOT/AV VTI ratio: 0.61  AORTA Ao Root diam: 3.80 cm Ao Asc diam:  4.20 cm MITRAL VALVE MV Area (PHT): 1.91 cm    SHUNTS MV Decel Time: 398 msec    Systemic VTI:  0.18 m MV E velocity: 49.70 cm/s  Systemic Diam: 2.00 cm MV A velocity: 73.40 cm/s MV E/A ratio:  0.68 Redell Shallow MD Electronically signed by Redell Shallow MD Signature Date/Time: 05/17/2024/4:05:49 PM    Final    CT ANGIO HEAD NECK W WO CM Result Date: 05/17/2024 CLINICAL DATA:  Stroke/TIA, determine embolic source. EXAM: CT ANGIOGRAPHY HEAD AND NECK WITH AND WITHOUT CONTRAST TECHNIQUE: Multidetector CT imaging of the head and neck was performed using the standard protocol during bolus administration of intravenous contrast. Multiplanar CT image reconstructions and MIPs were obtained to evaluate the vascular anatomy. Carotid stenosis measurements (when applicable) are obtained utilizing NASCET criteria, using the distal internal carotid diameter as the denominator. RADIATION DOSE REDUCTION: This exam was performed according to the departmental dose-optimization program which includes automated exposure control, adjustment of the mA and/or kV according to patient size and/or use of iterative reconstruction technique.  CONTRAST:  75mL OMNIPAQUE  IOHEXOL  350 MG/ML SOLN COMPARISON:  Head CT 05/16/2024 and MRI 05/17/2024. Head MRA 08/05/2012. FINDINGS: CT HEAD FINDINGS Brain: As shown on today's MRI, there is an acute infarct involving the left basal ganglia and corona radiata and a more subacute appearing infarct laterally in the posterior right temporal lobe. There is a background of advanced chronic small vessel ischemia involving the cerebral white matter and deep gray nuclei as well as brainstem, and there are chronic left larger than right cerebellar  infarcts. There is mild cerebral atrophy. No acute intracranial hemorrhage, mass, midline shift, or extra-axial fluid collection is identified. Vascular: Calcified atherosclerosis at the skull base. Skull: No fracture or suspicious lesion. Sinuses/Orbits: Paranasal sinuses and mastoid air cells are clear. Unremarkable orbits. Other: None. Review of the MIP images confirms the above findings CTA NECK FINDINGS Aortic arch: Normal variant aortic arch branching pattern with common origin of the brachiocephalic and left common carotid arteries. Mild atherosclerosis. No significant stenosis of the brachiocephalic or subclavian arteries. Right carotid system: Patent with moderate, predominantly calcified atherosclerosis throughout the common carotid artery and about the carotid bifurcation without evidence of a significant stenosis or dissection. Left carotid system: Patent with mild to moderate, predominantly calcified plaque in the mid common carotid artery and about the carotid bifurcation without evidence of a significant stenosis or dissection. Tortuous mid cervical ICA. Vertebral arteries: Patent with the right being mildly dominant. Mild diffuse atherosclerotic irregularity bilaterally and calcified atherosclerosis at both vertebral artery origins without evidence of a flow limiting stenosis. Skeleton: Mild cervical spondylosis. Other neck: No evidence of cervical lymphadenopathy or  mass. Upper chest: Mild emphysema. Review of the MIP images confirms the above findings CTA HEAD FINDINGS Anterior circulation: The internal carotid arteries are patent from skull base to carotid termini with mild atherosclerosis bilaterally not resulting in a significant stenosis. ACAs and MCAs are patent with branch vessel atherosclerosis but no evidence of a proximal branch occlusion or significant A1 or M1 stenosis. There is occlusion of a right M3 branch in the posterior right temporal region corresponding to the subacute infarct on MRI. No aneurysm is identified. Posterior circulation: The intracranial vertebral arteries are widely patent to the basilar. The right PICA appears patent proximally. A left PICA is again not visualized and may be chronically occluded. The basilar artery is patent with a new mild stenosis proximally. There is a new severe stenosis of the proximal left SCA. There are small right and large left posterior communicating arteries, likely with a severe stenosis of the distal right posterior communicating artery. There is also a severe stenosis of the distal left P1 segment. No aneurysm is identified. Venous sinuses: As permitted by contrast timing, patent. Anatomic variants: None of significance. Review of the MIP images confirms the above findings IMPRESSION: 1. Occlusion of a right M3 branch corresponding to the known subacute infarct in the posterior right temporal lobe. 2. Intracranial atherosclerosis including severe stenoses of the proximal left SCA, right posterior communicating artery, and left P1 segment and a mild stenosis of the basilar artery. 3. Cervical carotid atherosclerosis without significant stenosis. 4. Aortic Atherosclerosis (ICD10-I70.0) and Emphysema (ICD10-J43.9). Electronically Signed   By: Dasie Hamburg M.D.   On: 05/17/2024 13:25   MR BRAIN W WO CONTRAST Result Date: 05/17/2024 CLINICAL DATA:  63 year old male with neurologic deficit. Evidence of cerebral small  vessel disease by CT. EXAM: MRI HEAD WITHOUT AND WITH CONTRAST TECHNIQUE: Multiplanar, multiecho pulse sequences of the brain and surrounding structures were obtained without and with intravenous contrast. CONTRAST:  8mL GADAVIST  GADOBUTROL  1 MMOL/ML IV SOLN COMPARISON:  Head CT yesterday.  Brain MRI 04/16/2017. FINDINGS: Brain: Advanced chronic small vessel disease. Superimposed confluent restricted diffusion tracking from the left corona radiata into the lentiform, slightly greater than 2 cm size (series 3, image 22). T2 and FLAIR hyperintensity there. No hemorrhage or mass effect. No enhancement. Additional small area of left middle frontal gyrus cortical and subcortical white matter diffusion restriction (series 2, image 40 and series 3,  image 24) in an area 8-9 mm. T2 and FLAIR hyperintensity. No hemorrhage or mass effect. No enhancement. Patchy superimposed restricted diffusion also in the posterior right temporal lobe, junction with the anterolateral right occipital lobe in an area of about 2.5 cm. Similar patchy, confluent T2 and FLAIR hyperintensity. No hemorrhage or mass effect. But this area does demonstrate patchy and gyriform post ischemic enhancement (coronal images 13 and 14). Sizable chronic infarct in the left cerebellum PICA territory is chronic and was present in 2018. Progressed since that time patchy and confluent chronic infarcts in the contralateral right PICA territory, lacunar infarcts throughout bilateral central brainstem (series 5, image 10), bilateral basal ganglia, and in the central thalami (series 8, image 56). Limited chronic microhemorrhage on SWI, in the left thalamus. Progressed Patchy and confluent bilateral cerebral white matter T2 and FLAIR hyperintensity, now widespread. Small area of chronic cortical encephalomalacia anterior left inferior frontal gyrus on series 9, image 29 was present in 2018. And no other significant cerebral cortical encephalomalacia. No other abnormal  intracranial enhancement. No dural thickening. No midline shift, mass effect, evidence of mass lesion, ventriculomegaly, extra-axial collection or acute intracranial hemorrhage. Cervicomedullary junction and pituitary are within normal limits. Vascular: Major intracranial vascular flow voids are preserved, stable since 2018. Chronic intracranial artery tortuosity. Following contrast major dural venous sinuses are enhancing and appear to be patent. Skull and upper cervical spine: Negative. Visualized bone marrow signal is within normal limits. Sinuses/Orbits: Negative orbits. Paranasal Visualized paranasal sinuses and mastoids are stable and well aerated. Other: Visible internal auditory structures appear normal. Negative visible scalp and face. IMPRESSION: 1. Advanced chronic small and medium-sized vessel ischemia with substantial progression since a 2018 MRI. 2. Superimposed Acute Ischemia in the Left MCA territory: - confluent lacunar infarct left corona radiata and lentiform. - small cortically based infarct left inferior frontal gyrus AND additional more subacute appearing cortically based infarct at the Right posterior temporal lobe. No associated hemorrhage or mass effect at any location. Mild post ischemic enhancement of the temporal lobe infarct. Electronically Signed   By: VEAR Hurst M.D.   On: 05/17/2024 09:38   CT HEAD WO CONTRAST Result Date: 05/16/2024 EXAM: CT HEAD WITHOUT CONTRAST 05/16/2024 06:45:06 PM TECHNIQUE: CT of the head was performed without the administration of intravenous contrast. Automated exposure control, iterative reconstruction, and/or weight based adjustment of the mA/kV was utilized to reduce the radiation dose to as low as reasonably achievable. COMPARISON: CT Head dated 04/04/22 was used for comparison. CLINICAL HISTORY: Neuro deficit, acute, stroke suspected. FINDINGS: BRAIN AND VENTRICLES: Advanced chronic small vessel ischemic disease, progressed from 04/04/22 . Chronic infarcts  in the left and right cerebellum. No acute hemorrhage. No definite acute infarct. No hydrocephalus. No extra-axial collection. No mass effect or midline shift. ORBITS: No acute abnormality. SINUSES: No acute abnormality. SOFT TISSUES AND SKULL: No acute soft tissue abnormality. No skull fracture. IMPRESSION: 1. Progression of advanced chronic small vessel ischemic disease compared to 04/04/22. If there is ongoing concern for acute stroke, MRI without contrast is recommended. 2. Chronic cerebellar infarcts. Electronically signed by: Norman Gatlin MD 05/16/2024 06:55 PM EDT RP Workstation: [id number]        Scheduled Meds:  amLODipine   10 mg Oral Daily   aspirin  EC  81 mg Oral Daily   clopidogrel   75 mg Oral Daily   enoxaparin  (LOVENOX ) injection  40 mg Subcutaneous Q24H   multivitamin with minerals  1 tablet Oral Daily   rosuvastatin   20 mg Oral Daily  sodium chloride  flush  3 mL Intravenous Once   tamsulosin   0.4 mg Oral Daily   Continuous Infusions:        Guy Toney, MD [facility] 05/18/2024, 4:15 PM

## 2024-05-18 NOTE — Progress Notes (Signed)
 OT Cancellation Note  Patient Details Name: [name] MRN: [id number] DOB: 1961/01/03   Cancelled Treatment:    Reason Eval/Treat Not Completed: Other (comment). Pt in middle of eating lunch. Will try back as schedule allows.  Donny BECKER OT Acute Rehabilitation Services Office [phone]    [place] Dorothyann Distel 05/18/2024, 2:04 PM

## 2024-05-18 NOTE — Progress Notes (Addendum)
 STROKE TEAM PROGRESS NOTE   INTERIM HISTORY/SUBJECTIVE  No family at the bedside.  Patient is laying in bed in no apparent distress.  Patient states that a week ago he started having trouble speaking and felt he did not need to come be evaluated until yesterday. MRI with acute lacunar infarct in left corona radiata and lentiform and late subacute right posterior temporal lobe CT angiogram shows diffuse intracranial atherosclerosis.  LDL cholesterol is elevated at 95 mg percent and hemoglobin A1c is 5.6.  Urine drug screen positive for marijuana CBC    Component Value Date/Time   WBC 9.0 05/16/2024 1805   RBC 4.56 05/16/2024 1805   HGB 14.1 05/16/2024 1805   HCT 40.7 05/16/2024 1805   PLT 248 05/16/2024 1805   MCV 89.3 05/16/2024 1805   MCH 30.9 05/16/2024 1805   MCHC 34.6 05/16/2024 1805   RDW 13.8 05/16/2024 1805   LYMPHSABS 2.9 05/16/2024 1805   MONOABS 0.5 05/16/2024 1805   EOSABS 0.0 05/16/2024 1805   BASOSABS 0.0 05/16/2024 1805    BMET    Component Value Date/Time   NA 136 05/16/2024 1805   K 3.8 05/16/2024 1805   CL 101 05/16/2024 1805   CO2 22 05/16/2024 1805   GLUCOSE 97 05/16/2024 1805   BUN 8 05/16/2024 1805   CREATININE 1.01 05/16/2024 1805   CALCIUM  10.0 05/16/2024 1805   GFRNONAA >60 05/16/2024 1805    IMAGING past 24 hours ECHOCARDIOGRAM COMPLETE Result Date: 05/17/2024    ECHOCARDIOGRAM REPORT   Patient Name:   [name] Date of Exam: 05/17/2024 Medical Rec #:  [id number]      Height:       67.0 in Accession #:    [id number]     Weight:       180.0 lb Date of Birth:  1961/06/16      BSA:          1.934 m Patient Age:    63 years       BP:           150/83 mmHg Patient Gender: M              HR:           48 bpm. Exam Location:  Inpatient Procedure: 2D Echo, Cardiac Doppler and Color Doppler (Both Spectral and Color            Flow Doppler were utilized during procedure). Indications:    Stroke I63.9  History:        Patient has prior history of Echocardiogram  examinations, most                 recent 01/24/2019. Stroke; Risk Factors:Current Smoker,                 Dyslipidemia and Hypertension.  Sonographer:    BERNARDA ROCKS Referring Phys: ALLISON L ELLIS IMPRESSIONS  1. Low normal to mildly reduced LV function (EF 50).  2. Left ventricular ejection fraction, by estimation, is 50 to 55%. The left ventricle has low normal function. The left ventricle has no regional wall motion abnormalities. The left ventricular internal cavity size was mildly dilated. Left ventricular diastolic parameters are consistent with Grade I diastolic dysfunction (impaired relaxation).  3. Right ventricular systolic function is normal. The right ventricular size is normal. There is normal pulmonary artery systolic pressure.  4. Left atrial size was mildly dilated.  5. The mitral valve is normal in structure. Trivial mitral valve  regurgitation. No evidence of mitral stenosis.  6. The aortic valve is tricuspid. Aortic valve regurgitation is trivial. Aortic valve sclerosis is present, with no evidence of aortic valve stenosis.  7. Aortic dilatation noted. There is mild dilatation of the ascending aorta, measuring 42 mm.  8. The inferior vena cava is normal in size with greater than 50% respiratory variability, suggesting right atrial pressure of 3 mmHg. FINDINGS  Left Ventricle: Left ventricular ejection fraction, by estimation, is 50 to 55%. The left ventricle has low normal function. The left ventricle has no regional wall motion abnormalities. The left ventricular internal cavity size was mildly dilated. There is no left ventricular hypertrophy. Left ventricular diastolic parameters are consistent with Grade I diastolic dysfunction (impaired relaxation). Right Ventricle: The right ventricular size is normal. Right ventricular systolic function is normal. There is normal pulmonary artery systolic pressure. Left Atrium: Left atrial size was mildly dilated. Right Atrium: Right atrial size was  normal in size. Pericardium: There is no evidence of pericardial effusion. Mitral Valve: The mitral valve is normal in structure. Mild mitral annular calcification. Trivial mitral valve regurgitation. No evidence of mitral valve stenosis. Tricuspid Valve: The tricuspid valve is normal in structure. Tricuspid valve regurgitation is trivial. No evidence of tricuspid stenosis. Aortic Valve: The aortic valve is tricuspid. Aortic valve regurgitation is trivial. Aortic valve sclerosis is present, with no evidence of aortic valve stenosis. Pulmonic Valve: The pulmonic valve was normal in structure. Pulmonic valve regurgitation is not visualized. No evidence of pulmonic stenosis. Aorta: Aortic dilatation noted. There is mild dilatation of the ascending aorta, measuring 42 mm. Venous: The inferior vena cava is normal in size with greater than 50% respiratory variability, suggesting right atrial pressure of 3 mmHg. IAS/Shunts: No atrial level shunt detected by color flow Doppler. Additional Comments: Low normal to mildly reduced LV function (EF 50).  LEFT VENTRICLE PLAX 2D LVIDd:         5.50 cm      Diastology LVIDs:         3.50 cm      LV e' medial:    6.09 cm/s LV PW:         0.90 cm      LV E/e' medial:  8.2 LV IVS:        0.90 cm      LV e' lateral:   7.62 cm/s LVOT diam:     2.00 cm      LV E/e' lateral: 6.5 LV SV:         55 LV SV Index:   28 LVOT Area:     3.14 cm  LV Volumes (MOD) LV vol d, MOD A2C: 184.0 ml LV vol d, MOD A4C: 200.0 ml LV vol s, MOD A2C: 82.5 ml LV vol s, MOD A4C: 78.2 ml LV SV MOD A2C:     101.5 ml LV SV MOD A4C:     200.0 ml LV SV MOD BP:      118.8 ml RIGHT VENTRICLE             IVC RV Basal diam:  3.30 cm     IVC diam: 1.90 cm RV S prime:     10.90 cm/s TAPSE (M-mode): 2.7 cm LEFT ATRIUM             Index        RIGHT ATRIUM           Index LA diam:  3.30 cm 1.71 cm/m   RA Area:     17.00 cm LA Vol (A2C):   81.2 ml 41.99 ml/m  RA Volume:   40.30 ml  20.84 ml/m LA Vol (A4C):   38.3 ml  19.81 ml/m LA Biplane Vol: 60.0 ml 31.03 ml/m  AORTIC VALVE                    PULMONIC VALVE AV Area (Vmax):    2.01 cm     PV Vmax:       0.64 m/s AV Area (Vmean):   1.78 cm     PV Peak grad:  1.6 mmHg AV Area (VTI):     1.93 cm AV Vmax:           135.00 cm/s AV Vmean:          90.600 cm/s AV VTI:            0.285 m AV Peak Grad:      7.3 mmHg AV Mean Grad:      4.0 mmHg LVOT Vmax:         86.20 cm/s LVOT Vmean:        51.200 cm/s LVOT VTI:          0.175 m LVOT/AV VTI ratio: 0.61  AORTA Ao Root diam: 3.80 cm Ao Asc diam:  4.20 cm MITRAL VALVE MV Area (PHT): 1.91 cm    SHUNTS MV Decel Time: 398 msec    Systemic VTI:  0.18 m MV E velocity: 49.70 cm/s  Systemic Diam: 2.00 cm MV A velocity: 73.40 cm/s MV E/A ratio:  0.68 Redell Shallow MD Electronically signed by Redell Shallow MD Signature Date/Time: 05/17/2024/4:05:49 PM    Final    CT ANGIO HEAD NECK W WO CM Result Date: 05/17/2024 CLINICAL DATA:  Stroke/TIA, determine embolic source. EXAM: CT ANGIOGRAPHY HEAD AND NECK WITH AND WITHOUT CONTRAST TECHNIQUE: Multidetector CT imaging of the head and neck was performed using the standard protocol during bolus administration of intravenous contrast. Multiplanar CT image reconstructions and MIPs were obtained to evaluate the vascular anatomy. Carotid stenosis measurements (when applicable) are obtained utilizing NASCET criteria, using the distal internal carotid diameter as the denominator. RADIATION DOSE REDUCTION: This exam was performed according to the departmental dose-optimization program which includes automated exposure control, adjustment of the mA and/or kV according to patient size and/or use of iterative reconstruction technique. CONTRAST:  75mL OMNIPAQUE  IOHEXOL  350 MG/ML SOLN COMPARISON:  Head CT 05/16/2024 and MRI 05/17/2024. Head MRA 08/05/2012. FINDINGS: CT HEAD FINDINGS Brain: As shown on today's MRI, there is an acute infarct involving the left basal ganglia and corona radiata and a more subacute  appearing infarct laterally in the posterior right temporal lobe. There is a background of advanced chronic small vessel ischemia involving the cerebral white matter and deep gray nuclei as well as brainstem, and there are chronic left larger than right cerebellar infarcts. There is mild cerebral atrophy. No acute intracranial hemorrhage, mass, midline shift, or extra-axial fluid collection is identified. Vascular: Calcified atherosclerosis at the skull base. Skull: No fracture or suspicious lesion. Sinuses/Orbits: Paranasal sinuses and mastoid air cells are clear. Unremarkable orbits. Other: None. Review of the MIP images confirms the above findings CTA NECK FINDINGS Aortic arch: Normal variant aortic arch branching pattern with common origin of the brachiocephalic and left common carotid arteries. Mild atherosclerosis. No significant stenosis of the brachiocephalic or subclavian arteries. Right carotid system: Patent with moderate, predominantly calcified atherosclerosis throughout  the common carotid artery and about the carotid bifurcation without evidence of a significant stenosis or dissection. Left carotid system: Patent with mild to moderate, predominantly calcified plaque in the mid common carotid artery and about the carotid bifurcation without evidence of a significant stenosis or dissection. Tortuous mid cervical ICA. Vertebral arteries: Patent with the right being mildly dominant. Mild diffuse atherosclerotic irregularity bilaterally and calcified atherosclerosis at both vertebral artery origins without evidence of a flow limiting stenosis. Skeleton: Mild cervical spondylosis. Other neck: No evidence of cervical lymphadenopathy or mass. Upper chest: Mild emphysema. Review of the MIP images confirms the above findings CTA HEAD FINDINGS Anterior circulation: The internal carotid arteries are patent from skull base to carotid termini with mild atherosclerosis bilaterally not resulting in a significant  stenosis. ACAs and MCAs are patent with branch vessel atherosclerosis but no evidence of a proximal branch occlusion or significant A1 or M1 stenosis. There is occlusion of a right M3 branch in the posterior right temporal region corresponding to the subacute infarct on MRI. No aneurysm is identified. Posterior circulation: The intracranial vertebral arteries are widely patent to the basilar. The right PICA appears patent proximally. A left PICA is again not visualized and may be chronically occluded. The basilar artery is patent with a new mild stenosis proximally. There is a new severe stenosis of the proximal left SCA. There are small right and large left posterior communicating arteries, likely with a severe stenosis of the distal right posterior communicating artery. There is also a severe stenosis of the distal left P1 segment. No aneurysm is identified. Venous sinuses: As permitted by contrast timing, patent. Anatomic variants: None of significance. Review of the MIP images confirms the above findings IMPRESSION: 1. Occlusion of a right M3 branch corresponding to the known subacute infarct in the posterior right temporal lobe. 2. Intracranial atherosclerosis including severe stenoses of the proximal left SCA, right posterior communicating artery, and left P1 segment and a mild stenosis of the basilar artery. 3. Cervical carotid atherosclerosis without significant stenosis. 4. Aortic Atherosclerosis (ICD10-I70.0) and Emphysema (ICD10-J43.9). Electronically Signed   By: Dasie Hamburg M.D.   On: 05/17/2024 13:25   MR BRAIN W WO CONTRAST Result Date: 05/17/2024 CLINICAL DATA:  63 year old male with neurologic deficit. Evidence of cerebral small vessel disease by CT. EXAM: MRI HEAD WITHOUT AND WITH CONTRAST TECHNIQUE: Multiplanar, multiecho pulse sequences of the brain and surrounding structures were obtained without and with intravenous contrast. CONTRAST:  8mL GADAVIST  GADOBUTROL  1 MMOL/ML IV SOLN COMPARISON:   Head CT yesterday.  Brain MRI 04/16/2017. FINDINGS: Brain: Advanced chronic small vessel disease. Superimposed confluent restricted diffusion tracking from the left corona radiata into the lentiform, slightly greater than 2 cm size (series 3, image 22). T2 and FLAIR hyperintensity there. No hemorrhage or mass effect. No enhancement. Additional small area of left middle frontal gyrus cortical and subcortical white matter diffusion restriction (series 2, image 40 and series 3, image 24) in an area 8-9 mm. T2 and FLAIR hyperintensity. No hemorrhage or mass effect. No enhancement. Patchy superimposed restricted diffusion also in the posterior right temporal lobe, junction with the anterolateral right occipital lobe in an area of about 2.5 cm. Similar patchy, confluent T2 and FLAIR hyperintensity. No hemorrhage or mass effect. But this area does demonstrate patchy and gyriform post ischemic enhancement (coronal images 13 and 14). Sizable chronic infarct in the left cerebellum PICA territory is chronic and was present in 2018. Progressed since that time patchy and confluent chronic infarcts in  the contralateral right PICA territory, lacunar infarcts throughout bilateral central brainstem (series 5, image 10), bilateral basal ganglia, and in the central thalami (series 8, image 56). Limited chronic microhemorrhage on SWI, in the left thalamus. Progressed Patchy and confluent bilateral cerebral white matter T2 and FLAIR hyperintensity, now widespread. Small area of chronic cortical encephalomalacia anterior left inferior frontal gyrus on series 9, image 29 was present in 2018. And no other significant cerebral cortical encephalomalacia. No other abnormal intracranial enhancement. No dural thickening. No midline shift, mass effect, evidence of mass lesion, ventriculomegaly, extra-axial collection or acute intracranial hemorrhage. Cervicomedullary junction and pituitary are within normal limits. Vascular: Major intracranial  vascular flow voids are preserved, stable since 2018. Chronic intracranial artery tortuosity. Following contrast major dural venous sinuses are enhancing and appear to be patent. Skull and upper cervical spine: Negative. Visualized bone marrow signal is within normal limits. Sinuses/Orbits: Negative orbits. Paranasal Visualized paranasal sinuses and mastoids are stable and well aerated. Other: Visible internal auditory structures appear normal. Negative visible scalp and face. IMPRESSION: 1. Advanced chronic small and medium-sized vessel ischemia with substantial progression since a 2018 MRI. 2. Superimposed Acute Ischemia in the Left MCA territory: - confluent lacunar infarct left corona radiata and lentiform. - small cortically based infarct left inferior frontal gyrus AND additional more subacute appearing cortically based infarct at the Right posterior temporal lobe. No associated hemorrhage or mass effect at any location. Mild post ischemic enhancement of the temporal lobe infarct. Electronically Signed   By: VEAR Hurst M.D.   On: 05/17/2024 09:38    Vitals:   05/17/24 1955 05/18/24 0016 05/18/24 0402 05/18/24 0746  BP: 136/80 (!) 147/91 (!) 152/84 (!) 145/88  Pulse: (!) 59 67 73 78  Resp: 19 19 19 14   Temp: 98.4 F (36.9 C) 98.6 F (37 C) 98.4 F (36.9 C) 99.6 F (37.6 C)  TempSrc: Oral Oral  Oral  SpO2: 92% 93% (!) 88% 95%     PHYSICAL EXAM General:  Alert, well-nourished, well-developed patient in no acute distress Psych:  Mood and affect appropriate for situation CV: Regular rate and rhythm on monitor Respiratory:  Regular, unlabored respirations on room air GI: Abdomen soft and nontender   NEURO:  Mental Status: AA&Ox3, patient is able to give clear and coherent history Speech/Language: speech is without dysarthria or aphasia.  Naming, repetition, fluency, and comprehension intact.  Has some decreased fluency of speech  Cranial Nerves:  II: PERRL. Visual fields full.  III, IV,  VI: EOMI. Eyelids elevate symmetrically.  V: Sensation is intact to light touch and symmetrical to face.  VII: Face is symmetrical resting and smiling VIII: hearing intact to voice. IX, X: Palate elevates symmetrically. Phonation is normal.  KP:Dynloizm shrug 5/5. XII: tongue is midline without fasciculations. Motor: 5/5 strength to all muscle groups tested.  Tone: is normal and bulk is normal Sensation- Intact to light touch bilaterally. Extinction absent to light touch to DSS.   Coordination: FTN intact bilaterally, HKS: no ataxia in BLE.No drift.  Diminished fine motor skills on left, with slight right over left orbiting Gait- deferred  Most Recent NIH  1a Level of Conscious.:  1b LOC Questions:  1c LOC Commands:  2 Best Gaze:  3 Visual:  4 Facial Palsy:  5a Motor Arm - left:  5b Motor Arm - Right:  6a Motor Leg - Left:  6b Motor Leg - Right:  7 Limb Ataxia:  8 Sensory:  9 Best Language: 1 10 Dysarthria:  11  Extinct. and Inatten.:  TOTAL: 1   ASSESSMENT/PLAN  [name] is a 63 y.o. male with history of EtOH abuse, cocaine abuse, HLD, HTN, stroke in 2013 without residual deficit, and hepatitis B who presented to [facility] 9/5 for evaluation of dysarthria and aphasia since Monday, 05/12/2024 when he woke from sleeping  NIH on Admission 2  Acute Ischemic Infarct:  bilateral lacunar infarct left corona radiata and lentiform and Right posterior temporal lobe Etiology: Small vessel disease and intracranial atherosclerosis uncontrolled risk factors CT head No acute abnormality. Small vessel disease. Atrophy.  Chronic cerebellar infarcts CTA head & neck Occlusion of a right M3. Intracranial atherosclerosis including severe stenoses of the proximal left SCA, right posterior communicating artery, and left P1 segment and a mild stenosis of the basilar artery. MRI  Acute Ischemia lacunar infarct left corona radiata and lentiform and Right posterior temporal lobe. Advanced  chronic small and medium-sized vessel ischemia with  EEG There was no seizure or seizure predisposition recorded on this study. Please note that lack of epileptiform activity on EEG does not preclude the possibility of epilepsy.  2D Echo EF 50 to 55%.  LV mildly dilated with grade 1 diastolic dysfunction.  Left atrium mildly dilated LDL 95 HgbA1c 5.6 VTE prophylaxis -Lovenox  aspirin  81 mg daily prior to admission, now on aspirin  81 mg daily and clopidogrel  75 mg daily for 3 weeks and then Plavix  alone. Therapy recommendations:  Pending Disposition: Pending  Hx of Stroke/TIA Chronic cerebellar infarcts  Hypertension Home meds: None, labetalol  200 mg as noted on med rec states he is not taking, amlodipine  10 mg, losartan  25 mg Stable Blood Pressure Goal: SBP less than 160   Hyperlipidemia Home meds: None LDL 95, goal < 70 Add Crestor  20 mg Continue statin at discharge  Tobacco Abuse Patient smokes half a pack per day       Ready to quit? No Nicotine  replacement therapy provided  Substance Abuse Alcohol abuse Patient with history of cocaine and THC use ETOH use, alcohol level <15, advised to drink no more than 1 drink(s) a day UDS positive for  [facility]       Ready to quit? No TOC consult for cessation placed  Dysphagia Patient has post-stroke dysphagia, SLP consulted    Diet   Diet Heart Room service appropriate? Yes; Fluid consistency: Thin   Advance diet as tolerated  Other Stroke Risk Factors Obesity,  BMI >/= 30 associated with increased stroke risk, recommend weight loss, diet and exercise as appropriate Congestive heart failure   Other Active Problems   Hospital day # 0  Karna Geralds DNP, ACNPC-AG  [facility]  I have personally obtained history,examined this patient, reviewed notes, independently viewed imaging studies, participated in medical decision making and plan of care.ROS completed by me personally and pertinent positives fully documented   I have made any additions or clarifications directly to the above note. Agree with note above.  Patient with prior history of stroke was noncompliant with medications presents with subacute speech difficulties with MRI scan showing acute left subcortical lacunar as well as subacute right temporal infarct CT angiogram brain reveals multivessel chronic changes.  Recommend aspirin  and Plavix  for 3 weeks followed by Plavix  alone and aggressive risk factor modification.  Continue ongoing stroke workup. Patient also appears to be at risk for sleep apnea and may benefit with consideration for possible participation in the sleep smart study.  I also spoke with the patient's wife over the phone.  I have given patient written information to review about the study to decide if he wants to participate.  It was made quite clear study participation is voluntary and patient can be dropped on send any point in the future if not fully satisfied.  Patient will be get the same excellent medical care irrespective of whether he is participating with the study or not..   I personally spent a total of 50 minutes in the care of the patient today including getting/reviewing separately obtained history, performing a medically appropriate exam/evaluation, counseling and educating, placing orders, referring and communicating with other health care professionals, documenting clinical information in the EHR, independently interpreting results, and coordinating care.        Eather Popp, MD Medical Director [facility] Pager: [phone] 05/18/2024 1:29 PM    To contact Stroke Continuity provider, please refer to [email or web address]. After hours, contact General Neurology

## 2024-05-18 NOTE — Evaluation (Signed)
 Occupational Therapy Evaluation Patient Details Name: [name] MRN: [id number] DOB: 1961/06/22 Today's Date: 05/18/2024   History of Present Illness   63 y.o. male [facility] with confusion and expressive aphasia. MRI brain showed acute ischemia in L MCA and subacute cortical-based infarct in R temporal lobe. CTA showed R M3 occlusion. UDS + THC. PMHx: CVA, HTN, alcohol and cocaine use     Clinical Impressions This 63 yo male admitted with above presents to acute OT with PLOF of being independent with basic ADLs and IADLs but not driving. He currently is setup-CGA for basic ADLs due to decreased safety awareness and seemingly decreased visual perceptual issues in upper visual fields. He will continue to benefit from acute OT with follow up OPPT (neuro)     If plan is discharge home, recommend the following:   A little help with walking and/or transfers;A little help with bathing/dressing/bathroom;Assistance with cooking/housework;Assist for transportation;Help with stairs or ramp for entrance;Direct supervision/assist for financial management;Direct supervision/assist for medications management     Functional Status Assessment   Patient has had a recent decline in their functional status and demonstrates the ability to make significant improvements in function in a reasonable and predictable amount of time.     Equipment Recommendations   None recommended by OT      Precautions/Restrictions   Precautions Precautions: Fall Recall of Precautions/Restrictions: Impaired Restrictions Weight Bearing Restrictions Per Provider Order: No     Mobility Bed Mobility Overal bed mobility: Modified Independent                  Transfers Overall transfer level: Needs assistance Equipment used: None Transfers: Sit to/from Stand Sit to Stand: Contact guard assist           General transfer comment: for safety      Balance Overall balance  assessment: Needs assistance, History of Falls Sitting-balance support: No upper extremity supported, Feet supported Sitting balance-Leahy Scale: Good     Standing balance support: No upper extremity supported, During functional activity Standing balance-Leahy Scale: Fair Standing balance comment: standing at sink to wash his face                           ADL either performed or assessed with clinical judgement   ADL Overall ADL's : Needs assistance/impaired Eating/Feeding: Independent;Sitting Eating/Feeding Details (indicate cue type and reason): EOB Grooming: Wash/dry face;Contact guard assist;Standing   Upper Body Bathing: Set up;Sitting Upper Body Bathing Details (indicate cue type and reason): EOB Lower Body Bathing: Contact guard assist;Sit to/from stand   Upper Body Dressing : Set up;Sitting Upper Body Dressing Details (indicate cue type and reason): EOB Lower Body Dressing: Contact guard assist;Sit to/from stand   Toilet Transfer: Minimal assistance;Ambulation Toilet Transfer Details (indicate cue type and reason): no AD; simulated bed>walking in hallway>back to room to stand at sink to wash face Toileting- Clothing Manipulation and Hygiene: Contact guard assist;Sit to/from stand               Vision Baseline Vision/History: 1 Wears glasses Ability to See in Adequate Light: 0 Adequate Patient Visual Report: No change from baseline Vision Assessment?: Yes Eye Alignment: Within Functional Limits Ocular Range of Motion: Within Functional Limits Alignment/Gaze Preference: Within Defined Limits Tracking/Visual Pursuits: Able to track stimulus in all quads without difficulty Saccades:  (did not check due does not drive) Convergence: Within functional limits Visual Fields: No apparent deficits Additional Comments: When ambulating  down the hallway and asked to find the red lighted exit signs (needed to look up and to the right) he could not find them without  directional cues. Once he found them then I asked him to go to the 2nd one and then turn around. As he approached the 2nd one he pointed at it and so I said what are you supposed to do and he said go left--had to be corrected). Once in room I handed him a tri-fold brochure and asked him to find the words NIGHT ONE (which was in bold letters as a heading in the middle section of the trifold and in the middle of this section (he could not find it). When asked if he could read the paragraph below those words, he did it without any problem. Then I asked him to find the words NIGHT TWO (on 3rd section of tri-fold at top of this section) and he could not find it without cues nor could he reason through where those words should based of where the words NIGHT ONE were.            Pertinent Vitals/Pain Pain Assessment Pain Assessment: No/denies pain     Extremity/Trunk Assessment Upper Extremity Assessment Upper Extremity Assessment: Overall WFL for tasks assessed           Communication Communication Communication: Impaired Factors Affecting Communication: Difficulty expressing self   Cognition Arousal: Alert Behavior During Therapy: Flat affect Cognition: Cognition impaired     Awareness: Intellectual awareness impaired   Attention impairment (select first level of impairment): Sustained attention Executive functioning impairment (select all impairments): Reasoning                   Following commands: Impaired Following commands impaired: Only follows one step commands consistently     Cueing    Cueing Techniques: Verbal cues              Home Living Family/patient expects to be discharged to:: Private residence Living Arrangements: Spouse/significant other Available Help at Discharge: Family;Available 24 hours/day (wife works but stays at home) Type of Home: House Home Access: Level entry     Home Layout: One level     Bathroom Shower/Tub: Scientist, research (life sciences): Standard     Home Equipment: Information systems manager      Lives With: Family    Prior Functioning/Environment Prior Level of Function : Independent/Modified Independent;History of Falls (last six months)             Mobility Comments: x1 fall due to tripping, able to get up unassisted ADLs Comments: wife drives, wife does cooking/cleaning    OT Problem List: Impaired balance (sitting and/or standing);Impaired vision/perception   OT Treatment/Interventions: Self-care/ADL training;DME and/or AE instruction;Balance training;Patient/family education;Visual/perceptual remediation/compensation      OT Goals(Current goals can be found in the care plan section)   Acute Rehab OT Goals Patient Stated Goal: did not state OT Goal Formulation: With patient Time For Goal Achievement: 06/01/24 Potential to Achieve Goals: Good   OT Frequency:  Min 2X/week       AM-PAC OT 6 Clicks Daily Activity     Outcome Measure Help from another person eating meals?: None Help from another person taking care of personal grooming?: A Little Help from another person toileting, which includes using toliet, bedpan, or urinal?: A Little Help from another person bathing (including washing, rinsing, drying)?: A Little Help from another person to put on and taking off regular upper  body clothing?: A Little Help from another person to put on and taking off regular lower body clothing?: A Little 6 Click Score: 19   End of Session Equipment Utilized During Treatment: Gait belt  Activity Tolerance: Patient tolerated treatment well Patient left: in bed;with call bell/phone within reach;with bed alarm set;with family/visitor present  OT Visit Diagnosis: Unsteadiness on feet (R26.81);Cognitive communication deficit (R41.841);Other symptoms and signs involving cognitive function;Low vision, both eyes (H54.2) Symptoms and signs involving cognitive functions: Cerebral infarction                Time:  1453-1520 OT Time Calculation (min): 27 min Charges:  OT General Charges $OT Visit: 1 Visit OT Evaluation $OT Eval Moderate Complexity: 1 Mod OT Treatments $Self Care/Home Management : 8-22 mins  Donny BECKER OT Acute Rehabilitation Services Office [phone]    Rodgers Dorothyann Distel 05/18/2024, 3:52 PM

## 2024-05-18 NOTE — Evaluation (Signed)
 Physical Therapy Evaluation Patient Details Name: [name] MRN: [id number] DOB: [birth year] Today's Date: 05/18/2024  History of Present Illness  63 y.o. male presents to [facility] 05/16/24 with confusion and expressive aphasia. MRI brain showed acute ischemia in L MCA and subacute cortical-based infarct in R temporal lobe. CTA showed R M3 occlusion. UDS + THC. PMHx: CVA, HTN, alcohol and cocaine use   Clinical Impression  Pt in bed upon arrival and agreeable to PT eval. PTA, pt was independent for mobility with no AD. Pt presents with impaired cognition, slowed speech, and impaired dynamic balance/gait pattern. In today's session, pt was ModI for bed mobility and CGA to ambulate with no AD with no challenges to balance. Pt required MinA with dynamic balance testing. Pt scored a 16/24 on the DGI indicating that pt is at an increased risk of falling. Pt would benefit from neuro OP PT to address balance deficits and work on independence with mobility. Pt will have 24/7 assist from wife upon d/c home. Pt would benefit from acute skilled PT with current functional limitations listed below (see PT Problem List). Acute PT to follow.         If plan is discharge home, recommend the following: A little help with walking and/or transfers;A little help with bathing/dressing/bathroom;Assist for transportation;Help with stairs or ramp for entrance   Can travel by private vehicle    Yes    Equipment Recommendations None recommended by PT     Functional Status Assessment Patient has had a recent decline in their functional status and demonstrates the ability to make significant improvements in function in a reasonable and predictable amount of time.     Precautions / Restrictions Precautions Precautions: Fall Recall of Precautions/Restrictions: Impaired Restrictions Weight Bearing Restrictions Per Provider Order: No      Mobility  Bed Mobility Overal bed mobility: Modified Independent      Transfers Overall transfer level: Needs assistance Equipment used: None Transfers: Sit to/from Stand Sit to Stand: Contact guard assist      General transfer comment: for safety    Ambulation/Gait Ambulation/Gait assistance: Contact guard assist, Min assist Gait Distance (Feet): 400 Feet Assistive device: None Gait Pattern/deviations: Step-through pattern, Staggering left, Staggering right, Decreased stride length Gait velocity: decr     General Gait Details: difficulty changing gait speed with preferrence for slow gait speed, steady with no challenges to balance. MinA with horizontal and vertical head turns  Stairs Stairs: Yes Stairs assistance: Contact guard assist Stair Management: One rail Right, One rail Left, Step to pattern Number of Stairs: 2 General stair comments: steady with use of bilateral handrails  Modified Rankin (Stroke Patients Only) Modified Rankin (Stroke Patients Only) Pre-Morbid Rankin Score: No symptoms Modified Rankin: Moderately severe disability     Balance Overall balance assessment: Needs assistance, History of Falls Sitting-balance support: No upper extremity supported, Feet supported Sitting balance-Leahy Scale: Good     Standing balance support: No upper extremity supported Standing balance-Leahy Scale: Fair    Standardized Balance Assessment Standardized Balance Assessment : Dynamic Gait Index   Dynamic Gait Index Level Surface: Mild Impairment Change in Gait Speed: Moderate Impairment Gait with Horizontal Head Turns: Moderate Impairment Gait with Vertical Head Turns: Mild Impairment Gait and Pivot Turn: Normal Step Over Obstacle: Normal Step Around Obstacles: Normal Steps: Moderate Impairment Total Score: 16       Pertinent Vitals/Pain Pain Assessment Pain Assessment: No/denies pain    Home Living Family/patient expects to be discharged to:: Private residence Living  Arrangements: Spouse/significant other Available  Help at Discharge: Family;Available 24 hours/day (works, but can stay home) Type of Home: House Home Access: Level entry       Home Layout: One level Home Equipment: Shower seat      Prior Function Prior Level of Function : Independent/Modified Independent;History of Falls (last six months)      Mobility Comments: x1 fall due to tripping, able to get up unassisted ADLs Comments: wife drives, wife does cooking/cleaning     Extremity/Trunk Assessment   Upper Extremity Assessment Upper Extremity Assessment: Defer to OT evaluation    Lower Extremity Assessment Lower Extremity Assessment: Overall WFL for tasks assessed (WFL coordination, strength, sensation)    Cervical / Trunk Assessment Cervical / Trunk Assessment: Normal  Communication   Communication Communication: Impaired Factors Affecting Communication: Difficulty expressing self    Cognition Arousal: Alert Behavior During Therapy: Flat affect   PT - Cognitive impairments: Orientation, Memory, Sequencing, Problem solving, Safety/Judgement   Orientation impairments: Time    PT - Cognition Comments: difficulty with STM, would often repeat back phrases. Slightly slowed speech Following commands: Impaired Following commands impaired: Only follows one step commands consistently, Follows multi-step commands inconsistently     Cueing Cueing Techniques: Verbal cues      PT Assessment Patient needs continued PT services  PT Problem List Decreased balance;Decreased mobility;Decreased cognition;Decreased safety awareness       PT Treatment Interventions DME instruction;Gait training;Stair training;Functional mobility training;Therapeutic activities;Balance training;Therapeutic exercise;Neuromuscular re-education;Patient/family education    PT Goals (Current goals can be found in the Care Plan section)  Acute Rehab PT Goals Patient Stated Goal: to get better PT Goal Formulation: With patient Time For Goal  Achievement: 06/01/24 Potential to Achieve Goals: Good    Frequency Min 2X/week        AM-PAC PT 6 Clicks Mobility  Outcome Measure Help needed turning from your back to your side while in a flat bed without using bedrails?: None Help needed moving from lying on your back to sitting on the side of a flat bed without using bedrails?: None Help needed moving to and from a bed to a chair (including a wheelchair)?: A Little Help needed standing up from a chair using your arms (e.g., wheelchair or bedside chair)?: A Little Help needed to walk in hospital room?: A Little Help needed climbing 3-5 steps with a railing? : A Little 6 Click Score: 20    End of Session Equipment Utilized During Treatment: Gait belt Activity Tolerance: Patient tolerated treatment well Patient left: in bed;with call bell/phone within reach;with bed alarm set Nurse Communication: Mobility status PT Visit Diagnosis: Other abnormalities of gait and mobility (R26.89);Unsteadiness on feet (R26.81)    Time: 8964-8947 PT Time Calculation (min) (ACUTE ONLY): 17 min   Charges:   PT Evaluation $PT Eval Low Complexity: 1 Low   PT General Charges $$ ACUTE PT VISIT: 1 Visit        Kate ORN, PT, DPT Secure Chat Preferred  Rehab Office [phone]   Kate BRAVO Wendolyn 05/18/2024, 11:00 AM

## 2024-05-18 NOTE — Plan of Care (Signed)
  Problem: Health Behavior/Discharge Planning: Goal: Ability to manage health-related needs will improve Outcome: Progressing   Problem: Clinical Measurements: Goal: Will remain free from infection Outcome: Progressing   Problem: Activity: Goal: Risk for activity intolerance will decrease Outcome: Progressing   Problem: Coping: Goal: Level of anxiety will decrease Outcome: Progressing   Problem: Pain Managment: Goal: General experience of comfort will improve and/or be controlled Outcome: Progressing   Problem: Safety: Goal: Ability to remain free from injury will improve Outcome: Progressing   Problem: Ischemic Stroke/TIA Tissue Perfusion: Goal: Complications of ischemic stroke/TIA will be minimized Outcome: Progressing   Problem: Self-Care: Goal: Ability to participate in self-care as condition permits will improve Outcome: Progressing

## 2024-05-19 ENCOUNTER — Encounter (HOSPITAL_COMMUNITY): Payer: Self-pay | Admitting: Internal Medicine

## 2024-05-19 DIAGNOSIS — R471 Dysarthria and anarthria: Secondary | ICD-10-CM | POA: Diagnosis not present

## 2024-05-19 DIAGNOSIS — I739 Peripheral vascular disease, unspecified: Secondary | ICD-10-CM | POA: Diagnosis not present

## 2024-05-19 DIAGNOSIS — R29701 NIHSS score 1: Secondary | ICD-10-CM | POA: Diagnosis not present

## 2024-05-19 DIAGNOSIS — I6381 Other cerebral infarction due to occlusion or stenosis of small artery: Secondary | ICD-10-CM | POA: Diagnosis not present

## 2024-05-19 DIAGNOSIS — I6350 Cerebral infarction due to unspecified occlusion or stenosis of unspecified cerebral artery: Secondary | ICD-10-CM | POA: Diagnosis not present

## 2024-05-19 NOTE — Progress Notes (Addendum)
 STROKE TEAM PROGRESS NOTE   INTERIM HISTORY/SUBJECTIVE Family at he bedside. No new neurological events overnight.  Neurological exam remains stable and unchanged.  Patient did sign consent to participate in the sleep smart study and tested positive in the overnight marks to monitor and will get CPAP tolerability test tonight.   CBC    Component Value Date/Time   WBC 9.0 05/16/2024 1805   RBC 4.56 05/16/2024 1805   HGB 14.1 05/16/2024 1805   HCT 40.7 05/16/2024 1805   PLT 248 05/16/2024 1805   MCV 89.3 05/16/2024 1805   MCH 30.9 05/16/2024 1805   MCHC 34.6 05/16/2024 1805   RDW 13.8 05/16/2024 1805   LYMPHSABS 2.9 05/16/2024 1805   MONOABS 0.5 05/16/2024 1805   EOSABS 0.0 05/16/2024 1805   BASOSABS 0.0 05/16/2024 1805    BMET    Component Value Date/Time   NA 136 05/16/2024 1805   K 3.8 05/16/2024 1805   CL 101 05/16/2024 1805   CO2 22 05/16/2024 1805   GLUCOSE 97 05/16/2024 1805   BUN 8 05/16/2024 1805   CREATININE 1.01 05/16/2024 1805   CALCIUM  10.0 05/16/2024 1805   GFRNONAA >60 05/16/2024 1805    IMAGING past 24 hours No results found.   Vitals:   05/18/24 2341 05/19/24 0333 05/19/24 0907 05/19/24 1019  BP: 138/76 (!) 150/84 (!) 154/76   Pulse: 62 60 63   Resp: 18 20    Temp: 99.8 F (37.7 C) 99 F (37.2 C) 99.7 F (37.6 C)   TempSrc: Oral Oral Oral   SpO2: 94% 94% 100%   Weight:    86.2 kg  Height:    5' 7 (1.702 m)     PHYSICAL EXAM General:  Alert, well-nourished, well-developed patient in no acute distress Psych:  Mood and affect appropriate for situation CV: Regular rate and rhythm on monitor Respiratory:  Regular, unlabored respirations on room air GI: Abdomen soft and nontender   NEURO:  Mental Status: AA&Ox3, patient is able to give clear and coherent history Speech/Language: speech is without dysarthria or aphasia.  Naming, repetition, fluency, and comprehension intact.  Has some decreased fluency of speech  Cranial Nerves:  II:  PERRL. Visual fields full.  III, IV, VI: EOMI. Eyelids elevate symmetrically.  V: Sensation is intact to light touch and symmetrical to face.  VII: Face is symmetrical resting and smiling VIII: hearing intact to voice. IX, X: Palate elevates symmetrically. Phonation is normal.  KP:Dynloizm shrug 5/5. XII: tongue is midline without fasciculations. Motor: 5/5 strength to all muscle groups tested.  Tone: is normal and bulk is normal Sensation- Intact to light touch bilaterally. Extinction absent to light touch to DSS.   Coordination: FTN intact bilaterally, HKS: no ataxia in BLE.No drift.  Diminished fine motor skills on left, with slight right over left orbiting Gait- deferred  Most Recent NIH  1a Level of Conscious.:  1b LOC Questions:  1c LOC Commands:  2 Best Gaze:  3 Visual:  4 Facial Palsy:  5a Motor Arm - left:  5b Motor Arm - Right:  6a Motor Leg - Left:  6b Motor Leg - Right:  7 Limb Ataxia:  8 Sensory:  9 Best Language: 1 10 Dysarthria:  11 Extinct. and Inatten.:  TOTAL: 1   ASSESSMENT/PLAN  [name] is a 63 y.o. male with history of EtOH abuse, cocaine abuse, HLD, HTN, stroke in 2013 without residual deficit, and hepatitis B who presented to [facility] 9/5 for evaluation  of dysarthria and aphasia since Monday, 05/12/2024 when he woke from sleeping  NIH on Admission 2  Acute Ischemic Infarct:  bilateral lacunar infarct left corona radiata and lentiform and Right posterior temporal lobe Etiology: Small vessel disease and intracranial atherosclerosis uncontrolled risk factors CT head No acute abnormality. Small vessel disease. Atrophy.  Chronic cerebellar infarcts CTA head & neck Occlusion of a right M3. Intracranial atherosclerosis including severe stenoses of the proximal left SCA, right posterior communicating artery, and left P1 segment and a mild stenosis of the basilar artery. MRI  Acute Ischemia lacunar infarct left corona radiata and lentiform and  Right posterior temporal lobe. Advanced chronic small and medium-sized vessel ischemia with  EEG There was no seizure or seizure predisposition recorded on this study. Please note that lack of epileptiform activity on EEG does not preclude the possibility of epilepsy.  2D Echo EF 50 to 55%.  LV mildly dilated with grade 1 diastolic dysfunction.  Left atrium mildly dilated LDL 95 HgbA1c 5.6 VTE prophylaxis -Lovenox  aspirin  81 mg daily prior to admission, now on aspirin  81 mg daily and clopidogrel  75 mg daily for 3 weeks and then Plavix  alone. Therapy recommendations:  Pending Disposition: Pending  Hx of Stroke/TIA Chronic cerebellar infarcts  Hypertension Home meds: None, labetalol  200 mg as noted on med rec states he is not taking, amlodipine  10 mg, losartan  25 mg Stable Blood Pressure Goal: SBP less than 160   Hyperlipidemia Home meds: None LDL 95, goal < 70 Add Crestor  20 mg Continue statin at discharge  Tobacco Abuse Patient smokes half a pack per day       Ready to quit? No Nicotine  replacement therapy provided  Substance Abuse Alcohol abuse Patient with history of cocaine and THC use ETOH use, alcohol level <15, advised to drink no more than 1 drink(s) a day UDS positive for  THC       Ready to quit? No TOC consult for cessation placed  Dysphagia Patient has post-stroke dysphagia, SLP consulted    Diet   Diet Heart Room service appropriate? Yes; Fluid consistency: Thin   Advance diet as tolerated  Other Stroke Risk Factors Obesity,  BMI >/= 30 associated with increased stroke risk, recommend weight loss, diet and exercise as appropriate Congestive heart failure   Other Active Problems BPH   Hospital day # 0  Karna Geralds DNP, ACNPC-AG  [facility]  I have personally obtained history,examined this patient, reviewed notes, independently viewed imaging studies, participated in medical decision making and plan of care.ROS completed by me  personally and pertinent positives fully documented  I have made any additions or clarifications directly to the above note. Agree with note above.  Patient is participating in sleep study and tested positive for sleep apnea on overnight Moxie monitor.  He will get CPAP mask pulmonary test tonight.  Long discussion with patient and wife at the bedside and answered questions.   I personally spent a total of 35 minutes in the care of the patient today including getting/reviewing separately obtained history, performing a medically appropriate exam/evaluation, counseling and educating, placing orders, referring and communicating with other health care professionals, documenting clinical information in the EHR, independently interpreting results, and coordinating care.        Eather Popp, MD Medical Director [facility] Pager: [phone] 05/19/2024 1:02 PM    To contact Stroke Continuity provider, please refer to [email or web address]. After hours, contact General Neurology

## 2024-05-19 NOTE — Discharge Instructions (Signed)
 Dear [name],   Congratulations for your interest in quitting smoking!  Find a program that suits you best: when you want to quit, how you need support, where you live, and how you like to learn.    If you're ready to get started TODAY, consider scheduling a visit through [email or web address].  Appointments are available from 8am to 8pm, Monday to Friday.   Most health insurance plans will cover some level of tobacco cessation visits and medications.    Additional Resources: [employer] are also available to help you quit & provide the support you'll need. Many programs are available in both [place] and Spanish and have a long history of successfully helping people get off and stay off tobacco.    Quit Smoking Apps:  quitSTART at SeriousBroker.de QuitGuide?at ForgetParking.dk Online education and resources: Smokefree  at [employer].gov Free Telephone Coaching: QuitNow,  Call 1-800-QUIT-NOW ((514) 515-8095) or Text- Ready to 630-783-8943 *Quitline [place] has teamed up with Medicaid to offer a free 14 week program    Vaping- Want to Quit? Free 24/7 support. Call [facility]  [place], [place], [place], [place], [place]  [facility]

## 2024-05-19 NOTE — TOC Initial Note (Signed)
 Transition of Care [facility]) - Initial/Assessment Note    Patient Details  Name: [name] MRN: [id number] Date of Birth: May 22, 1961  Transition of [facility]) CM/SW Contact:    Andrez JULIANNA George, RN Phone Number: 05/19/2024, 11:47 AM  Clinical Narrative:                 [name] is a 63 y.o. male with medical history significant of prior CVA, hypertension, remote history of alcohol and cocaine use.  Presented to the [facility] on [facility]/5 via private vehicle with family reporting increased confusion and expressive aphasia for 2 days.   Pt is from home with his spouse who works rotating shifts days/ evenings.  Pt has been noncompliant with medications at home.  Spouse provides needed transportation.  Pt with obvious confusion during visit. Answering questions inappropriately.   Current recommendations are for outpatient therapy. Wife prefers [facility]. CM will sent the referral.  Wife also interested in [facility] through his medicaid. CM will send in forms to his [facility].   IP Care management following.  Expected Discharge Plan: OP Rehab Barriers to Discharge: Continued Medical Work up   Patient Goals and CMS Choice   CMS Medicare.gov Compare Post Acute Care list provided to:: Patient Represenative (must comment) Choice offered to / list presented to : Spouse      Expected Discharge Plan and Services   Discharge Planning Services: CM Consult   Living arrangements for the past 2 months: Single Family Home                                      Prior Living Arrangements/Services Living arrangements for the past 2 months: Single Family Home Lives with:: Spouse Patient language and need for interpreter reviewed:: Yes Do you feel safe going back to the place where you live?: Yes          Current home services: DME (shower seat) Criminal Activity/Legal Involvement Pertinent to Current Situation/Hospitalization: No - Comment as needed  Activities of Daily  Living   ADL Screening (condition at time of admission) Independently performs ADLs?: No Does the patient have a NEW difficulty with bathing/dressing/toileting/self-feeding that is expected to last >3 days?: Yes (Initiates electronic notice to provider for possible OT consult) Does the patient have a NEW difficulty with getting in/out of bed, walking, or climbing stairs that is expected to last >3 days?: Yes (Initiates electronic notice to provider for possible PT consult) Does the patient have a NEW difficulty with communication that is expected to last >3 days?: Yes (Initiates electronic notice to provider for possible SLP consult) Is the patient deaf or have difficulty hearing?: Yes Does the patient have difficulty seeing, even when wearing glasses/contacts?: No Does the patient have difficulty concentrating, remembering, or making decisions?: Yes  Permission Sought/Granted                  Emotional Assessment Appearance:: Appears stated age Attitude/Demeanor/Rapport: Engaged Affect (typically observed): Accepting Orientation: : Oriented to Self, Oriented to Place   Psych Involvement: No (comment)  Admission diagnosis:  Aphasia [R47.01] Dysarthria [R47.1] Altered mental status, unspecified altered mental status type [R41.82] Patient Active Problem List   Diagnosis Date Noted   Dysarthria 05/16/2024   Chest pain 01/23/2019   Abdominal pain 01/23/2019   Hypertensive urgency 01/23/2019   H/O: CVA (cerebrovascular accident) 01/23/2019   Stroke (HCC) 08/05/2012   Hypertension  08/05/2012   Tobacco abuse 08/05/2012   History of cocaine abuse (HCC)    History of alcohol abuse    PCP:  Hyacinth Honey, NP Pharmacy:   [facility] [address] [place] - [address] [place] [address] Phone: [phone] Fax: [phone]  [facility] - [place], [place] - [address] [place] [address] Phone: [phone] Fax:  [phone]     Social Drivers of Health (SDOH) Social History: SDOH Screenings   Food Insecurity: No Food Insecurity (05/18/2024)  Housing: Low Risk  (05/18/2024)  Transportation Needs: No Transportation Needs (05/18/2024)  Utilities: Not At Risk (05/18/2024)  Tobacco Use: High Risk (05/19/2024)   SDOH Interventions:     Readmission Risk Interventions     No data to display

## 2024-05-19 NOTE — Progress Notes (Addendum)
 PROGRESS NOTE    [name]  FMW:[id number] DOB: 08/11/1961 DOA: 05/16/2024 PCP: Hyacinth Honey, NP   Brief Narrative:   [name] is a 63 y.o. male with medical history significant of prior CVA, hypertension, remote history of alcohol and cocaine use.  Presented to [facility] on [facility]/5 via private vehicle with family reporting increased confusion and expressive aphasia for 2 days.  No obvious unilateral weakness noted.  He reported also some slight blurred vision.  Out of thrombolytic window.  Found to have acute L MCA territory stroke with more subacute appearing infarct in the right posterior temporal lobe without any hemorrhage or mass effect.  Assessment & Plan:   Principal Problem:   Dysarthria      Assessment and Plan: Acute bilateral ischemic CVA History of prior CVA with progressive small vessel disease Presented with dysarthria and later found to have sensation deficits on the left side and mild motor defects on the right    Patient does have word finding difficulties.  No focal motor deficits noted.  Patient admitted for a stroke workup.  MRI of the brain reveals acute ischemia in the left MCA territory and additionally subacute appearing cortical-based infarct at the right posterior temporal lobe, no hemorrhage.  No mass effect.  Other chronic ischemia noted. CTA shows right M3 occlusion.   Patient also underwent EEG which was negative for any epileptiform tendency. Continue telemetry monitoring to evaluate for arrhythmia specifically atrial fibrillation Echocardiogram-LVEF 50 to 55%, no PFO or thrombus. Continue aspirin  81 mg daily, Plavix , Crestor   Hemoglobin A1c 5.6 PT/OT/SLP evaluation---> plan for outpatient therapies. Was found to have sleep apnea and sleep is for trial.  Will have CPAP trial and potentially discharge tomorrow.   Hypertension resume home antihypertensive agents (Norvasc , labetalol ,    BPH Continue Flomax    Substance abuse Patient denied  ongoing use of alcohol or cocaine UDS found to be positive for THC Continues to smoke half a pack per day       Advance Care Planning:   Code Status: Full Code    VTE prophylaxis: Lovenox    Consults: Neurology   Family Communication: Patient only   Severity of Illness: The appropriate patient status for this patient is OBSERVATION. Observation status is judged to be reasonable and necessary in order to provide the required intensity of service to ensure the patient's safety. The patient's presenting symptoms, physical exam findings, and initial radiographic and laboratory data in the context of their medical condition is felt to place them at decreased risk for further clinical deterioration. Furthermore, it is anticipated that the patient will be medically stable for discharge from the hospital within 2 midnights of admis    Consultants:   Procedures:   Antimicrobials:    Subjective:  Patient seen and examined at the bedside.  He says he feels better.  He still has some word finding difficulties but significantly improved.  No focal motor or sensory deficits.  He was found to have sleep apnea and smart sleep test.  Will have CPAP placed tonight.  Objective: Vitals:   05/19/24 0333 05/19/24 0907 05/19/24 1019 05/19/24 1257  BP: (!) 150/84 (!) 154/76  122/61  Pulse: 60 63  (!) 54  Resp: 20   20  Temp: 99 F (37.2 C) 99.7 F (37.6 C)  98.3 F (36.8 C)  TempSrc: Oral Oral  Oral  SpO2: 94% 100%  100%  Weight:   86.2 kg   Height:   5' 7 (1.702  m)     Intake/Output Summary (Last 24 hours) at 05/19/2024 1449 Last data filed at 05/19/2024 9352 Gross per 24 hour  Intake 900 ml  Output 700 ml  Net 200 ml   Filed Weights   05/19/24 1019  Weight: 86.2 kg    Examination:  General exam: Appears calm and comfortable  Respiratory system: Bilateral decreased breath sounds at bases Cardiovascular system: S1 & S2 heard, Rate controlled Gastrointestinal system: Abdomen is  nondistended, soft and nontender. Normal bowel sounds heard. Extremities: No cyanosis, clubbing, edema  Central nervous system: Alert and oriented. No focal neurological deficits. Moving extremities Skin: No rashes, lesions or ulcers Psychiatry: Judgement and insight appear normal. Mood & affect appropriate.     Data Reviewed: I have personally reviewed following labs and imaging studies  CBC: Recent Labs  Lab 05/16/24 1805  WBC 9.0  NEUTROABS 5.5  HGB 14.1  HCT 40.7  MCV 89.3  PLT 248   Basic Metabolic Panel: Recent Labs  Lab 05/16/24 1805  NA 136  K 3.8  CL 101  CO2 22  GLUCOSE 97  BUN 8  CREATININE 1.01  CALCIUM  10.0   GFR: Estimated Creatinine Clearance: 80.5 mL/min (by C-G formula based on SCr of 1.01 mg/dL). Liver Function Tests: Recent Labs  Lab 05/16/24 1805  AST 20  ALT 11  ALKPHOS 91  BILITOT 0.5  PROT 8.0  ALBUMIN 4.5   No results for input(s): LIPASE, AMYLASE in the last 168 hours. No results for input(s): AMMONIA in the last 168 hours. Coagulation Profile: Recent Labs  Lab 05/16/24 1805  INR 1.1   Cardiac Enzymes: No results for input(s): CKTOTAL, CKMB, CKMBINDEX, TROPONINI in the last 168 hours. BNP (last 3 results) No results for input(s): PROBNP in the last 8760 hours. HbA1C: Recent Labs    05/17/24 1205  HGBA1C 5.6   CBG: No results for input(s): GLUCAP in the last 168 hours. Lipid Profile: Recent Labs    05/18/24 0554  CHOL 162  HDL 26*  LDLCALC 95  TRIG 795*  CHOLHDL 6.2   Thyroid Function Tests: No results for input(s): TSH, T4TOTAL, FREET4, T3FREE, THYROIDAB in the last 72 hours. Anemia Panel: No results for input(s): VITAMINB12, FOLATE, FERRITIN, TIBC, IRON, RETICCTPCT in the last 72 hours. Sepsis Labs: No results for input(s): PROCALCITON, LATICACIDVEN in the last 168 hours.  Recent Results (from the past 240 hours)  MRSA Next Gen by PCR, Nasal     Status: None    Collection Time: 05/18/24 10:43 AM   Specimen: Nasal Mucosa; Nasal Swab  Result Value Ref Range Status   MRSA by PCR Next Gen NOT DETECTED NOT DETECTED Final    Comment: (NOTE) The GeneXpert MRSA Assay (FDA approved for NASAL specimens only), is one component of a comprehensive MRSA colonization surveillance program. It is not intended to diagnose MRSA infection nor to guide or monitor treatment for MRSA infections. Test performance is not FDA approved in patients less than 20 years old. Performed at [facility], [address] [place], [place] [address]          Radiology Studies: ECHOCARDIOGRAM COMPLETE Result Date: 05/17/2024    ECHOCARDIOGRAM REPORT   Patient Name:   [name] Date of Exam: 05/17/2024 Medical Rec #:  [id number]      Height:       67.0 in Accession #:    [id number]     Weight:       180.0 lb Date of  Birth:  03-01-1961      BSA:          1.934 m Patient Age:    63 years       BP:           150/83 mmHg Patient Gender: M              HR:           48 bpm. Exam Location:  Inpatient Procedure: 2D Echo, Cardiac Doppler and Color Doppler (Both Spectral and Color            Flow Doppler were utilized during procedure). Indications:    Stroke I63.9  History:        Patient has prior history of Echocardiogram examinations, most                 recent 01/24/2019. Stroke; Risk Factors:Current Smoker,                 Dyslipidemia and Hypertension.  Sonographer:    BERNARDA ROCKS Referring Phys: ALLISON L ELLIS IMPRESSIONS  1. Low normal to mildly reduced LV function (EF 50).  2. Left ventricular ejection fraction, by estimation, is 50 to 55%. The left ventricle has low normal function. The left ventricle has no regional wall motion abnormalities. The left ventricular internal cavity size was mildly dilated. Left ventricular diastolic parameters are consistent with Grade I diastolic dysfunction (impaired relaxation).  3. Right ventricular systolic function is normal. The right  ventricular size is normal. There is normal pulmonary artery systolic pressure.  4. Left atrial size was mildly dilated.  5. The mitral valve is normal in structure. Trivial mitral valve regurgitation. No evidence of mitral stenosis.  6. The aortic valve is tricuspid. Aortic valve regurgitation is trivial. Aortic valve sclerosis is present, with no evidence of aortic valve stenosis.  7. Aortic dilatation noted. There is mild dilatation of the ascending aorta, measuring 42 mm.  8. The inferior vena cava is normal in size with greater than 50% respiratory variability, suggesting right atrial pressure of 3 mmHg. FINDINGS  Left Ventricle: Left ventricular ejection fraction, by estimation, is 50 to 55%. The left ventricle has low normal function. The left ventricle has no regional wall motion abnormalities. The left ventricular internal cavity size was mildly dilated. There is no left ventricular hypertrophy. Left ventricular diastolic parameters are consistent with Grade I diastolic dysfunction (impaired relaxation). Right Ventricle: The right ventricular size is normal. Right ventricular systolic function is normal. There is normal pulmonary artery systolic pressure. Left Atrium: Left atrial size was mildly dilated. Right Atrium: Right atrial size was normal in size. Pericardium: There is no evidence of pericardial effusion. Mitral Valve: The mitral valve is normal in structure. Mild mitral annular calcification. Trivial mitral valve regurgitation. No evidence of mitral valve stenosis. Tricuspid Valve: The tricuspid valve is normal in structure. Tricuspid valve regurgitation is trivial. No evidence of tricuspid stenosis. Aortic Valve: The aortic valve is tricuspid. Aortic valve regurgitation is trivial. Aortic valve sclerosis is present, with no evidence of aortic valve stenosis. Pulmonic Valve: The pulmonic valve was normal in structure. Pulmonic valve regurgitation is not visualized. No evidence of pulmonic stenosis.  Aorta: Aortic dilatation noted. There is mild dilatation of the ascending aorta, measuring 42 mm. Venous: The inferior vena cava is normal in size with greater than 50% respiratory variability, suggesting right atrial pressure of 3 mmHg. IAS/Shunts: No atrial level shunt detected by color flow Doppler. Additional Comments: Low normal to  mildly reduced LV function (EF 50).  LEFT VENTRICLE PLAX 2D LVIDd:         5.50 cm      Diastology LVIDs:         3.50 cm      LV e' medial:    6.09 cm/s LV PW:         0.90 cm      LV E/e' medial:  8.2 LV IVS:        0.90 cm      LV e' lateral:   7.62 cm/s LVOT diam:     2.00 cm      LV E/e' lateral: 6.5 LV SV:         55 LV SV Index:   28 LVOT Area:     3.14 cm  LV Volumes (MOD) LV vol d, MOD A2C: 184.0 ml LV vol d, MOD A4C: 200.0 ml LV vol s, MOD A2C: 82.5 ml LV vol s, MOD A4C: 78.2 ml LV SV MOD A2C:     101.5 ml LV SV MOD A4C:     200.0 ml LV SV MOD BP:      118.8 ml RIGHT VENTRICLE             IVC RV Basal diam:  3.30 cm     IVC diam: 1.90 cm RV S prime:     10.90 cm/s TAPSE (M-mode): 2.7 cm LEFT ATRIUM             Index        RIGHT ATRIUM           Index LA diam:        3.30 cm 1.71 cm/m   RA Area:     17.00 cm LA Vol (A2C):   81.2 ml 41.99 ml/m  RA Volume:   40.30 ml  20.84 ml/m LA Vol (A4C):   38.3 ml 19.81 ml/m LA Biplane Vol: 60.0 ml 31.03 ml/m  AORTIC VALVE                    PULMONIC VALVE AV Area (Vmax):    2.01 cm     PV Vmax:       0.64 m/s AV Area (Vmean):   1.78 cm     PV Peak grad:  1.6 mmHg AV Area (VTI):     1.93 cm AV Vmax:           135.00 cm/s AV Vmean:          90.600 cm/s AV VTI:            0.285 m AV Peak Grad:      7.3 mmHg AV Mean Grad:      4.0 mmHg LVOT Vmax:         86.20 cm/s LVOT Vmean:        51.200 cm/s LVOT VTI:          0.175 m LVOT/AV VTI ratio: 0.61  AORTA Ao Root diam: 3.80 cm Ao Asc diam:  4.20 cm MITRAL VALVE MV Area (PHT): 1.91 cm    SHUNTS MV Decel Time: 398 msec    Systemic VTI:  0.18 m MV E velocity: 49.70 cm/s  Systemic Diam:  2.00 cm MV A velocity: 73.40 cm/s MV E/A ratio:  0.68 Redell Shallow MD Electronically signed by Redell Shallow MD Signature Date/Time: 05/17/2024/4:05:49 PM    Final         Scheduled Meds:  amLODipine   10  mg Oral Daily   aspirin  EC  81 mg Oral Daily   clopidogrel   75 mg Oral Daily   enoxaparin  (LOVENOX ) injection  40 mg Subcutaneous Q24H   labetalol   200 mg Oral BID   multivitamin with minerals  1 tablet Oral Daily   rosuvastatin   20 mg Oral Daily   sodium chloride  flush  3 mL Intravenous Once   tamsulosin   0.4 mg Oral Daily   Continuous Infusions:        Roneka Gilpin, MD [facility] 05/19/2024, 2:49 PM

## 2024-05-19 NOTE — Plan of Care (Signed)
  Problem: Health Behavior/Discharge Planning: Goal: Ability to manage health-related needs will improve Outcome: Progressing   Problem: Clinical Measurements: Goal: Will remain free from infection Outcome: Progressing   Problem: Clinical Measurements: Goal: Diagnostic test results will improve Outcome: Progressing   Problem: Activity: Goal: Risk for activity intolerance will decrease Outcome: Progressing   Problem: Nutrition: Goal: Adequate nutrition will be maintained Outcome: Progressing   Problem: Coping: Goal: Level of anxiety will decrease Outcome: Progressing   Problem: Safety: Goal: Ability to remain free from injury will improve Outcome: Progressing   Problem: Skin Integrity: Goal: Risk for impaired skin integrity will decrease Outcome: Progressing   Problem: Ischemic Stroke/TIA Tissue Perfusion: Goal: Complications of ischemic stroke/TIA will be minimized Outcome: Progressing

## 2024-05-20 DIAGNOSIS — R471 Dysarthria and anarthria: Secondary | ICD-10-CM | POA: Diagnosis not present

## 2024-05-20 DIAGNOSIS — I739 Peripheral vascular disease, unspecified: Secondary | ICD-10-CM | POA: Diagnosis not present

## 2024-05-20 DIAGNOSIS — R29701 NIHSS score 1: Secondary | ICD-10-CM | POA: Diagnosis not present

## 2024-05-20 DIAGNOSIS — I6350 Cerebral infarction due to unspecified occlusion or stenosis of unspecified cerebral artery: Secondary | ICD-10-CM | POA: Diagnosis not present

## 2024-05-20 DIAGNOSIS — I6381 Other cerebral infarction due to occlusion or stenosis of small artery: Secondary | ICD-10-CM | POA: Diagnosis not present

## 2024-05-20 MED ORDER — ASPIRIN 81 MG PO TBEC: 81.0000 mg | DELAYED_RELEASE_TABLET | Freq: Every day | ORAL | 0 refills | Status: DC

## 2024-05-20 MED ORDER — ROSUVASTATIN CALCIUM 20 MG PO TABS: 20.0000 mg | ORAL_TABLET | Freq: Every day | ORAL | 2 refills | Status: DC

## 2024-05-20 MED ORDER — CLOPIDOGREL BISULFATE 75 MG PO TABS: 75.0000 mg | ORAL_TABLET | Freq: Every day | ORAL | 2 refills | Status: DC

## 2024-05-20 NOTE — Progress Notes (Signed)
 STROKE TEAM PROGRESS NOTE   INTERIM HISTORY/SUBJECTIVE No Family at he bedside. No new neurological events overnight.  Neurological exam remains stable and unchanged.  Continues to have mild expressive speech difficulties Patient did sign consent to participate in the sleep smart study and tested positive in the overnight marks to monitor and was able to tolerate CPAP   for 6 hours last night.  He has been randomized to medical treatment of the sleep smart stroke prevention trial.   CBC    Component Value Date/Time   WBC 9.0 05/16/2024 1805   RBC 4.56 05/16/2024 1805   HGB 14.1 05/16/2024 1805   HCT 40.7 05/16/2024 1805   PLT 248 05/16/2024 1805   MCV 89.3 05/16/2024 1805   MCH 30.9 05/16/2024 1805   MCHC 34.6 05/16/2024 1805   RDW 13.8 05/16/2024 1805   LYMPHSABS 2.9 05/16/2024 1805   MONOABS 0.5 05/16/2024 1805   EOSABS 0.0 05/16/2024 1805   BASOSABS 0.0 05/16/2024 1805    BMET    Component Value Date/Time   NA 136 05/16/2024 1805   K 3.8 05/16/2024 1805   CL 101 05/16/2024 1805   CO2 22 05/16/2024 1805   GLUCOSE 97 05/16/2024 1805   BUN 8 05/16/2024 1805   CREATININE 1.01 05/16/2024 1805   CALCIUM  10.0 05/16/2024 1805   GFRNONAA >60 05/16/2024 1805    IMAGING past 24 hours No results found.   Vitals:   05/19/24 1954 05/20/24 0325 05/20/24 0720 05/20/24 1104  BP: (!) 150/78 134/68 (!) 149/79 128/70  Pulse: (!) 58 (!) 52 (!) 51 (!) 54  Resp: 18 16  20   Temp: 98.6 F (37 C) 98.4 F (36.9 C) 98.3 F (36.8 C) 98.4 F (36.9 C)  TempSrc: Oral Oral Oral Oral  SpO2: 98% 94% 96% 98%  Weight:      Height:         PHYSICAL EXAM General:  Alert, well-nourished, well-developed patient in no acute distress Psych:  Mood and affect appropriate for situation CV: Regular rate and rhythm on monitor Respiratory:  Regular, unlabored respirations on room air GI: Abdomen soft and nontender   NEURO:  Mental Status: AA&Ox3, patient is able to give clear and coherent  history Speech/Language: speech is without dysarthria or aphasia.  Naming, repetition, fluency, and comprehension intact.  Has some decreased fluency of speech  Cranial Nerves:  II: PERRL. Visual fields full.  III, IV, VI: EOMI. Eyelids elevate symmetrically.  V: Sensation is intact to light touch and symmetrical to face.  VII: Face is symmetrical resting and smiling VIII: hearing intact to voice. IX, X: Palate elevates symmetrically. Phonation is normal.  KP:Dynloizm shrug 5/5. XII: tongue is midline without fasciculations. Motor: 5/5 strength to all muscle groups tested.  Tone: is normal and bulk is normal Sensation- Intact to light touch bilaterally. Extinction absent to light touch to DSS.   Coordination: FTN intact bilaterally, HKS: no ataxia in BLE.No drift.  Diminished fine motor skills on left, with slight right over left orbiting Gait- deferred  Most Recent NIH  1a Level of Conscious.:  1b LOC Questions:  1c LOC Commands:  2 Best Gaze:  3 Visual:  4 Facial Palsy:  5a Motor Arm - left:  5b Motor Arm - Right:  6a Motor Leg - Left:  6b Motor Leg - Right:  7 Limb Ataxia:  8 Sensory:  9 Best Language: 1 10 Dysarthria:  11 Extinct. and Inatten.:  TOTAL: 1   ASSESSMENT/PLAN  Mr. Erik Peifer  Hudson is a 63 y.o. male with history of EtOH abuse, cocaine abuse, HLD, HTN, stroke in 2013 without residual deficit, and hepatitis B who presented to [facility] 9/5 for evaluation of dysarthria and aphasia since Monday, 05/12/2024 when he woke from sleeping  NIH on Admission 2  Acute Ischemic Infarct:  bilateral lacunar infarct left corona radiata and lentiform and Right posterior temporal lobe Etiology: Small vessel disease and intracranial atherosclerosis uncontrolled risk factors CT head No acute abnormality. Small vessel disease. Atrophy.  Chronic cerebellar infarcts CTA head & neck Occlusion of a right M3. Intracranial atherosclerosis including severe stenoses of the proximal left  SCA, right posterior communicating artery, and left P1 segment and a mild stenosis of the basilar artery. MRI  Acute Ischemia lacunar infarct left corona radiata and lentiform and Right posterior temporal lobe. Advanced chronic small and medium-sized vessel ischemia with  EEG There was no seizure or seizure predisposition recorded on this study. Please note that lack of epileptiform activity on EEG does not preclude the possibility of epilepsy.  2D Echo EF 50 to 55%.  LV mildly dilated with grade 1 diastolic dysfunction.  Left atrium mildly dilated LDL 95 HgbA1c 5.6 VTE prophylaxis -Lovenox  aspirin  81 mg daily prior to admission, now on aspirin  81 mg daily and clopidogrel  75 mg daily for 3 weeks and then Plavix  alone. Therapy recommendations:  Pending Disposition: Pending  Hx of Stroke/TIA Chronic cerebellar infarcts  Hypertension Home meds: None, labetalol  200 mg as noted on med rec states he is not taking, amlodipine  10 mg, losartan  25 mg Stable Blood Pressure Goal: SBP less than 160   Hyperlipidemia Home meds: None LDL 95, goal < 70 Add Crestor  20 mg Continue statin at discharge  Tobacco Abuse Patient smokes half a pack per day       Ready to quit? No Nicotine  replacement therapy provided  Substance Abuse Alcohol abuse Patient with history of cocaine and THC use ETOH use, alcohol level <15, advised to drink no more than 1 drink(s) a day UDS positive for  [facility]       Ready to quit? No TOC consult for cessation placed  Dysphagia Patient has post-stroke dysphagia, SLP consulted    Diet   Diet Heart Room service appropriate? Yes; Fluid consistency: Thin   Advance diet as tolerated  Other Stroke Risk Factors Obesity,  BMI >/= 30 associated with increased stroke risk, recommend weight loss, diet and exercise as appropriate Congestive heart failure   Other Active Problems BPH  Obstructive sleep apnea.  Participating in the sleep smart stroke prevention study and  randomized to the medical treatment  Hospital day # 0  Patient is participating in sleep smart stroke prevention study and is randomized to the medical treatment.  Continue aspirin  and Plavix  for 3 weeks followed by aspirin  alone and aggressive risk factor modification.  Long discussion with patient at the bedside and answered questions.  Follow-up as an outpatient in [place] [facility] on December 8 at 3 PM as part of the sleep smart stroke study   I personally spent a total of 35 minutes in the care of the patient today including getting/reviewing separately obtained history, performing a medically appropriate exam/evaluation, counseling and educating, placing orders, referring and communicating with other health care professionals, documenting clinical information in the EHR, independently interpreting results, and coordinating care.        Eather Popp, MD Medical Director [facility] Pager: [phone] 05/20/2024 1:01 PM    To  contact Stroke Continuity provider, please refer to [email or web address]. After hours, contact General Neurology

## 2024-05-20 NOTE — TOC Transition Note (Signed)
 Transition of Care [facility]) - Discharge Note   Patient Details  Name: [name] MRN: [id number] Date of Birth: 06/18/1961  [facility] CM/SW Contact:  Andrez JULIANNA George, RN Phone Number: 05/20/2024, 1:09 PM   Clinical Narrative:     Pt is discharging home with outpatient therapy through [facility] outpt rehab. Information on the AVS.  CM securely emailed PCS form to [facility] medicaid: [email or web address] Pt has transportation home.  Final next level of care: OP Rehab Barriers to Discharge: No Barriers Identified   Patient Goals and CMS Choice   CMS Medicare.gov Compare Post Acute Care list provided to:: Patient Represenative (must comment) Choice offered to / list presented to : Spouse      Discharge Placement                       Discharge Plan and Services Additional resources added to the After Visit Summary for     Discharge Planning Services: CM Consult                                 Social Drivers of Health (SDOH) Interventions SDOH Screenings   Food Insecurity: No Food Insecurity (05/18/2024)  Housing: Low Risk  (05/18/2024)  Transportation Needs: No Transportation Needs (05/18/2024)  Utilities: Not At Risk (05/18/2024)  Tobacco Use: High Risk (05/19/2024)     Readmission Risk Interventions     No data to display

## 2024-05-20 NOTE — Plan of Care (Signed)
 Problem: Education: Goal: Knowledge of General Education information will improve Description: Including pain rating scale, medication(s)/side effects and non-pharmacologic comfort measures 05/20/2024 0339 by Erik Odella KIDD, RN Outcome: Progressing 05/20/2024 0339 by Erik Odella KIDD, RN Outcome: Progressing   Problem: Health Behavior/Discharge Planning: Goal: Ability to manage health-related needs will improve 05/20/2024 0339 by Erik Odella KIDD, RN Outcome: Progressing 05/20/2024 0339 by Erik Odella KIDD, RN Outcome: Progressing   Problem: Clinical Measurements: Goal: Ability to maintain clinical measurements within normal limits will improve 05/20/2024 0339 by Erik Odella KIDD, RN Outcome: Progressing 05/20/2024 0339 by Erik Odella KIDD, RN Outcome: Progressing Goal: Will remain free from infection 05/20/2024 0339 by Erik Odella KIDD, RN Outcome: Progressing 05/20/2024 0339 by Erik Odella KIDD, RN Outcome: Progressing Goal: Diagnostic test results will improve 05/20/2024 0339 by Erik Odella KIDD, RN Outcome: Progressing 05/20/2024 0339 by Erik Odella KIDD, RN Outcome: Progressing Goal: Respiratory complications will improve 05/20/2024 0339 by Erik Odella KIDD, RN Outcome: Progressing 05/20/2024 0339 by Erik Odella KIDD, RN Outcome: Progressing Goal: Cardiovascular complication will be avoided 05/20/2024 0339 by Erik Odella KIDD, RN Outcome: Progressing 05/20/2024 0339 by Erik Odella KIDD, RN Outcome: Progressing   Problem: Activity: Goal: Risk for activity intolerance will decrease 05/20/2024 0339 by Erik Odella KIDD, RN Outcome: Progressing 05/20/2024 0339 by Erik Odella KIDD, RN Outcome: Progressing   Problem: Nutrition: Goal: Adequate nutrition will be maintained 05/20/2024 0339 by Erik Odella KIDD, RN Outcome: Progressing 05/20/2024 0339 by Erik Odella KIDD, RN Outcome: Progressing   Problem: Coping: Goal: Level of anxiety will decrease 05/20/2024 0339 by Erik Odella KIDD, RN Outcome: Progressing 05/20/2024 0339 by Erik Odella KIDD, RN Outcome: Progressing   Problem: Elimination: Goal: Will not experience complications related to bowel motility 05/20/2024 0339 by Erik Odella KIDD, RN Outcome: Progressing 05/20/2024 0339 by Erik Odella KIDD, RN Outcome: Progressing Goal: Will not experience complications related to urinary retention 05/20/2024 0339 by Erik Odella KIDD, RN Outcome: Progressing 05/20/2024 0339 by Erik Odella KIDD, RN Outcome: Progressing   Problem: Pain Managment: Goal: General experience of comfort will improve and/or be controlled 05/20/2024 0339 by Erik Odella KIDD, RN Outcome: Progressing 05/20/2024 0339 by Erik Odella KIDD, RN Outcome: Progressing   Problem: Safety: Goal: Ability to remain free from injury will improve 05/20/2024 0339 by Erik Odella KIDD, RN Outcome: Progressing 05/20/2024 0339 by Erik Odella KIDD, RN Outcome: Progressing   Problem: Skin Integrity: Goal: Risk for impaired skin integrity will decrease 05/20/2024 0339 by Erik Odella KIDD, RN Outcome: Progressing 05/20/2024 0339 by Erik Odella KIDD, RN Outcome: Progressing   Problem: Education: Goal: Knowledge of disease or condition will improve 05/20/2024 0339 by Erik Odella KIDD, RN Outcome: Progressing 05/20/2024 0339 by Erik Odella KIDD, RN Outcome: Progressing Goal: Knowledge of secondary prevention will improve (MUST DOCUMENT ALL) 05/20/2024 0339 by Erik Odella KIDD, RN Outcome: Progressing 05/20/2024 0339 by Erik Odella KIDD, RN Outcome: Progressing Goal: Knowledge of patient specific risk factors will improve (DELETE if not current risk factor) 05/20/2024 0339 by Erik Odella KIDD, RN Outcome: Progressing 05/20/2024 0339 by Erik Odella KIDD, RN Outcome: Progressing   Problem: Ischemic Stroke/TIA Tissue Perfusion: Goal: Complications of ischemic stroke/TIA will be minimized 05/20/2024 0339 by Erik Odella KIDD, RN Outcome: Progressing 05/20/2024  0339 by Erik Odella KIDD, RN Outcome: Progressing   Problem: Coping: Goal: Will verbalize positive feelings about self 05/20/2024 0339 by Erik Odella KIDD, RN Outcome: Progressing 05/20/2024 0339 by Erik Odella KIDD, RN Outcome: Progressing Goal: Will identify appropriate support needs 05/20/2024  [id number] by Erik Odella KIDD, RN Outcome: Progressing 05/20/2024 0339 by Erik Odella KIDD, RN Outcome: Progressing   Problem: Health Behavior/Discharge Planning: Goal: Ability to manage health-related needs will improve 05/20/2024 0339 by Erik Odella KIDD, RN Outcome: Progressing 05/20/2024 0339 by Erik Odella KIDD, RN Outcome: Progressing Goal: Goals will be collaboratively established with patient/family 05/20/2024 [id number] by Erik Odella KIDD, RN Outcome: Progressing 05/20/2024 0339 by Erik Odella KIDD, RN Outcome: Progressing   Problem: Self-Care: Goal: Ability to participate in self-care as condition permits will improve 05/20/2024 0339 by Erik Odella KIDD, RN Outcome: Progressing 05/20/2024 0339 by Erik Odella KIDD, RN Outcome: Progressing Goal: Verbalization of feelings and concerns over difficulty with self-care will improve 05/20/2024 0339 by Erik Odella KIDD, RN Outcome: Progressing 05/20/2024 0339 by Erik Odella KIDD, RN Outcome: Progressing Goal: Ability to communicate needs accurately will improve 05/20/2024 0339 by Erik Odella KIDD, RN Outcome: Progressing 05/20/2024 0339 by Erik Odella KIDD, RN Outcome: Progressing   Problem: Nutrition: Goal: Risk of aspiration will decrease 05/20/2024 0339 by Erik Odella KIDD, RN Outcome: Progressing 05/20/2024 0339 by Erik Odella KIDD, RN Outcome: Progressing Goal: Dietary intake will improve 05/20/2024 0339 by Erik Odella KIDD, RN Outcome: Progressing 05/20/2024 0339 by Erik Odella KIDD, RN Outcome: Progressing

## 2024-05-20 NOTE — Plan of Care (Signed)

## 2024-05-20 NOTE — Discharge Summary (Signed)
 Physician Discharge Summary  [name] FMW:[id number] DOB: 10-30-1960 DOA: 05/16/2024  PCP: Hyacinth Honey, NP  Admit date: 05/16/2024 Discharge date: 05/20/2024  Admitted From: Home Disposition: Home  Recommendations for Outpatient Follow-up:  Follow up with PCP in 1 week with repeat CBC/BMP Follow up in ED if symptoms worsen or new appear Follow-up with neurology   Discharge Condition: Stable CODE STATUS: Full Diet recommendation: Heart healthy  Brief/Interim Summary:  Brief Narrative:   [name] is a 63 y.o. male with medical history significant of prior CVA, hypertension, remote history of alcohol and cocaine use.  Presented to the [facility] on 9/5 via private vehicle with family reporting increased confusion and expressive aphasia for 2 days.  No obvious unilateral weakness noted.  He reported also some slight blurred vision.  Out of thrombolytic window.  Found to have acute L MCA territory stroke with more subacute appearing infarct in the right posterior temporal lobe without any hemorrhage or mass effect. Patient was admitted for stroke workup.  On finding difficulties somewhat improved but patient not back to baseline yet.   Assessment & Plan:   Principal Problem:   Dysarthria           Assessment and Plan: Acute bilateral ischemic CVA History of prior CVA with progressive small vessel disease Presented with dysarthria and later found to have sensation deficits on the left side and mild motor defects on the right     Patient does have word finding difficulties.  No focal motor deficits noted.  Patient admitted for a stroke workup.  MRI of the brain reveals acute ischemia in the left MCA territory and additionally subacute appearing cortical-based infarct at the right posterior temporal lobe, no hemorrhage.  No mass effect.  Other chronic ischemia noted. CTA shows right M3 occlusion.    Patient also underwent EEG which was negative for any epileptiform  tendency. Continue telemetry monitoring to evaluate for arrhythmia specifically atrial fibrillation Echocardiogram-LVEF 50 to 55%, no PFO or thrombus. Continue aspirin  81 mg daily, Plavix , Crestor .  Discontinue aspirin  after 3 weeks. Hemoglobin A1c 5.6 PT/OT/SLP evaluation---> plan for outpatient therapies.  Hypertension resume home antihypertensive agents (Norvasc , labetalol ,    BPH Continue Flomax    Substance abuse Patient denied ongoing use of alcohol or cocaine UDS found to be positive for THC Continues to smoke half a pack per day Discussed importance of smoking cessation/abstinence from alcohol    Discharge Diagnoses:  Principal Problem:   Dysarthria    Discharge Instructions  Discharge Instructions     Ambulatory referral to Occupational Therapy   Complete by: As directed    Ambulatory referral to Physical Therapy   Complete by: As directed    Ambulatory referral to Speech Therapy   Complete by: As directed    Diet - low sodium heart healthy   Complete by: As directed    Discharge instructions   Complete by: As directed    1.  Start dual antiplatelet therapy with aspirin  and Plavix .  Discontinue aspirin  after 3 weeks. 2.  Return to the ER if recurrence of neurological symptoms 3.  outpatient PT/OT.   Increase activity slowly   Complete by: As directed       Allergies as of 05/20/2024       Reactions   Lisinopril  Swelling   Doxycycline         Medication List     TAKE these medications    amLODipine  10 MG tablet Commonly known as: NORVASC  Take 1  tablet (10 mg total) by mouth daily.   aspirin  EC 81 MG tablet Take 1 tablet (81 mg total) by mouth daily. Swallow whole. Start taking on: May 21, 2024 What changed: additional instructions   clopidogrel  75 MG tablet Commonly known as: PLAVIX  Take 1 tablet (75 mg total) by mouth daily. Start taking on: May 21, 2024   labetalol  200 MG tablet Commonly known as: NORMODYNE  Take 1 tablet  (200 mg total) by mouth 2 (two) times daily.   losartan  25 MG tablet Commonly known as: COZAAR  Take 25 mg by mouth daily.   rosuvastatin  20 MG tablet Commonly known as: CRESTOR  Take 1 tablet (20 mg total) by mouth daily. Start taking on: May 21, 2024   tamsulosin  0.4 MG Caps capsule Commonly known as: Flomax  Take 1 capsule (0.4 mg total) by mouth daily.        Follow-up Information     [facility] Outpatient therapy. Schedule an appointment as soon as possible for a visit.   Contact information: [address] [place], [place] [address]   Main [phone]        Rosemarie Eather RAMAN, MD Follow up in 2 week(s).   Specialties: Neurology, Radiology Contact information: [address] [place] [address] [phone]         [facility] Medicaid for [facility] services Follow up.   Why: Call to follow up on caregivers if they dont reach out Contact information: [phone]               Allergies  Allergen Reactions   Lisinopril  Swelling   Doxycycline     Consultations:    Procedures/Studies: ECHOCARDIOGRAM COMPLETE Result Date: 05/17/2024    ECHOCARDIOGRAM REPORT   Patient Name:   [name] Date of Exam: 05/17/2024 Medical Rec #:  [id number]      Height:       67.0 in Accession #:    [id number]     Weight:       180.0 lb Date of Birth:  05/16/1961      BSA:          1.934 m Patient Age:    63 years       BP:           150/83 mmHg Patient Gender: M              HR:           48 bpm. Exam Location:  Inpatient Procedure: 2D Echo, Cardiac Doppler and Color Doppler (Both Spectral and Color            Flow Doppler were utilized during procedure). Indications:    Stroke I63.9  History:        Patient has prior history of Echocardiogram examinations, most                 recent 01/24/2019. Stroke; Risk Factors:Current Smoker,                 Dyslipidemia and Hypertension.  Sonographer:    BERNARDA ROCKS Referring Phys: ALLISON L ELLIS  IMPRESSIONS  1. Low normal to mildly reduced LV function (EF 50).  2. Left ventricular ejection fraction, by estimation, is 50 to 55%. The left ventricle has low normal function. The left ventricle has no regional wall motion abnormalities. The left ventricular internal cavity size was mildly dilated. Left ventricular diastolic parameters are consistent with Grade I diastolic dysfunction (impaired relaxation).  3. Right  ventricular systolic function is normal. The right ventricular size is normal. There is normal pulmonary artery systolic pressure.  4. Left atrial size was mildly dilated.  5. The mitral valve is normal in structure. Trivial mitral valve regurgitation. No evidence of mitral stenosis.  6. The aortic valve is tricuspid. Aortic valve regurgitation is trivial. Aortic valve sclerosis is present, with no evidence of aortic valve stenosis.  7. Aortic dilatation noted. There is mild dilatation of the ascending aorta, measuring 42 mm.  8. The inferior vena cava is normal in size with greater than 50% respiratory variability, suggesting right atrial pressure of 3 mmHg. FINDINGS  Left Ventricle: Left ventricular ejection fraction, by estimation, is 50 to 55%. The left ventricle has low normal function. The left ventricle has no regional wall motion abnormalities. The left ventricular internal cavity size was mildly dilated. There is no left ventricular hypertrophy. Left ventricular diastolic parameters are consistent with Grade I diastolic dysfunction (impaired relaxation). Right Ventricle: The right ventricular size is normal. Right ventricular systolic function is normal. There is normal pulmonary artery systolic pressure. Left Atrium: Left atrial size was mildly dilated. Right Atrium: Right atrial size was normal in size. Pericardium: There is no evidence of pericardial effusion. Mitral Valve: The mitral valve is normal in structure. Mild mitral annular calcification. Trivial mitral valve regurgitation. No  evidence of mitral valve stenosis. Tricuspid Valve: The tricuspid valve is normal in structure. Tricuspid valve regurgitation is trivial. No evidence of tricuspid stenosis. Aortic Valve: The aortic valve is tricuspid. Aortic valve regurgitation is trivial. Aortic valve sclerosis is present, with no evidence of aortic valve stenosis. Pulmonic Valve: The pulmonic valve was normal in structure. Pulmonic valve regurgitation is not visualized. No evidence of pulmonic stenosis. Aorta: Aortic dilatation noted. There is mild dilatation of the ascending aorta, measuring 42 mm. Venous: The inferior vena cava is normal in size with greater than 50% respiratory variability, suggesting right atrial pressure of 3 mmHg. IAS/Shunts: No atrial level shunt detected by color flow Doppler. Additional Comments: Low normal to mildly reduced LV function (EF 50).  LEFT VENTRICLE PLAX 2D LVIDd:         5.50 cm      Diastology LVIDs:         3.50 cm      LV e' medial:    6.09 cm/s LV PW:         0.90 cm      LV E/e' medial:  8.2 LV IVS:        0.90 cm      LV e' lateral:   7.62 cm/s LVOT diam:     2.00 cm      LV E/e' lateral: 6.5 LV SV:         55 LV SV Index:   28 LVOT Area:     3.14 cm  LV Volumes (MOD) LV vol d, MOD A2C: 184.0 ml LV vol d, MOD A4C: 200.0 ml LV vol s, MOD A2C: 82.5 ml LV vol s, MOD A4C: 78.2 ml LV SV MOD A2C:     101.5 ml LV SV MOD A4C:     200.0 ml LV SV MOD BP:      118.8 ml RIGHT VENTRICLE             IVC RV Basal diam:  3.30 cm     IVC diam: 1.90 cm RV S prime:     10.90 cm/s TAPSE (M-mode): 2.7 cm LEFT ATRIUM  Index        RIGHT ATRIUM           Index LA diam:        3.30 cm 1.71 cm/m   RA Area:     17.00 cm LA Vol (A2C):   81.2 ml 41.99 ml/m  RA Volume:   40.30 ml  20.84 ml/m LA Vol (A4C):   38.3 ml 19.81 ml/m LA Biplane Vol: 60.0 ml 31.03 ml/m  AORTIC VALVE                    PULMONIC VALVE AV Area (Vmax):    2.01 cm     PV Vmax:       0.64 m/s AV Area (Vmean):   1.78 cm     PV Peak grad:  1.6  mmHg AV Area (VTI):     1.93 cm AV Vmax:           135.00 cm/s AV Vmean:          90.600 cm/s AV VTI:            0.285 m AV Peak Grad:      7.3 mmHg AV Mean Grad:      4.0 mmHg LVOT Vmax:         86.20 cm/s LVOT Vmean:        51.200 cm/s LVOT VTI:          0.175 m LVOT/AV VTI ratio: 0.61  AORTA Ao Root diam: 3.80 cm Ao Asc diam:  4.20 cm MITRAL VALVE MV Area (PHT): 1.91 cm    SHUNTS MV Decel Time: 398 msec    Systemic VTI:  0.18 m MV E velocity: 49.70 cm/s  Systemic Diam: 2.00 cm MV A velocity: 73.40 cm/s MV E/A ratio:  0.68 Redell Shallow MD Electronically signed by Redell Shallow MD Signature Date/Time: 05/17/2024/4:05:49 PM    Final    CT ANGIO HEAD NECK W WO CM Result Date: 05/17/2024 CLINICAL DATA:  Stroke/TIA, determine embolic source. EXAM: CT ANGIOGRAPHY HEAD AND NECK WITH AND WITHOUT CONTRAST TECHNIQUE: Multidetector CT imaging of the head and neck was performed using the standard protocol during bolus administration of intravenous contrast. Multiplanar CT image reconstructions and MIPs were obtained to evaluate the vascular anatomy. Carotid stenosis measurements (when applicable) are obtained utilizing NASCET criteria, using the distal internal carotid diameter as the denominator. RADIATION DOSE REDUCTION: This exam was performed according to the departmental dose-optimization program which includes automated exposure control, adjustment of the mA and/or kV according to patient size and/or use of iterative reconstruction technique. CONTRAST:  75mL OMNIPAQUE  IOHEXOL  350 MG/ML SOLN COMPARISON:  Head CT 05/16/2024 and MRI 05/17/2024. Head MRA 08/05/2012. FINDINGS: CT HEAD FINDINGS Brain: As shown on today's MRI, there is an acute infarct involving the left basal ganglia and corona radiata and a more subacute appearing infarct laterally in the posterior right temporal lobe. There is a background of advanced chronic small vessel ischemia involving the cerebral white matter and deep gray nuclei as well as  brainstem, and there are chronic left larger than right cerebellar infarcts. There is mild cerebral atrophy. No acute intracranial hemorrhage, mass, midline shift, or extra-axial fluid collection is identified. Vascular: Calcified atherosclerosis at the skull base. Skull: No fracture or suspicious lesion. Sinuses/Orbits: Paranasal sinuses and mastoid air cells are clear. Unremarkable orbits. Other: None. Review of the MIP images confirms the above findings CTA NECK FINDINGS Aortic arch: Normal variant aortic arch branching pattern with common  origin of the brachiocephalic and left common carotid arteries. Mild atherosclerosis. No significant stenosis of the brachiocephalic or subclavian arteries. Right carotid system: Patent with moderate, predominantly calcified atherosclerosis throughout the common carotid artery and about the carotid bifurcation without evidence of a significant stenosis or dissection. Left carotid system: Patent with mild to moderate, predominantly calcified plaque in the mid common carotid artery and about the carotid bifurcation without evidence of a significant stenosis or dissection. Tortuous mid cervical ICA. Vertebral arteries: Patent with the right being mildly dominant. Mild diffuse atherosclerotic irregularity bilaterally and calcified atherosclerosis at both vertebral artery origins without evidence of a flow limiting stenosis. Skeleton: Mild cervical spondylosis. Other neck: No evidence of cervical lymphadenopathy or mass. Upper chest: Mild emphysema. Review of the MIP images confirms the above findings CTA HEAD FINDINGS Anterior circulation: The internal carotid arteries are patent from skull base to carotid termini with mild atherosclerosis bilaterally not resulting in a significant stenosis. ACAs and MCAs are patent with branch vessel atherosclerosis but no evidence of a proximal branch occlusion or significant A1 or M1 stenosis. There is occlusion of a right M3 branch in the  posterior right temporal region corresponding to the subacute infarct on MRI. No aneurysm is identified. Posterior circulation: The intracranial vertebral arteries are widely patent to the basilar. The right PICA appears patent proximally. A left PICA is again not visualized and may be chronically occluded. The basilar artery is patent with a new mild stenosis proximally. There is a new severe stenosis of the proximal left SCA. There are small right and large left posterior communicating arteries, likely with a severe stenosis of the distal right posterior communicating artery. There is also a severe stenosis of the distal left P1 segment. No aneurysm is identified. Venous sinuses: As permitted by contrast timing, patent. Anatomic variants: None of significance. Review of the MIP images confirms the above findings IMPRESSION: 1. Occlusion of a right M3 branch corresponding to the known subacute infarct in the posterior right temporal lobe. 2. Intracranial atherosclerosis including severe stenoses of the proximal left SCA, right posterior communicating artery, and left P1 segment and a mild stenosis of the basilar artery. 3. Cervical carotid atherosclerosis without significant stenosis. 4. Aortic Atherosclerosis (ICD10-I70.0) and Emphysema (ICD10-J43.9). Electronically Signed   By: Dasie Hamburg M.D.   On: 05/17/2024 13:25   MR BRAIN W WO CONTRAST Result Date: 05/17/2024 CLINICAL DATA:  63 year old male with neurologic deficit. Evidence of cerebral small vessel disease by CT. EXAM: MRI HEAD WITHOUT AND WITH CONTRAST TECHNIQUE: Multiplanar, multiecho pulse sequences of the brain and surrounding structures were obtained without and with intravenous contrast. CONTRAST:  8mL GADAVIST  GADOBUTROL  1 MMOL/ML IV SOLN COMPARISON:  Head CT yesterday.  Brain MRI 04/16/2017. FINDINGS: Brain: Advanced chronic small vessel disease. Superimposed confluent restricted diffusion tracking from the left corona radiata into the  lentiform, slightly greater than 2 cm size (series 3, image 22). T2 and FLAIR hyperintensity there. No hemorrhage or mass effect. No enhancement. Additional small area of left middle frontal gyrus cortical and subcortical white matter diffusion restriction (series 2, image 40 and series 3, image 24) in an area 8-9 mm. T2 and FLAIR hyperintensity. No hemorrhage or mass effect. No enhancement. Patchy superimposed restricted diffusion also in the posterior right temporal lobe, junction with the anterolateral right occipital lobe in an area of about 2.5 cm. Similar patchy, confluent T2 and FLAIR hyperintensity. No hemorrhage or mass effect. But this area does demonstrate patchy and gyriform post ischemic enhancement (coronal  images 13 and 14). Sizable chronic infarct in the left cerebellum PICA territory is chronic and was present in 2018. Progressed since that time patchy and confluent chronic infarcts in the contralateral right PICA territory, lacunar infarcts throughout bilateral central brainstem (series 5, image 10), bilateral basal ganglia, and in the central thalami (series 8, image 56). Limited chronic microhemorrhage on SWI, in the left thalamus. Progressed Patchy and confluent bilateral cerebral white matter T2 and FLAIR hyperintensity, now widespread. Small area of chronic cortical encephalomalacia anterior left inferior frontal gyrus on series 9, image 29 was present in 2018. And no other significant cerebral cortical encephalomalacia. No other abnormal intracranial enhancement. No dural thickening. No midline shift, mass effect, evidence of mass lesion, ventriculomegaly, extra-axial collection or acute intracranial hemorrhage. Cervicomedullary junction and pituitary are within normal limits. Vascular: Major intracranial vascular flow voids are preserved, stable since 2018. Chronic intracranial artery tortuosity. Following contrast major dural venous sinuses are enhancing and appear to be patent. Skull and  upper cervical spine: Negative. Visualized bone marrow signal is within normal limits. Sinuses/Orbits: Negative orbits. Paranasal Visualized paranasal sinuses and mastoids are stable and well aerated. Other: Visible internal auditory structures appear normal. Negative visible scalp and face. IMPRESSION: 1. Advanced chronic small and medium-sized vessel ischemia with substantial progression since a 2018 MRI. 2. Superimposed Acute Ischemia in the Left MCA territory: - confluent lacunar infarct left corona radiata and lentiform. - small cortically based infarct left inferior frontal gyrus AND additional more subacute appearing cortically based infarct at the Right posterior temporal lobe. No associated hemorrhage or mass effect at any location. Mild post ischemic enhancement of the temporal lobe infarct. Electronically Signed   By: VEAR Hurst M.D.   On: 05/17/2024 09:38   CT HEAD WO CONTRAST Result Date: 05/16/2024 EXAM: CT HEAD WITHOUT CONTRAST 05/16/2024 06:45:06 PM TECHNIQUE: CT of the head was performed without the administration of intravenous contrast. Automated exposure control, iterative reconstruction, and/or weight based adjustment of the mA/kV was utilized to reduce the radiation dose to as low as reasonably achievable. COMPARISON: CT Head dated 04/04/22 was used for comparison. CLINICAL HISTORY: Neuro deficit, acute, stroke suspected. FINDINGS: BRAIN AND VENTRICLES: Advanced chronic small vessel ischemic disease, progressed from 04/04/22 . Chronic infarcts in the left and right cerebellum. No acute hemorrhage. No definite acute infarct. No hydrocephalus. No extra-axial collection. No mass effect or midline shift. ORBITS: No acute abnormality. SINUSES: No acute abnormality. SOFT TISSUES AND SKULL: No acute soft tissue abnormality. No skull fracture. IMPRESSION: 1. Progression of advanced chronic small vessel ischemic disease compared to 04/04/22. If there is ongoing concern for acute stroke, MRI without  contrast is recommended. 2. Chronic cerebellar infarcts. Electronically signed by: Norman Gatlin MD 05/16/2024 06:55 PM EDT RP Workstation: [id number]      Subjective:   Discharge Exam: Vitals:   05/20/24 0720 05/20/24 1104  BP: (!) 149/79 128/70  Pulse: (!) 51 (!) 54  Resp:  20  Temp: 98.3 F (36.8 C) 98.4 F (36.9 C)  SpO2: 96% 98%    General: Pt is alert, awake, not in acute distress Cardiovascular: rate controlled, S1/S2 + Respiratory: bilateral decreased breath sounds at bases Abdominal: Soft, NT, ND, bowel sounds + Extremities: no edema, no cyanosis    The results of significant diagnostics from this hospitalization (including imaging, microbiology, ancillary and laboratory) are listed below for reference.     Microbiology: Recent Results (from the past 240 hours)  MRSA Next Gen by PCR, Nasal     Status:  None   Collection Time: 05/18/24 10:43 AM   Specimen: Nasal Mucosa; Nasal Swab  Result Value Ref Range Status   MRSA by PCR Next Gen NOT DETECTED NOT DETECTED Final    Comment: (NOTE) The GeneXpert MRSA Assay (FDA approved for NASAL specimens only), is one component of a comprehensive MRSA colonization surveillance program. It is not intended to diagnose MRSA infection nor to guide or monitor treatment for MRSA infections. Test performance is not FDA approved in patients less than 20 years old. Performed at [facility], [address], [place], [place] [address]      Labs: BNP (last 3 results) No results for input(s): BNP in the last 8760 hours. Basic Metabolic Panel: Recent Labs  Lab 05/16/24 1805  NA 136  K 3.8  CL 101  CO2 22  GLUCOSE 97  BUN 8  CREATININE 1.01  CALCIUM  10.0   Liver Function Tests: Recent Labs  Lab 05/16/24 1805  AST 20  ALT 11  ALKPHOS 91  BILITOT 0.5  PROT 8.0  ALBUMIN 4.5   No results for input(s): LIPASE, AMYLASE in the last 168 hours. No results for input(s): AMMONIA in the last 168  hours. CBC: Recent Labs  Lab 05/16/24 1805  WBC 9.0  NEUTROABS 5.5  HGB 14.1  HCT 40.7  MCV 89.3  PLT 248   Cardiac Enzymes: No results for input(s): CKTOTAL, CKMB, CKMBINDEX, TROPONINI in the last 168 hours. BNP: Invalid input(s): POCBNP CBG: No results for input(s): GLUCAP in the last 168 hours. D-Dimer No results for input(s): DDIMER in the last 72 hours. Hgb A1c No results for input(s): HGBA1C in the last 72 hours. Lipid Profile Recent Labs    05/18/24 0554  CHOL 162  HDL 26*  LDLCALC 95  TRIG 795*  CHOLHDL 6.2   Thyroid function studies No results for input(s): TSH, T4TOTAL, T3FREE, THYROIDAB in the last 72 hours.  Invalid input(s): FREET3 Anemia work up No results for input(s): VITAMINB12, FOLATE, FERRITIN, TIBC, IRON, RETICCTPCT in the last 72 hours. Urinalysis    Component Value Date/Time   COLORURINE YELLOW 05/16/2024 1751   APPEARANCEUR CLEAR 05/16/2024 1751   LABSPEC 1.029 05/16/2024 1751   PHURINE 5.5 05/16/2024 1751   GLUCOSEU NEGATIVE 05/16/2024 1751   HGBUR NEGATIVE 05/16/2024 1751   BILIRUBINUR NEGATIVE 05/16/2024 1751   KETONESUR NEGATIVE 05/16/2024 1751   PROTEINUR TRACE (A) 05/16/2024 1751   UROBILINOGEN 0.2 08/05/2012 1053   NITRITE NEGATIVE 05/16/2024 1751   LEUKOCYTESUR NEGATIVE 05/16/2024 1751   Sepsis Labs Recent Labs  Lab 05/16/24 1805  WBC 9.0   Microbiology Recent Results (from the past 240 hours)  MRSA Next Gen by PCR, Nasal     Status: None   Collection Time: 05/18/24 10:43 AM   Specimen: Nasal Mucosa; Nasal Swab  Result Value Ref Range Status   MRSA by PCR Next Gen NOT DETECTED NOT DETECTED Final    Comment: (NOTE) The GeneXpert MRSA Assay (FDA approved for NASAL specimens only), is one component of a comprehensive MRSA colonization surveillance program. It is not intended to diagnose MRSA infection nor to guide or monitor treatment for MRSA infections. Test performance is not  FDA approved in patients less than 50 years old. Performed at [facility], [address], [place], [place] [address]      Time coordinating discharge: 35 minutes  SIGNED:   Derryl Duval, MD  [facility] 05/20/2024, 4:21 PM

## 2024-05-20 NOTE — Progress Notes (Signed)
 Physical Therapy Treatment Patient Details Name: [name] MRN: [id number] DOB: April 20, 1961 Today's Date: 05/20/2024   History of Present Illness 63 y.o. male presents to [facility] 05/16/24 with confusion and expressive aphasia. MRI brain showed acute ischemia in L MCA and subacute cortical-based infarct in R temporal lobe. CTA showed R M3 occlusion. UDS + THC. PMHx: CVA, HTN, alcohol and cocaine use    PT Comments  Pt received in supine and agreeable to session. Pt able to perform gait and stair trials with CGA for safety due to some instability and weakness. Pt reports RLE weakness with increased difficulty during R single leg stance during marches, but no buckling or LOB. Education on reduced fall risk and activity progression. Pt continues to benefit from PT services to progress toward functional mobility goals.     If plan is discharge home, recommend the following: A little help with walking and/or transfers;A little help with bathing/dressing/bathroom;Assist for transportation;Help with stairs or ramp for entrance   Can travel by private vehicle        Equipment Recommendations  None recommended by PT    Recommendations for Other Services       Precautions / Restrictions Precautions Precautions: Fall Recall of Precautions/Restrictions: Impaired Restrictions Weight Bearing Restrictions Per Provider Order: No     Mobility  Bed Mobility Overal bed mobility: Modified Independent                  Transfers Overall transfer level: Needs assistance Equipment used: None Transfers: Sit to/from Stand Sit to Stand: Supervision                Ambulation/Gait Ambulation/Gait assistance: Contact guard assist Gait Distance (Feet): 200 Feet Assistive device: None Gait Pattern/deviations: Step-through pattern, Decreased stride length       General Gait Details: Slightly unsteady gait with no AD requiring CGA for safety, but no LOB. Cues for obstacle negotiation and  awareness.   Stairs Stairs: Yes Stairs assistance: Contact guard assist Stair Management: No rails Number of Stairs: 10 General stair comments: Increased weakness noted during descent   Wheelchair Mobility     Tilt Bed    Modified Rankin (Stroke Patients Only) Modified Rankin (Stroke Patients Only) Pre-Morbid Rankin Score: No symptoms Modified Rankin: Moderately severe disability     Balance Overall balance assessment: Needs assistance, History of Falls Sitting-balance support: No upper extremity supported, Feet supported Sitting balance-Leahy Scale: Good     Standing balance support: No upper extremity supported, During functional activity Standing balance-Leahy Scale: Fair                              Hotel manager: Impaired Factors Affecting Communication: Difficulty expressing self  Cognition Arousal: Alert Behavior During Therapy: Flat affect   PT - Cognitive impairments: Orientation, Memory, Sequencing, Problem solving, Safety/Judgement                       PT - Cognition Comments: some word finding difficulty. Slight impulsivity and decreased awareness of deficits requring cues for safety and education Following commands: Impaired Following commands impaired: Only follows one step commands consistently    Cueing Cueing Techniques: Verbal cues  Exercises Other Exercises Other Exercises: x5 serial STS without UE support Other Exercises: static standing marches without UE support    General Comments        Pertinent Vitals/Pain Pain Assessment Pain Assessment: Faces Faces Pain Scale: Hurts  little more Pain Location: B feet Pain Descriptors / Indicators: Aching Pain Intervention(s): Limited activity within patient's tolerance, Monitored during session     PT Goals (current goals can now be found in the care plan section) Acute Rehab PT Goals Patient Stated Goal: to get better PT Goal Formulation:  With patient Time For Goal Achievement: 06/01/24 Progress towards PT goals: Progressing toward goals    Frequency    Min 2X/week       AM-PAC PT 6 Clicks Mobility   Outcome Measure  Help needed turning from your back to your side while in a flat bed without using bedrails?: None Help needed moving from lying on your back to sitting on the side of a flat bed without using bedrails?: None Help needed moving to and from a bed to a chair (including a wheelchair)?: A Little Help needed standing up from a chair using your arms (e.g., wheelchair or bedside chair)?: A Little Help needed to walk in hospital room?: A Little Help needed climbing 3-5 steps with a railing? : A Little 6 Click Score: 20    End of Session Equipment Utilized During Treatment: Gait belt Activity Tolerance: Patient tolerated treatment well Patient left: with call bell/phone within reach;in chair Nurse Communication: Mobility status PT Visit Diagnosis: Other abnormalities of gait and mobility (R26.89);Unsteadiness on feet (R26.81)     Time: 9069-9049 PT Time Calculation (min) (ACUTE ONLY): 20 min  Charges:    $Gait Training: 8-22 mins PT General Charges $$ ACUTE PT VISIT: 1 Visit                     Darryle George, PTA Acute Rehabilitation Services Secure Chat Preferred  [phone]    Darryle George 05/20/2024, 11:47 AM

## 2024-07-21 ENCOUNTER — Ambulatory Visit: Admitting: Neurology

## 2024-07-21 ENCOUNTER — Inpatient Hospital Stay: Admitting: Neurology

## 2024-07-21 ENCOUNTER — Encounter: Payer: Self-pay | Admitting: Neurology

## 2024-07-21 VITALS — BP 120/80 | HR 71 | Ht 66.0 in | Wt 191.8 lb

## 2024-07-21 DIAGNOSIS — I69328 Other speech and language deficits following cerebral infarction: Secondary | ICD-10-CM | POA: Insufficient documentation

## 2024-07-21 DIAGNOSIS — E7849 Other hyperlipidemia: Secondary | ICD-10-CM | POA: Diagnosis not present

## 2024-07-21 DIAGNOSIS — I6381 Other cerebral infarction due to occlusion or stenosis of small artery: Secondary | ICD-10-CM | POA: Diagnosis not present

## 2024-07-21 DIAGNOSIS — R413 Other amnesia: Secondary | ICD-10-CM

## 2024-07-21 DIAGNOSIS — F17200 Nicotine dependence, unspecified, uncomplicated: Secondary | ICD-10-CM | POA: Diagnosis not present

## 2024-07-21 DIAGNOSIS — I10 Essential (primary) hypertension: Secondary | ICD-10-CM | POA: Insufficient documentation

## 2024-07-21 DIAGNOSIS — G3184 Mild cognitive impairment, so stated: Secondary | ICD-10-CM

## 2024-07-21 DIAGNOSIS — I69959 Hemiplegia and hemiparesis following unspecified cerebrovascular disease affecting unspecified side: Secondary | ICD-10-CM | POA: Insufficient documentation

## 2024-07-21 DIAGNOSIS — F339 Major depressive disorder, recurrent, unspecified: Secondary | ICD-10-CM | POA: Insufficient documentation

## 2024-07-21 DIAGNOSIS — R209 Unspecified disturbances of skin sensation: Secondary | ICD-10-CM | POA: Insufficient documentation

## 2024-07-21 DIAGNOSIS — G4733 Obstructive sleep apnea (adult) (pediatric): Secondary | ICD-10-CM

## 2024-07-21 DIAGNOSIS — R269 Unspecified abnormalities of gait and mobility: Secondary | ICD-10-CM | POA: Insufficient documentation

## 2024-07-21 MED ORDER — ROSUVASTATIN CALCIUM 20 MG PO TABS: 20.0000 mg | ORAL_TABLET | Freq: Every day | ORAL | 2 refills | Status: AC

## 2024-07-21 MED ORDER — CLOPIDOGREL BISULFATE 75 MG PO TABS: 75.0000 mg | ORAL_TABLET | Freq: Every day | ORAL | 2 refills | Status: AC

## 2024-07-21 NOTE — Progress Notes (Signed)
 [facility] [address] [place]. [place] [address] [phone]       OFFICE FOLLOW-UP NOTE  Mr. CRAWFORD TAMURA Date of Birth:  1961-05-11 Medical Record Number:  [id number]   HPI: Mr. Paulsen  is a 63 year old African-American male seen today for initial office follow-up visit following hospital consultation for stroke in September 2025.  He is accompanied by his wife.  History is obtained from them and review of electronic medical records and I personally reviewed pertinent available imaging films in PACS.  He has past medical history of alcohol and cocaine abuse, hypertension, hyperlipidemia, stroke in 2013 without residual deficits and hepatitis B.  He presented on 05/16/2024 for evaluation for dysarthria and aphasia which started 4 days prior when he woke up from sleep.  He had an NIH of 2 due to dysarthria and aphasia upon arrival and MRI scan of the brain showed acute subacute appearing cortically based infarct in the right posterior temporal lobe and left corona radiata.  There were advanced changes of chronic small vessel disease.  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  There is mild stenosis of the basilar artery.  EEG showed no seizure activity.  2D echo showed ejection fraction of 50 to 55% with mild left atrium and left ventricular dilatation with grade 1 diastolic dysfunction.  LDL cholesterol 95 mg percent.  Hemoglobin A1c was 5.6.  Telemetry monitoring in the hospital did not show any paroxysmal A-fib.  Patient was started on dual antiplatelet therapy aspirin  and Plavix  for 3 weeks followed by Plavix  alone.  Patient signed informed consent and participated in the sleep smart study and was randomized to medical treatment.  Patient states he has done well since discharge but he continues  to have cognitive and memory difficulties.  He has trouble speaking and cannot hold a conversation and has word finding difficulties.  He forgets what he wants to say often.  He is has difficulty with writing as well as reading.  He is currently doing outpatient physical occupational and speech therapies.  He is tolerating Plavix  well without bruising or bleeding.  His blood pressure is under good control and today it is 120/80.  He is tolerating Crestor  well without muscle aches and pains.  On Mini-Mental status testing today scored 23/30 clock drawing 3/4.  He was able to name only 3 animals which can walk on 4 legs.  Geriatric depression scale he scored 10 suggestive of mild depression.  He has not had any recurrent stroke or TIA symptoms..  ROS:   14 system review of systems is positive for speech difficulty, word finding difficulty difficulty in reading or writing, sleep apnea other systems negative  PMH:  Past Medical History:  Diagnosis Date   Anginal pain    Hepatitis B 1991   History of alcohol abuse    History of cocaine abuse (HCC)    Hypercholesterolemia    Hypertension    Mood disorder    NOS. Admitted to [facility] for detox in 12/2005   Stroke [facility]) 08/05/2012   denies residual (08/05/2012)   Tobacco abuse     Social History:  Social History   Socioeconomic History   Marital status: Married    Spouse name: Not on file   Number of children: 1   Years of education: 1y college   Highest  education level: Not on file  Occupational History   Occupation: FIELD OPERATIONS    Employer: UNEMPLOYED  Tobacco Use   Smoking status: Every Day    Current packs/day: 1.50    Average packs/day: 1.5 packs/day for 35.0 years (52.5 ttl pk-yrs)    Types: Cigarettes   Smokeless tobacco: Current    Types: Snuff  Vaping Use   Vaping status: Never Used  Substance and Sexual Activity   Alcohol use: Yes    Alcohol/week: 2.0 standard drinks of alcohol    Types: 2 Cans of beer per week     Comment: 1-2 beers a week / previously drank heavily until 2009 6 pack or more of beer daily.   Drug use: Yes    Frequency: 7.0 times per week    Types: Marijuana, Heroin, Cocaine    Comment: last use of crack cocaine 04/03/2022   Sexual activity: Yes    Comment: patient reports no longer uses cocaine.   Other Topics Concern   Not on file  Social History Narrative   Lives in [place], [place] alone.   Social Drivers of Corporate Investment Banker Strain: Not on file  Food Insecurity: No Food Insecurity (05/18/2024)   Hunger Vital Sign    Worried About Running Out of Food in the Last Year: Never true    Ran Out of Food in the Last Year: Never true  Transportation Needs: No Transportation Needs (05/18/2024)   PRAPARE - Administrator, Civil Service (Medical): No    Lack of Transportation (Non-Medical): No  Physical Activity: Not on file  Stress: Not on file  Social Connections: Not on file  Intimate Partner Violence: Not At Risk (05/18/2024)   Humiliation, Afraid, Rape, and Kick questionnaire    Fear of Current or Ex-Partner: No    Emotionally Abused: No    Physically Abused: No    Sexually Abused: No    Medications:   Current Outpatient Medications on File Prior to Visit  Medication Sig Dispense Refill   amLODipine  (NORVASC ) 10 MG tablet Take 1 tablet (10 mg total) by mouth daily. 30 tablet 1   clopidogrel  (PLAVIX ) 75 MG tablet Take 1 tablet (75 mg total) by mouth daily. 30 tablet 2   rosuvastatin  (CRESTOR ) 20 MG tablet Take 1 tablet (20 mg total) by mouth daily. 30 tablet 2   tamsulosin  (FLOMAX ) 0.4 MG CAPS capsule Take 1 capsule (0.4 mg total) by mouth daily. 30 capsule 1   aspirin  EC 81 MG tablet Take 1 tablet (81 mg total) by mouth daily. Swallow whole. (Patient not taking: Reported on 07/21/2024) 21 tablet 0   labetalol  (NORMODYNE ) 200 MG tablet Take 1 tablet (200 mg total) by mouth 2 (two) times daily. (Patient not taking: Reported on 07/21/2024) 30 tablet 2    losartan  (COZAAR ) 25 MG tablet Take 25 mg by mouth daily. (Patient not taking: Reported on 07/21/2024)     No current facility-administered medications on file prior to visit.    Allergies:   Allergies  Allergen Reactions   Lisinopril  Swelling   Doxycycline     Physical Exam General: well developed, well nourished, seated, in no evident distress Head: head normocephalic and atraumatic.  Neck: supple with no carotid or supraclavicular bruits Cardiovascular: regular rate and rhythm, no murmurs Musculoskeletal: no deformity Skin:  no rash/petichiae Vascular:  Normal pulses all extremities Vitals:   07/21/24 1004  BP: 120/80  Pulse: 71  SpO2: 97%   Neurologic Exam Mental Status:  Awake and fully alert. Oriented to place and time. Recent and remote memory intact. Attention span, concentration and fund of knowledge appropriate. Mood and affect appropriate.  Slightly nonfluent speech with mild word finding difficulties and occasional paraphasic errors.  Good comprehension, naming and repetition Cranial Nerves: Fundoscopic exam reveals sharp disc margins. Pupils equal, briskly reactive to light. Extraocular movements full without nystagmus. Visual fields full to confrontation. Hearing intact. Facial sensation intact. Face, tongue, palate moves normally and symmetrically.  Motor: Normal bulk and tone. Normal strength in all tested extremity muscles. Sensory.: intact to touch ,pinprick .position and vibratory sensation.  Coordination: Rapid alternating movements normal in all extremities. Finger-to-nose and heel-to-shin performed accurately bilaterally. Gait and Station: Arises from chair without difficulty. Stance is normal. Gait demonstrates normal stride length and balance . Able to heel, toe and tandem walk without difficulty.  Reflexes: 1+ and symmetric. Toes downgoing.   NIHSS  1 Modified Rankin  3    07/21/2024   10:06 AM  MMSE - Mini Mental State Exam  Orientation to time 4   Orientation to Place 5  Registration 3  Attention/ Calculation 1  Recall 2  Language- name 2 objects 2  Language- repeat 1  Language- follow 3 step command 2  Language- read & follow direction 1  Write a sentence 1  Copy design 1  Total score 23      ASSESSMENT: 63 year old African-American male with bilateral lacunar infarcts in September 2025 due to small vessel disease.  Vascular risk factors of hypertension, hyperlipidemia, mild obesity, sleep apnea and smoking.  He still has significant residual aphasia and mild cognitive impairment.  Patient is participating with sleep smart stroke prevention study but was randomized to medical treatment     PLAN:I had a long d/w patient and his wife about his recent lacunar strokes, post stroke mild cognitive impairment, sleep apnoea,, risk for recurrent stroke/TIAs, personally independently reviewed imaging studies and stroke evaluation results and answered questions.Continue Plavix  75 mg daily  for secondary stroke prevention and maintain strict control of hypertension with blood pressure goal below 130/90, diabetes with hemoglobin A1c goal below 6.5% and lipids with LDL cholesterol goal below 70 mg/dL. I also advised the patient to eat a healthy diet with plenty of whole grains, cereals, fruits and vegetables, exercise regularly and maintain ideal body weight .SABRACheck follow-up lipid profile today.  I counseled the patient to quit smoking and he says he will try.  We also discussed treatment options for sleep apnea but patient is refusing CPAP mask at this time.  I advised him to increase participation in cognitively challenging activities like solving crossword puzzles, playing bridge and sudoku.  We also discussed memory compensation strategies.  I offered referral to sleep clinic for evaluation for CPAP for sleep apnea but patient is refusing this at the present time.  Followup in the future with my nurse practitioner in 6 months or call earlier if  necessary.  Greater than 50% of time during this 40 minute visit was spent on counseling,explanation of diagnosis, planning of further management, discussion with patient and family and coordination of care Eather Popp, MD Note: This document was prepared with digital dictation and possible smart phrase technology. Any transcriptional errors that result from this process are unintentional

## 2024-07-21 NOTE — Patient Instructions (Signed)
 I had a long d/w patient and his wife about his recent lacunar strokes, post stroke mild cognitive impairment, sleep apnoea,, risk for recurrent stroke/TIAs, personally independently reviewed imaging studies and stroke evaluation results and answered questions.Continue Plavix  75 mg daily  for secondary stroke prevention and maintain strict control of hypertension with blood pressure goal below 130/90, diabetes with hemoglobin A1c goal below 6.5% and lipids with LDL cholesterol goal below 70 mg/dL. I also advised the patient to eat a healthy diet with plenty of whole grains, cereals, fruits and vegetables, exercise regularly and maintain ideal body weight .SABRACheck follow-up lipid profile today.  I counseled the patient to quit smoking and he says he will try.  We also discussed treatment options for sleep apnea but patient is refusing CPAP mask at this time.  I advised him to increase participation in cognitively challenging activities like solving crossword puzzles, playing bridge and sudoku.  We also discussed memory compensation strategies.  Followup in the future with my nurse practitioner in 6 months or call earlier if necessary.  Memory Compensation Strategies  Use WARM strategy.  W= write it down  A= associate it  R= repeat it  M= make a mental note  2.   You can keep a Glass Blower/designer.  Use a 3-ring notebook with sections for the following: calendar, important names and phone numbers,  medications, doctors' names/phone numbers, lists/reminders, and a section to journal what you did  each day.   3.    Use a calendar to write appointments down.  4.    Write yourself a schedule for the day.  This can be placed on the calendar or in a separate section of the Memory Notebook.  Keeping a  regular schedule can help memory.  5.    Use medication organizer with sections for each day or morning/evening pills.  You may need help loading it  6.    Keep a basket, or pegboard by the door.  Place  items that you need to take out with you in the basket or on the pegboard.  You may also want to  include a message board for reminders.  7.    Use sticky notes.  Place sticky notes with reminders in a place where the task is performed.  For example:  turn off the  stove placed by the stove, lock the door placed on the door at eye level,  take your medications on  the bathroom mirror or by the place where you normally take your medications.  8.    Use alarms/timers.  Use while cooking to remind yourself to check on food or as a reminder to take your medicine, or as a  reminder to make a call, or as a reminder to perform another task, etc.

## 2024-07-22 LAB — LIPID PANEL
Chol/HDL Ratio: 3.7 ratio (ref 0.0–5.0)
Cholesterol, Total: 107 mg/dL (ref 100–199)
HDL: 29 mg/dL — ABNORMAL LOW (ref >39)
LDL Chol Calc (NIH): 49 mg/dL (ref 0–99)
Triglycerides: 172 mg/dL — ABNORMAL HIGH (ref 0–149)
VLDL Cholesterol Cal: 29 mg/dL (ref 5–40)

## 2024-08-04 ENCOUNTER — Ambulatory Visit: Payer: Self-pay | Admitting: Neurology

## 2025-02-17 ENCOUNTER — Ambulatory Visit: Admitting: Adult Health
